# Patient Record
Sex: Male | Born: 1950
Health system: Southern US, Community
[De-identification: ages and names within clinical notes are randomized; demographics above are authoritative.]

## PROBLEM LIST (undated history)

## (undated) DIAGNOSIS — K3184 Gastroparesis: Secondary | ICD-10-CM

## (undated) DIAGNOSIS — K2270 Barrett's esophagus without dysplasia: Secondary | ICD-10-CM

## (undated) DIAGNOSIS — G919 Hydrocephalus, unspecified: Secondary | ICD-10-CM

## (undated) DIAGNOSIS — N183 Chronic kidney disease, stage 3 unspecified: Secondary | ICD-10-CM

## (undated) DIAGNOSIS — G20 Parkinson's disease: Secondary | ICD-10-CM

## (undated) DIAGNOSIS — Z8614 Personal history of Methicillin resistant Staphylococcus aureus infection: Secondary | ICD-10-CM

## (undated) DIAGNOSIS — C159 Malignant neoplasm of esophagus, unspecified: Secondary | ICD-10-CM

## (undated) DIAGNOSIS — J189 Pneumonia, unspecified organism: Secondary | ICD-10-CM

## (undated) DIAGNOSIS — E119 Type 2 diabetes mellitus without complications: Secondary | ICD-10-CM

## (undated) DIAGNOSIS — F32A Depression, unspecified: Secondary | ICD-10-CM

## (undated) DIAGNOSIS — K219 Gastro-esophageal reflux disease without esophagitis: Secondary | ICD-10-CM

## (undated) DIAGNOSIS — D649 Anemia, unspecified: Secondary | ICD-10-CM

## (undated) DIAGNOSIS — F0390 Unspecified dementia without behavioral disturbance: Secondary | ICD-10-CM

## (undated) DIAGNOSIS — G4730 Sleep apnea, unspecified: Secondary | ICD-10-CM

## (undated) DIAGNOSIS — G20A1 Parkinson's disease without dyskinesia, without mention of fluctuations: Secondary | ICD-10-CM

## (undated) DIAGNOSIS — I10 Essential (primary) hypertension: Secondary | ICD-10-CM

## (undated) HISTORY — DX: Chronic kidney disease, stage 3 (moderate): N18.3

## (undated) HISTORY — DX: Essential (primary) hypertension: I10

## (undated) HISTORY — DX: Type 2 diabetes mellitus without complications: E11.9

## (undated) HISTORY — DX: Chronic kidney disease, stage 3 unspecified: N18.30

## (undated) HISTORY — DX: Personal history of Methicillin resistant Staphylococcus aureus infection: Z86.14

## (undated) HISTORY — DX: Barrett's esophagus without dysplasia: K22.70

## (undated) HISTORY — DX: Gastroparesis: K31.84

## (undated) HISTORY — DX: Malignant neoplasm of esophagus, unspecified: C15.9

## (undated) HISTORY — DX: Gastro-esophageal reflux disease without esophagitis: K21.9

## (undated) HISTORY — PX: OTHER SURGICAL HISTORY: SHX169

---

## 1998-03-15 ENCOUNTER — Encounter: Admission: RE | Admit: 1998-03-15 | Discharge: 1998-06-09 | Payer: Self-pay | Admitting: Internal Medicine

## 1998-03-16 ENCOUNTER — Encounter: Payer: Self-pay | Admitting: Endocrinology

## 1998-03-16 ENCOUNTER — Ambulatory Visit (HOSPITAL_COMMUNITY): Admission: RE | Admit: 1998-03-16 | Discharge: 1998-03-16 | Payer: Self-pay | Admitting: Endocrinology

## 1998-03-17 ENCOUNTER — Ambulatory Visit (HOSPITAL_COMMUNITY): Admission: RE | Admit: 1998-03-17 | Discharge: 1998-03-17 | Payer: Self-pay | Admitting: Endocrinology

## 1998-03-17 ENCOUNTER — Encounter: Payer: Self-pay | Admitting: Endocrinology

## 1998-09-06 ENCOUNTER — Encounter: Admission: RE | Admit: 1998-09-06 | Discharge: 1998-12-05 | Payer: Self-pay | Admitting: Internal Medicine

## 1998-09-20 ENCOUNTER — Ambulatory Visit (HOSPITAL_COMMUNITY): Admission: RE | Admit: 1998-09-20 | Discharge: 1998-09-20 | Payer: Self-pay | Admitting: *Deleted

## 1999-01-19 ENCOUNTER — Encounter: Admission: RE | Admit: 1999-01-19 | Discharge: 1999-04-19 | Payer: Self-pay | Admitting: Internal Medicine

## 1999-04-24 ENCOUNTER — Encounter: Admission: RE | Admit: 1999-04-24 | Discharge: 1999-07-23 | Payer: Self-pay | Admitting: Internal Medicine

## 1999-05-10 ENCOUNTER — Ambulatory Visit (HOSPITAL_COMMUNITY): Admission: RE | Admit: 1999-05-10 | Discharge: 1999-05-10 | Payer: Self-pay | Admitting: Cardiology

## 1999-06-05 ENCOUNTER — Encounter: Admission: RE | Admit: 1999-06-05 | Discharge: 1999-09-03 | Payer: Self-pay | Admitting: Orthopedic Surgery

## 1999-06-18 ENCOUNTER — Encounter: Admission: RE | Admit: 1999-06-18 | Discharge: 1999-09-16 | Payer: Self-pay | Admitting: Endocrinology

## 1999-08-27 ENCOUNTER — Encounter: Admission: RE | Admit: 1999-08-27 | Discharge: 1999-11-25 | Payer: Self-pay | Admitting: Orthopedic Surgery

## 1999-12-25 ENCOUNTER — Encounter: Admission: RE | Admit: 1999-12-25 | Discharge: 2000-03-24 | Payer: Self-pay | Admitting: Orthopedic Surgery

## 2000-04-01 ENCOUNTER — Encounter: Admission: RE | Admit: 2000-04-01 | Discharge: 2000-06-06 | Payer: Self-pay | Admitting: Orthopedic Surgery

## 2000-06-04 ENCOUNTER — Encounter: Admission: RE | Admit: 2000-06-04 | Discharge: 2000-08-25 | Payer: Self-pay | Admitting: Orthopedic Surgery

## 2000-09-10 ENCOUNTER — Encounter: Admission: RE | Admit: 2000-09-10 | Discharge: 2000-12-09 | Payer: Self-pay | Admitting: Internal Medicine

## 2000-09-10 ENCOUNTER — Encounter (HOSPITAL_BASED_OUTPATIENT_CLINIC_OR_DEPARTMENT_OTHER): Payer: Self-pay | Admitting: Internal Medicine

## 2001-02-18 ENCOUNTER — Encounter: Admission: RE | Admit: 2001-02-18 | Discharge: 2001-05-04 | Payer: Self-pay | Admitting: Internal Medicine

## 2001-05-21 ENCOUNTER — Encounter: Admission: RE | Admit: 2001-05-21 | Discharge: 2001-08-10 | Payer: Self-pay | Admitting: Orthopedic Surgery

## 2001-07-02 ENCOUNTER — Encounter: Payer: Self-pay | Admitting: Orthopedic Surgery

## 2001-07-30 ENCOUNTER — Encounter (HOSPITAL_BASED_OUTPATIENT_CLINIC_OR_DEPARTMENT_OTHER): Payer: Self-pay | Admitting: Internal Medicine

## 2001-08-25 ENCOUNTER — Encounter (HOSPITAL_BASED_OUTPATIENT_CLINIC_OR_DEPARTMENT_OTHER): Admission: RE | Admit: 2001-08-25 | Discharge: 2001-11-23 | Payer: Self-pay | Admitting: Orthopedic Surgery

## 2002-01-01 ENCOUNTER — Encounter (HOSPITAL_BASED_OUTPATIENT_CLINIC_OR_DEPARTMENT_OTHER): Admission: RE | Admit: 2002-01-01 | Discharge: 2002-04-01 | Payer: Self-pay | Admitting: Internal Medicine

## 2002-04-05 ENCOUNTER — Encounter (HOSPITAL_BASED_OUTPATIENT_CLINIC_OR_DEPARTMENT_OTHER): Admission: RE | Admit: 2002-04-05 | Discharge: 2002-07-04 | Payer: Self-pay | Admitting: Internal Medicine

## 2002-06-03 ENCOUNTER — Ambulatory Visit (HOSPITAL_BASED_OUTPATIENT_CLINIC_OR_DEPARTMENT_OTHER): Admission: RE | Admit: 2002-06-03 | Discharge: 2002-06-03 | Payer: Self-pay | Admitting: Orthopedic Surgery

## 2002-08-02 ENCOUNTER — Encounter (HOSPITAL_BASED_OUTPATIENT_CLINIC_OR_DEPARTMENT_OTHER): Admission: RE | Admit: 2002-08-02 | Discharge: 2002-10-31 | Payer: Self-pay | Admitting: Internal Medicine

## 2002-11-03 ENCOUNTER — Encounter (HOSPITAL_BASED_OUTPATIENT_CLINIC_OR_DEPARTMENT_OTHER): Admission: RE | Admit: 2002-11-03 | Discharge: 2003-02-01 | Payer: Self-pay | Admitting: Internal Medicine

## 2003-02-04 ENCOUNTER — Encounter (HOSPITAL_BASED_OUTPATIENT_CLINIC_OR_DEPARTMENT_OTHER): Admission: RE | Admit: 2003-02-04 | Discharge: 2003-02-15 | Payer: Self-pay | Admitting: Internal Medicine

## 2003-05-05 ENCOUNTER — Encounter (HOSPITAL_BASED_OUTPATIENT_CLINIC_OR_DEPARTMENT_OTHER): Admission: RE | Admit: 2003-05-05 | Discharge: 2003-05-20 | Payer: Self-pay | Admitting: Internal Medicine

## 2003-08-04 ENCOUNTER — Encounter (HOSPITAL_BASED_OUTPATIENT_CLINIC_OR_DEPARTMENT_OTHER): Admission: RE | Admit: 2003-08-04 | Discharge: 2003-08-11 | Payer: Self-pay | Admitting: Internal Medicine

## 2003-11-01 ENCOUNTER — Encounter (HOSPITAL_BASED_OUTPATIENT_CLINIC_OR_DEPARTMENT_OTHER): Admission: RE | Admit: 2003-11-01 | Discharge: 2003-11-10 | Payer: Self-pay | Admitting: Internal Medicine

## 2003-12-07 ENCOUNTER — Encounter (HOSPITAL_BASED_OUTPATIENT_CLINIC_OR_DEPARTMENT_OTHER): Admission: RE | Admit: 2003-12-07 | Discharge: 2004-03-06 | Payer: Self-pay | Admitting: Internal Medicine

## 2004-02-07 ENCOUNTER — Ambulatory Visit (HOSPITAL_COMMUNITY): Admission: RE | Admit: 2004-02-07 | Discharge: 2004-02-07 | Payer: Self-pay

## 2004-02-23 ENCOUNTER — Encounter: Admission: RE | Admit: 2004-02-23 | Discharge: 2004-02-23 | Payer: Self-pay | Admitting: *Deleted

## 2004-03-27 ENCOUNTER — Encounter (INDEPENDENT_AMBULATORY_CARE_PROVIDER_SITE_OTHER): Payer: Self-pay | Admitting: *Deleted

## 2004-03-27 ENCOUNTER — Ambulatory Visit (HOSPITAL_COMMUNITY): Admission: RE | Admit: 2004-03-27 | Discharge: 2004-03-27 | Payer: Self-pay | Admitting: *Deleted

## 2004-04-17 ENCOUNTER — Encounter (HOSPITAL_BASED_OUTPATIENT_CLINIC_OR_DEPARTMENT_OTHER): Admission: RE | Admit: 2004-04-17 | Discharge: 2004-06-04 | Payer: Self-pay | Admitting: Internal Medicine

## 2004-04-18 ENCOUNTER — Ambulatory Visit (HOSPITAL_COMMUNITY): Admission: RE | Admit: 2004-04-18 | Discharge: 2004-04-18 | Payer: Self-pay | Admitting: Thoracic Surgery

## 2004-04-24 ENCOUNTER — Ambulatory Visit (HOSPITAL_COMMUNITY): Admission: RE | Admit: 2004-04-24 | Discharge: 2004-04-24 | Payer: Self-pay | Admitting: Thoracic Surgery

## 2004-04-29 ENCOUNTER — Ambulatory Visit: Payer: Self-pay | Admitting: Pulmonary Disease

## 2004-04-29 ENCOUNTER — Inpatient Hospital Stay (HOSPITAL_COMMUNITY): Admission: RE | Admit: 2004-04-29 | Discharge: 2004-05-14 | Payer: Self-pay | Admitting: Thoracic Surgery

## 2004-04-29 ENCOUNTER — Ambulatory Visit: Payer: Self-pay | Admitting: Internal Medicine

## 2004-04-30 ENCOUNTER — Encounter (INDEPENDENT_AMBULATORY_CARE_PROVIDER_SITE_OTHER): Payer: Self-pay | Admitting: *Deleted

## 2004-05-07 ENCOUNTER — Ambulatory Visit: Payer: Self-pay | Admitting: Internal Medicine

## 2004-05-21 ENCOUNTER — Emergency Department (HOSPITAL_COMMUNITY): Admission: EM | Admit: 2004-05-21 | Discharge: 2004-05-21 | Payer: Self-pay | Admitting: Emergency Medicine

## 2004-05-23 ENCOUNTER — Encounter: Admission: RE | Admit: 2004-05-23 | Discharge: 2004-05-23 | Payer: Self-pay | Admitting: Thoracic Surgery

## 2004-05-28 ENCOUNTER — Emergency Department (HOSPITAL_COMMUNITY): Admission: EM | Admit: 2004-05-28 | Discharge: 2004-05-28 | Payer: Self-pay | Admitting: Emergency Medicine

## 2004-06-27 ENCOUNTER — Encounter: Admission: RE | Admit: 2004-06-27 | Discharge: 2004-06-27 | Payer: Self-pay | Admitting: Thoracic Surgery

## 2004-08-24 ENCOUNTER — Encounter (HOSPITAL_BASED_OUTPATIENT_CLINIC_OR_DEPARTMENT_OTHER): Admission: RE | Admit: 2004-08-24 | Discharge: 2004-11-22 | Payer: Self-pay | Admitting: Surgery

## 2004-08-28 ENCOUNTER — Encounter
Admission: RE | Admit: 2004-08-28 | Discharge: 2004-08-28 | Payer: Self-pay | Admitting: Thoracic Surgery (Cardiothoracic Vascular Surgery)

## 2004-09-25 ENCOUNTER — Encounter (INDEPENDENT_AMBULATORY_CARE_PROVIDER_SITE_OTHER): Payer: Self-pay | Admitting: Specialist

## 2004-09-25 ENCOUNTER — Ambulatory Visit (HOSPITAL_COMMUNITY): Admission: RE | Admit: 2004-09-25 | Discharge: 2004-09-25 | Payer: Self-pay | Admitting: *Deleted

## 2004-09-28 ENCOUNTER — Ambulatory Visit: Payer: Self-pay | Admitting: Internal Medicine

## 2004-10-03 ENCOUNTER — Ambulatory Visit (HOSPITAL_COMMUNITY): Admission: RE | Admit: 2004-10-03 | Discharge: 2004-10-03 | Payer: Self-pay | Admitting: Internal Medicine

## 2004-11-30 ENCOUNTER — Encounter (HOSPITAL_BASED_OUTPATIENT_CLINIC_OR_DEPARTMENT_OTHER): Admission: RE | Admit: 2004-11-30 | Discharge: 2005-02-28 | Payer: Self-pay | Admitting: Surgery

## 2004-12-07 ENCOUNTER — Ambulatory Visit: Payer: Self-pay | Admitting: Internal Medicine

## 2004-12-10 ENCOUNTER — Ambulatory Visit (HOSPITAL_COMMUNITY): Admission: RE | Admit: 2004-12-10 | Discharge: 2004-12-10 | Payer: Self-pay | Admitting: Internal Medicine

## 2004-12-25 ENCOUNTER — Encounter: Admission: RE | Admit: 2004-12-25 | Discharge: 2004-12-25 | Payer: Self-pay | Admitting: Thoracic Surgery

## 2005-05-07 ENCOUNTER — Ambulatory Visit (HOSPITAL_COMMUNITY): Admission: RE | Admit: 2005-05-07 | Discharge: 2005-05-07 | Payer: Self-pay | Admitting: *Deleted

## 2005-05-07 ENCOUNTER — Encounter (INDEPENDENT_AMBULATORY_CARE_PROVIDER_SITE_OTHER): Payer: Self-pay | Admitting: *Deleted

## 2005-06-07 ENCOUNTER — Ambulatory Visit: Payer: Self-pay

## 2005-06-10 ENCOUNTER — Ambulatory Visit (HOSPITAL_COMMUNITY): Admission: RE | Admit: 2005-06-10 | Discharge: 2005-06-10 | Payer: Self-pay | Admitting: Internal Medicine

## 2005-06-10 ENCOUNTER — Ambulatory Visit: Payer: Self-pay | Admitting: Internal Medicine

## 2005-07-01 IMAGING — CR DG CHEST 1V PORT
1 series · 1 of 1 positions shown · non-contrast
Comparison: 2 view chest x-ray 05/23/2004.

CLINICAL DATA: History of esophageal cancer. The fever, chest pain.

PORTABLE CHEST - 1 VIEW  [DATE]/0442 [id number] hours:

[view not recorded]
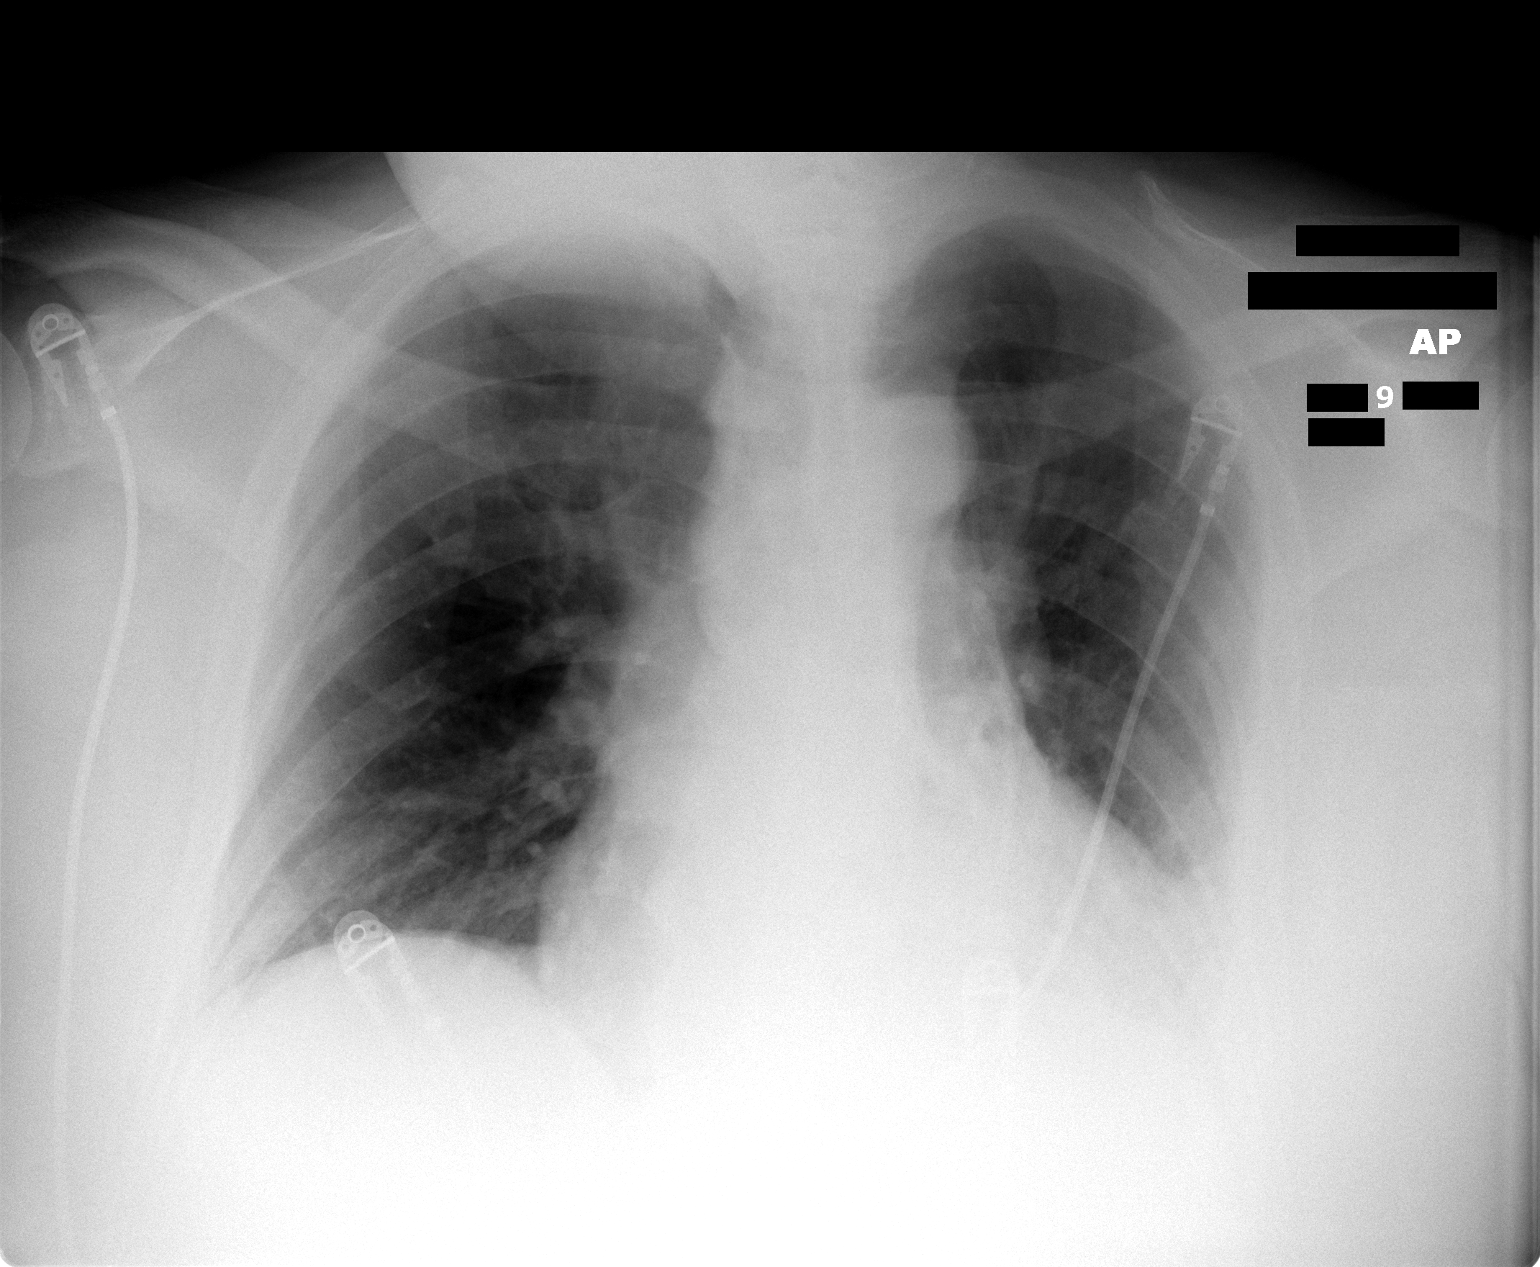

[1 of 1 positions shown; findings below may reference images not displayed]

FINDINGS: Again demonstrated and unchanged are the pleural and parenchymal
scarring in the left base. The lungs remain clear otherwise. The heart size is
upper normal and is accentuated by technique.
IMPRESSION: No evidence of acute disease.

## 2005-07-03 ENCOUNTER — Encounter: Admission: RE | Admit: 2005-07-03 | Discharge: 2005-07-03 | Payer: Self-pay | Admitting: Thoracic Surgery

## 2005-12-02 ENCOUNTER — Ambulatory Visit: Payer: Self-pay | Admitting: Internal Medicine

## 2005-12-02 LAB — CBC WITH DIFFERENTIAL/PLATELET
Basophils Absolute: 0 10*3/uL (ref 0.0–0.1)
Eosinophils Absolute: 0.3 10*3/uL (ref 0.0–0.5)
HCT: 37.7 % — ABNORMAL LOW (ref 38.7–49.9)
LYMPH%: 23.6 % (ref 14.0–48.0)
MCV: 86.9 fL (ref 81.6–98.0)
MONO#: 0.8 10*3/uL (ref 0.1–0.9)
MONO%: 9.4 % (ref 0.0–13.0)
NEUT#: 5.6 10*3/uL (ref 1.5–6.5)
NEUT%: 63.7 % (ref 40.0–75.0)
Platelets: 241 10*3/uL (ref 145–400)
RBC: 4.34 10*6/uL (ref 4.20–5.71)
WBC: 8.9 10*3/uL (ref 4.0–10.0)

## 2005-12-02 LAB — COMPREHENSIVE METABOLIC PANEL
CO2: 30 mEq/L (ref 19–32)
Glucose, Bld: 149 mg/dL — ABNORMAL HIGH (ref 70–99)
Sodium: 140 mEq/L (ref 135–145)
Total Bilirubin: 0.5 mg/dL (ref 0.3–1.2)
Total Protein: 7.2 g/dL (ref 6.0–8.3)

## 2005-12-02 LAB — LACTATE DEHYDROGENASE: LDH: 153 U/L (ref 94–250)

## 2005-12-04 ENCOUNTER — Ambulatory Visit: Admission: RE | Admit: 2005-12-04 | Discharge: 2005-12-04 | Payer: Self-pay | Admitting: Internal Medicine

## 2006-01-15 ENCOUNTER — Encounter: Admission: RE | Admit: 2006-01-15 | Discharge: 2006-01-15 | Payer: Self-pay | Admitting: Thoracic Surgery

## 2006-05-29 ENCOUNTER — Ambulatory Visit (HOSPITAL_COMMUNITY): Admission: RE | Admit: 2006-05-29 | Discharge: 2006-05-29 | Payer: Self-pay | Admitting: *Deleted

## 2006-05-29 ENCOUNTER — Encounter (INDEPENDENT_AMBULATORY_CARE_PROVIDER_SITE_OTHER): Payer: Self-pay | Admitting: *Deleted

## 2006-07-15 ENCOUNTER — Encounter: Admission: RE | Admit: 2006-07-15 | Discharge: 2006-07-15 | Payer: Self-pay | Admitting: Thoracic Surgery

## 2006-07-15 ENCOUNTER — Ambulatory Visit: Payer: Self-pay | Admitting: Thoracic Surgery

## 2006-07-26 ENCOUNTER — Emergency Department (HOSPITAL_COMMUNITY): Admission: EM | Admit: 2006-07-26 | Discharge: 2006-07-26 | Payer: Self-pay | Admitting: Emergency Medicine

## 2006-11-25 ENCOUNTER — Ambulatory Visit: Payer: Self-pay | Admitting: Internal Medicine

## 2006-11-27 LAB — COMPREHENSIVE METABOLIC PANEL
ALT: 12 U/L (ref 0–53)
Albumin: 4.2 g/dL (ref 3.5–5.2)
CO2: 29 mEq/L (ref 19–32)
Calcium: 9 mg/dL (ref 8.4–10.5)
Chloride: 102 mEq/L (ref 96–112)
Potassium: 4.5 mEq/L (ref 3.5–5.3)
Sodium: 139 mEq/L (ref 135–145)
Total Protein: 7.1 g/dL (ref 6.0–8.3)

## 2006-11-27 LAB — CBC WITH DIFFERENTIAL/PLATELET
BASO%: 0.3 % (ref 0.0–2.0)
HCT: 34.9 % — ABNORMAL LOW (ref 38.7–49.9)
MCHC: 34.4 g/dL (ref 32.0–35.9)
MONO#: 0.8 10*3/uL (ref 0.1–0.9)
NEUT%: 69.4 % (ref 40.0–75.0)
RBC: 4.03 10*6/uL — ABNORMAL LOW (ref 4.20–5.71)
WBC: 8.8 10*3/uL (ref 4.0–10.0)
lymph#: 1.7 10*3/uL (ref 0.9–3.3)

## 2006-11-27 LAB — CEA: CEA: 1.8 ng/mL (ref 0.0–5.0)

## 2006-12-01 ENCOUNTER — Ambulatory Visit (HOSPITAL_COMMUNITY): Admission: RE | Admit: 2006-12-01 | Discharge: 2006-12-01 | Payer: Self-pay | Admitting: Internal Medicine

## 2006-12-03 ENCOUNTER — Ambulatory Visit: Payer: Self-pay | Admitting: Thoracic Surgery

## 2007-02-18 ENCOUNTER — Ambulatory Visit (HOSPITAL_COMMUNITY): Admission: RE | Admit: 2007-02-18 | Discharge: 2007-02-18 | Payer: Self-pay | Admitting: *Deleted

## 2007-03-21 ENCOUNTER — Emergency Department (HOSPITAL_COMMUNITY): Admission: EM | Admit: 2007-03-21 | Discharge: 2007-03-21 | Payer: Self-pay | Admitting: Emergency Medicine

## 2007-03-24 ENCOUNTER — Emergency Department (HOSPITAL_COMMUNITY): Admission: EM | Admit: 2007-03-24 | Discharge: 2007-03-24 | Payer: Self-pay | Admitting: Emergency Medicine

## 2007-05-26 ENCOUNTER — Ambulatory Visit: Payer: Self-pay | Admitting: Internal Medicine

## 2007-05-28 LAB — CBC WITH DIFFERENTIAL/PLATELET
BASO%: 0.3 % (ref 0.0–2.0)
EOS%: 4.2 % (ref 0.0–7.0)
MCH: 29.3 pg (ref 28.0–33.4)
MCHC: 34 g/dL (ref 32.0–35.9)
MONO%: 9.5 % (ref 0.0–13.0)
NEUT%: 65.4 % (ref 40.0–75.0)
RDW: 14.8 % — ABNORMAL HIGH (ref 11.2–14.6)
lymph#: 1.6 10*3/uL (ref 0.9–3.3)

## 2007-05-28 LAB — COMPREHENSIVE METABOLIC PANEL
Albumin: 4.3 g/dL (ref 3.5–5.2)
CO2: 28 mEq/L (ref 19–32)
Calcium: 9.6 mg/dL (ref 8.4–10.5)
Glucose, Bld: 143 mg/dL — ABNORMAL HIGH (ref 70–99)
Potassium: 4.5 mEq/L (ref 3.5–5.3)
Sodium: 139 mEq/L (ref 135–145)
Total Protein: 7.4 g/dL (ref 6.0–8.3)

## 2007-05-28 LAB — CEA: CEA: 1.1 ng/mL (ref 0.0–5.0)

## 2007-05-29 ENCOUNTER — Ambulatory Visit (HOSPITAL_COMMUNITY): Admission: RE | Admit: 2007-05-29 | Discharge: 2007-05-29 | Payer: Self-pay | Admitting: Internal Medicine

## 2007-06-03 ENCOUNTER — Ambulatory Visit: Payer: Self-pay | Admitting: Thoracic Surgery

## 2007-06-03 ENCOUNTER — Encounter: Admission: RE | Admit: 2007-06-03 | Discharge: 2007-06-03 | Payer: Self-pay | Admitting: Thoracic Surgery

## 2007-07-10 ENCOUNTER — Encounter (INDEPENDENT_AMBULATORY_CARE_PROVIDER_SITE_OTHER): Payer: Self-pay | Admitting: *Deleted

## 2007-07-10 ENCOUNTER — Ambulatory Visit (HOSPITAL_COMMUNITY): Admission: RE | Admit: 2007-07-10 | Discharge: 2007-07-10 | Payer: Self-pay | Admitting: *Deleted

## 2007-07-14 ENCOUNTER — Encounter: Admission: RE | Admit: 2007-07-14 | Discharge: 2007-07-14 | Payer: Self-pay | Admitting: Endocrinology

## 2007-08-28 ENCOUNTER — Inpatient Hospital Stay (HOSPITAL_COMMUNITY): Admission: EM | Admit: 2007-08-28 | Discharge: 2007-09-01 | Payer: Self-pay | Admitting: Emergency Medicine

## 2007-09-03 ENCOUNTER — Ambulatory Visit: Payer: Self-pay | Admitting: Internal Medicine

## 2007-10-19 ENCOUNTER — Encounter: Admission: RE | Admit: 2007-10-19 | Discharge: 2007-10-19 | Payer: Self-pay | Admitting: Endocrinology

## 2008-05-27 ENCOUNTER — Ambulatory Visit: Payer: Self-pay | Admitting: Internal Medicine

## 2008-05-31 ENCOUNTER — Ambulatory Visit (HOSPITAL_COMMUNITY): Admission: RE | Admit: 2008-05-31 | Discharge: 2008-05-31 | Payer: Self-pay | Admitting: Internal Medicine

## 2008-05-31 LAB — COMPREHENSIVE METABOLIC PANEL
AST: 21 U/L (ref 0–37)
Albumin: 4.2 g/dL (ref 3.5–5.2)
Alkaline Phosphatase: 142 U/L — ABNORMAL HIGH (ref 39–117)
BUN: 23 mg/dL (ref 6–23)
Glucose, Bld: 115 mg/dL — ABNORMAL HIGH (ref 70–99)
Potassium: 4 mEq/L (ref 3.5–5.3)
Sodium: 136 mEq/L (ref 135–145)
Total Bilirubin: 0.9 mg/dL (ref 0.3–1.2)

## 2008-05-31 LAB — CBC WITH DIFFERENTIAL/PLATELET
Basophils Absolute: 0 10*3/uL (ref 0.0–0.1)
EOS%: 4.2 % (ref 0.0–7.0)
LYMPH%: 18.2 % (ref 14.0–48.0)
MCH: 29.6 pg (ref 28.0–33.4)
MCV: 88.3 fL (ref 81.6–98.0)
MONO%: 8.8 % (ref 0.0–13.0)
Platelets: 203 10*3/uL (ref 145–400)
RBC: 3.85 10*6/uL — ABNORMAL LOW (ref 4.20–5.71)
RDW: 14.6 % (ref 11.2–14.6)

## 2008-07-12 ENCOUNTER — Encounter (INDEPENDENT_AMBULATORY_CARE_PROVIDER_SITE_OTHER): Payer: Self-pay | Admitting: *Deleted

## 2008-07-12 ENCOUNTER — Ambulatory Visit (HOSPITAL_COMMUNITY): Admission: RE | Admit: 2008-07-12 | Discharge: 2008-07-12 | Payer: Self-pay | Admitting: *Deleted

## 2009-06-06 ENCOUNTER — Ambulatory Visit (HOSPITAL_COMMUNITY): Admission: RE | Admit: 2009-06-06 | Discharge: 2009-06-06 | Payer: Self-pay | Admitting: Internal Medicine

## 2009-06-06 ENCOUNTER — Ambulatory Visit: Payer: Self-pay | Admitting: Internal Medicine

## 2009-06-06 LAB — COMPREHENSIVE METABOLIC PANEL
ALT: 16 U/L (ref 0–53)
Alkaline Phosphatase: 124 U/L — ABNORMAL HIGH (ref 39–117)
BUN: 22 mg/dL (ref 6–23)
Chloride: 104 mEq/L (ref 96–112)
Creatinine, Ser: 1.35 mg/dL (ref 0.40–1.50)
Glucose, Bld: 132 mg/dL — ABNORMAL HIGH (ref 70–99)
Potassium: 4.1 mEq/L (ref 3.5–5.3)
Total Bilirubin: 1.2 mg/dL (ref 0.3–1.2)

## 2009-06-06 LAB — CBC WITH DIFFERENTIAL/PLATELET
BASO%: 0.4 % (ref 0.0–2.0)
EOS%: 4.7 % (ref 0.0–7.0)
HCT: 37.1 % — ABNORMAL LOW (ref 38.4–49.9)
HGB: 12.4 g/dL — ABNORMAL LOW (ref 13.0–17.1)
MCHC: 33.3 g/dL (ref 32.0–36.0)
MCV: 90 fL (ref 79.3–98.0)
NEUT#: 4.8 10*3/uL (ref 1.5–6.5)
Platelets: 193 10*3/uL (ref 140–400)
RBC: 4.12 10*6/uL — ABNORMAL LOW (ref 4.20–5.82)
lymph#: 1.5 10*3/uL (ref 0.9–3.3)

## 2009-06-06 LAB — CEA: CEA: 2.5 ng/mL (ref 0.0–5.0)

## 2010-05-24 ENCOUNTER — Encounter
Admission: RE | Admit: 2010-05-24 | Discharge: 2010-05-24 | Payer: Self-pay | Source: Home / Self Care | Attending: Gastroenterology | Admitting: Gastroenterology

## 2010-09-04 LAB — GLUCOSE, CAPILLARY: Glucose-Capillary: 149 mg/dL — ABNORMAL HIGH (ref 70–99)

## 2010-10-02 NOTE — Op Note (Signed)
NAME:  [name], [name] NO.:  [id number]   MEDICAL RECORD NO.:  [id number]          PATIENT TYPE:  INP   LOCATION:  [id number]                         FACILITY:  [facility]   PHYSICIAN:  Waverly Ferrari, M.D.    DATE OF BIRTH:  April 28, 1951   DATE OF PROCEDURE:  08/31/2007  DATE OF DISCHARGE:                               OPERATIVE REPORT   PROCEDURE:  Colonoscopy.   INDICATIONS:  Weight loss.   ANESTHESIA:  Demerol 70 mg, Versed 10 mg.   PROCEDURE:  With the patient mildly sedated in the left lateral  decubitus position, a rectal exam was performed.  Prostate was normal.  Subsequently, the Pentax videoscopic colonoscope was inserted in the  rectum and passed under direct vision to the cecum, identified by  ileocecal valve and appendiceal orifice both of which were photographed  from this point.  Colonoscope was slowly withdrawn taking  circumferential views of colonic mucosa stopping only in the rectum,  which appeared normal on direct and showed hemorrhoids on retroflexed  view.  The endoscope was straightened and withdrawn.  The patient's  vital signs, pulse oximeter remained stable.  The patient tolerated the  procedure well without any apparent complications.   FINDINGS:  Internal hemorrhoids.  Otherwise, an unremarkable  examination.  Etiology of weight loss not apparent from this exam.   PLAN:  Have patient follow up with me on an as needed basis as an  outpatient.           ______________________________  Waverly Ferrari, M.D.     GMO/MEDQ  D:  08/31/2007  T:  09/01/2007  Job:  [id number]

## 2010-10-02 NOTE — Consult Note (Signed)
NAME:  [name], [name] NO.:  [id number]   MEDICAL RECORD NO.:  [id number]          PATIENT TYPE:  INP   LOCATION:  [id number]                         FACILITY:  [facility]   PHYSICIAN:  Imogene Burn. Georgette Dover, M.D. DATE OF BIRTH:  06/27/1950   DATE OF CONSULTATION:  08/28/2007  DATE OF DISCHARGE:                                 CONSULTATION   REASON FOR CONSULTATION:  Left abdominal wall abscess.   The patient is a 60 year old male with a history of esophageal cancer,  who presents with poor appetite, weight loss, constipation, and  abdominal wall tenderness for the last 3-4 days.  The patient was  evaluated by the emergency department and a CT scan showed a left  abdominal wall abscess.  Incision and incision and drainage was  performed by Dr. Mayra Neer of the emergency department.  The  patient states that the wound feels better.  We are asked to assist with  further wound management.   PAST MEDICAL HISTORY:  1. Esophageal adenocarcinoma status post esophagogastrectomy,      jejunostomy, pyloroplasty by Dr. Kathrin Penner status post recent      EGD by Dr. Lajoyce Corners.  2. Gastroesophageal reflux, Barrett's esophagus.  3. Type 2 diabetes.  4. Hypertension.  5. History of MRSA.   ALLERGIES:  FENTANYL and MORPHINE.   MEDICATIONS:  Aspirin, Januvia,  Centrum, multivitamin daily, Nexium,  Norvasc, metformin, Accuretic, glipizide, Reglan, Darvocet, Zoloft,  Actos.   SOCIAL HISTORY:  Nonsmoker, nondrinker.   FAMILY HISTORY:  Mother is deceased from metastatic breast carcinoma.  Father deceased coronary artery disease.   PHYSICAL EXAMINATION:  VITAL SIGNS:  Current temperature 98.1, pulse 78,  respirations 20, blood pressure 136/79, sats 96% on room air.  GENERAL:  A well-developed, well-nourished male in no apparent distress.  HEENT:  EOMI.  Sclerae anicteric.  NECK:  No masses, no thyromegaly.  LUNGS:  Clear to auscultation bilaterally.  No respiratory effort.  HEART:   Regular rate and rhythm.  No murmur.  ABDOMEN:  Well-healed midline incision.  The left lower anterior  abdominal wall shows an abscess which has been drained.  The opening is  only about 7 mm long.  There is some purulent fluid coming out.  The  patient states that the tenderness is much improved.  He has good bowel  sounds.  EXTREMITIES:  No edema.  SKIN:  Warm, dry with no sign of jaundice.   LABORATORY DATA:  White count 10.3.  Electrolytes within normal limits.   IMPRESSION:  Left lower abdominal wall abscess drained by emergency  department.  Cultures pending.  The wound opening is fairly small, so  there is a risk that it may some close prematurely and the patient may  have recurrence of his symptoms.  At this point, we will observe this  and do daily packing changes.  Hopefully, this will allow the wound to  granulate and heal from inside out.  Continue antibiotics.  Other  medical issue has been worked up by his primary team.      Imogene Burn. Tsuei, M.D.  Electronically Signed  MKT/MEDQ  D:  08/28/2007  T:  08/29/2007  Job:  [id number]

## 2010-10-02 NOTE — Discharge Summary (Signed)
NAME:  [name], [name] NO.:  [id number]   MEDICAL RECORD NO.:  [id number]          PATIENT TYPE:  INP   LOCATION:  5507                         FACILITY:  [facility]   PHYSICIAN:  [name], D.O.    DATE OF BIRTH:  July 04, 1950   DATE OF ADMISSION:  08/27/2007  DATE OF DISCHARGE:  09/01/2007                               DISCHARGE SUMMARY   PRIMARY CARE PHYSICIAN:  W.D. Wilson Singer, MD.   PRIMARY ONCOLOGIST:  Ronald Bien. Julien Mccarthy, [name] SURGEON:  Ronald Alcon, MD.   GASTROENTEROLOGIST:  Ronald Ferrari, MD.   FINAL DIAGNOSES:  1. Abdominal wall abscess, status post incision and drainage, and      followup care by general surgery with recommendations to follow up      with [name] in 2 weeks and followup with [name] within      1-2 weeks as well and to undergo daily dressing changes as      instruction while he was in the hospital.  2. Weight loss, status post colonoscopy by Dr. Lajoyce Mccarthy.  Findings      revealing internal hemorrhoids, otherwise unremarkable bowel      examination.  No explanation for weight loss from current exam.  3. Diabetes is uncontrolled.  His hemoglobin A1c is 6.6.  He can      undergo further optimization of his medical management through his      primary [name]  Dr. Wilson Mccarthy.  We will continue his current regimen.  4. Hypertension.  5. Status post esophagectomy secondary to cancer.   DISCHARGE MEDICATIONS:  He will continue his home medications as per  list.  He provided on his own account:  1. Accuretic 20/12.5 daily.  2. Aspirin 81 mg daily.  3. Darvocet-N 100 q.6 p.r.n.  4. Glipizide 10 mg daily.  5. Metformin 1200 mg twice daily.  6. Metoclopramide 10 mg 1 tablet t.i.d.  7. Multivitamin daily.  8. Nexium 40 mg twice a day.  9. Norvasc 0.5 mg daily.  10.Phenergan 12.5 mg p.r.n. q.4 h. nausea.  11.Zoloft 100 mg q.a.m.  12.Actos 30 mg daily.  13.Doxycycline 100 mg twice daily, dispensed #28.   DISPOSITION:  Mr.  Mccarthy is in medically stable and an improved  condition after followup with [name] of general surgery in 1-2 weeks  for wound care followup and [name] in 1-2 weeks as well.   CONSULTATIONS ON THIS ADMISSION:  Ronald Desanctis, MD, of Gastroenterology.   PROCEDURE PERFORMED:  Colonoscopy; for full details, please refer to the  report.   IMAGING STUDIES:  CT of his chest revealed:  1. No evidence of recurrence.  2. Stable appearance of gastric pull-through.  No mediastinal or hilar      adenopathy.  3. Stable areas of scarring, particularly in the anterior left lobe.   Abdomen and pelvis revealed soft tissue abscess localized in the left  anterior abdominal wall subcutaneous tissue, no involvement of the  underlying musculature, no intra-abdominal fluid.   LABORATORY DATA:  Laboratory data revealed a vancomycin level of 24.2.  His  vancomycin is being discontinued.  He can undergo followup through  his primary care physician __________ basic metabolic panel.  It is  anticipated that without further treatment with the vancomycin, this  level should normalize.   MOST RECENT LAB DATA:  Sodium 135, potassium 3.7, BUN 8, and creatinine  0.98.   CULTURE RESULTS ON THE ABSCESS:  Coag-negative staph was revealed.   HISTORY OF PRESENT ILLNESS:  For full details, please refer to the H&P  as dictated by Dr. Sherryl Mccarthy,  however, briefly, [name] is  a pleasant 60 year old male with a prior history of esophageal  adenocarcinoma, T1 N0 diagnosed in December 2005, status post  esophagogastrectomy who was seen by his primary care physician with  abdominal wall pain.  He went to the emergency department, was  discovered to have a left anterior abdominal wall abscess.  This was  incised and drained in the emergency department where he was admitted  for further management.   HOSPITAL COURSE:  He underwent workup and underwent imaging studies as  described above with the  findings as described above.  Also,  [name] Surgery consulted for wound care followup of his abscess and  daily packing and dressing changes were continued.  It was recommended  that he undergo 2 weeks of outpatient antibiotics.  He was also asked to  follow up with Wound Care check by [name] in the outpatient setting.  He was instructed to call or request his PCP to coordinate these  followups.      [name], D.O.  Electronically Signed     ESS/MEDQ  D:  09/01/2007  T:  09/02/2007  Job:  [id number]

## 2010-10-02 NOTE — Op Note (Signed)
NAME:  [name], MILUM NO.:  [id number]   MEDICAL RECORD NO.:  [id number]          PATIENT TYPE:  AMB   LOCATION:  ENDO                         FACILITY:  [facility]   PHYSICIAN:  Waverly Ferrari, M.D.    DATE OF BIRTH:  Jul 02, 1950   DATE OF PROCEDURE:  07/10/2007  DATE OF DISCHARGE:                               OPERATIVE REPORT   PROCEDURE:  Upper endoscopy.   INDICATIONS:  Gastroesophageal reflux disease with Barrett's esophagus  and previous adenocarcinoma of the esophagus.   ANESTHESIA:  Demerol 70 mg and Versed 7.5 mg.   DESCRIPTION OF PROCEDURE:  With the patient mildly sedated in the left  lateral decubitus position, the Pentax videoscopic endoscope was  inserted in the mouth and passed under direct vision through the  esophagus which appeared normal.  There was no clear cut evidence of  Barrett's esophagus but there was a rim of red tissue that I elected to  biopsy at the squamocolumnar junction.  This was photographed and  biopsied.  We entered into the stomach.  The fundus, body, antrum,  duodenal bulb, and second portion of the duodenum were visualized. From  this point, the endoscope was slowly withdrawn taking circumferential  views of the duodenal mucosa until the endoscope had been pulled back  into the stomach and placed in retroflexion to view the stomach from  below. The endoscope was then straightened and withdrawn taking  circumferential views of the remaining gastric and esophageal mucosa,  stopping in the antrum to biopsy some erythematous patches seen.  The  endoscope was withdrawn.  The patient's vital signs and pulse oximeter  remained stable.  The patient tolerated the procedure well without  apparent complications.   FINDINGS:  Mild erythema of the gastric antrum, biopsied, and biopsies  taken of the squamocolumnar junction.   PLAN:  Await biopsy reports.  The patient will call me for results and  follow up with me as an  outpatient.           ______________________________  Waverly Ferrari, M.D.     GMO/MEDQ  D:  07/10/2007  T:  07/10/2007  Job:  [id number]

## 2010-10-02 NOTE — Letter (Signed)
December 05, 2006   Eilleen Kempf, [address], [place] [address]   Re:  [name], [name]             DOB:  Apr 10, 1951   [name],   I saw the patient in the office today and he is doing well.  He has had  a recent CT that showed no evidence of recurrence.  He is now over 2-1/2  since his esophagogastrectomy.  His weight is stable and he is back to  full time employment.  We will see him again in 6 months with another CT  scan.   Sincerely,   Nicanor Alcon, M.D.  Electronically Signed   DPB/MEDQ  D:  12/05/2006  T:  12/06/2006  Job:  [id number]

## 2010-10-02 NOTE — Letter (Signed)
June 03, 2007   Eilleen Kempf, [address], [place] [address]   Re:  [name], [name]             DOB:  Apr 19, 1951   Dear Julien Nordmann:   I saw the patient in the office today.  He is now 3 years since his  surgery.  His recent CT scan is stable with no evidence of recurrence.  Chest x-ray today was also clear.  His blood pressure is 138/58, pulse  82, respirations 18, sats were 98%, weight was 285.  Overall, he is  doing well.  Since he is stable, I will just let you continue to follow  him, and I will be happy to see him again if there is any evidence of  recurrence.  I appreciate the opportunity of seeing the patient.   Nicanor Alcon, M.D.  Electronically Signed   DPB/MEDQ  D:  06/03/2007  T:  06/03/2007  Job:  [id number]   cc:   Waverly Ferrari, M.D.

## 2010-10-02 NOTE — Op Note (Signed)
NAME:  MUAD, KIRCH NO.:  [id number]   MEDICAL RECORD NO.:  [id number]          PATIENT TYPE:  AMB   LOCATION:  ENDO                         FACILITY:  [facility]   PHYSICIAN:  Waverly Ferrari, M.D.    DATE OF BIRTH:  [birth year]   DATE OF PROCEDURE:  DATE OF DISCHARGE:                               OPERATIVE REPORT   PROCEDURE:  Upper endoscopy.   INDICATIONS:  GERD with known Barrett's esophagus and previous  adenocarcinoma of the esophagus status post resection.   ANESTHESIA:  Demerol 70 mg, Versed 7.5 mg.   PROCEDURE:  With the patient mildly sedated in the left lateral  decubitus position the Pentax videoscopic endoscope was inserted in the  mouth and passed under direct vision through the esophagus which  appeared normal without any evidence of Barrett's noted.  We entered  into the stomach fundus, body, antrum, duodenal bulb, second portion  duodenum were visualized.  From this point the endoscope was slowly  withdrawn taking circumferential views of the duodenal mucosa until the  endoscope had been pulled back into stomach and placed in retroflexion  to view the stomach from below.  The endoscope was then straightened and  withdrawn taking circumferential views of the remaining gastric and  esophageal mucosa stopping to biopsy the distal esophagus at the  squamocolumnar junction.  The patient's vital signs and pulse oximeter  remained stable.  The patient tolerated the procedure well without  apparent complications.   FINDINGS:  Probably normal examination status post resection.   PLAN:  Await biopsy report.  The patient will call me for results and  follow-up with me as an outpatient.           ______________________________  Waverly Ferrari, M.D.     GMO/MEDQ  D:  07/12/2008  T:  07/12/2008  Job:  [id number]

## 2010-10-02 NOTE — H&P (Signed)
NAME:  [name], [name] NO.:  [id number]   MEDICAL RECORD NO.:  [id number]          PATIENT TYPE:  INP   LOCATION:  5507                         FACILITY:  [facility]   PHYSICIAN:  Sherryl Manges, M.D.  DATE OF BIRTH:  [birth year]   DATE OF ADMISSION:  08/27/2007  DATE OF DISCHARGE:                              HISTORY & PHYSICAL   PRIMARY PHYSICIAN:  Primary M.D: Dr. Wilson Singer.  Primary Oncologist: Dr. Eilleen Kempf.  Primary Cardiothoracic Surgeon: Dr. Marlyn Corporal.  Primary Gastroenterologist: Dr. Jim Desanctis.   CHIEF COMPLAINT:  Weight loss of approximately 60 pounds in the past 3  months, poor appetite and altered bowel habit, in the form of  constipation with occasional diarrhea. Also, anterior abdominal wall  pain for the past 3 to 4 days.  No pyrexia.   HISTORY OF PRESENT ILLNESS:  As above.  The patient saw his primary  M.D.,  Dr. Wilson Singer today, per scheduled appointment.  Complained of  abdominal wall pain, was evaluated and sent to the Emergency Department.  In addition, he informs me that Dr. Lajoyce Corners was planning to schedule a  colonoscopy soon to evaluate his weight loss.  On arrival in the  Emergency Department, I&D of left anterior abdominal wall abscess was  done by ED MD, then the patient was therefore referred to the medical  service for admission, treatment and further evaluation.   PAST MEDICAL HISTORY:  1. Esophageal adenocarcinoma (T1 N0 Mx) April 19, 2004, status post      esophagogastrectomy with jejunostomy and pyloroplasty.  Follows up      with Dr. Marlyn Corporal and Dr. Lajoyce Corners, now status post multiple upper      GI endoscopies, and subsequently declared disease-free after 3      years of follow-up.  2. GERD/Barrett's esophagus.  3. Type 2 diabetes mellitus.  4. Hypertension.  5. History of MRSA.   MEDICATION HISTORY.:  1. Aspirin 81 mg p.o. daily.  2. Januvia 100 mg p.o. daily.  3. Centrum multivitamin one p.o. daily.  4. Nexium 40 mg  p.o. b.i.d.  5. Norvasc 5 mg p.o. daily.  6. Metformin 500 mg p.o. b.i.d.  7. Accuretic (20/12.5) one p.o. daily.  8. Glipizide 10 mg p.o. daily.  9. Metoclopramide 10 mg p.o. t.i.d.  10.Darvocet N 100 one p.o. p.r.n. daily.  11.Zoloft 100 mg p.o. daily.  12.Actos 30 mg p.o. daily.   ALLERGIES:  FENTANYL, MORPHINE.   REVIEW OF SYSTEMS:  As per HPI and chief complaint, otherwise negative.   SOCIAL HISTORY:  The patient is married.  He works as a Company secretary.  Has  one son who is now deceased, after sustaining a gunshot wound at age 19  years.  Nonsmoker, nondrinker.  Has no history of drug abuse.   FAMILY HISTORY:  The patient's mother died of carcinoma of the breast  with metastases. Also his father is deceased.  He had coronary artery  disease and CHF at age 46 years.  Family history is otherwise  noncontributory.   PHYSICAL EXAMINATION:  VITALS:  Temperature maximum 101.1, pulse 75 per  minute regular,  respiratory rate 18, BP 112/78 mmHg, pulse oximeter 100%  on room air.  GENERAL:  The patient did not appear to be in obvious acute distress at  time of this evaluation, alert, communicative, not short of breath at  rest.  HEENT:  No clinical pallor, no jaundice.  No conjunctival injection.  Throat is clear.  NECK: Supple.  JVP not seen a palpable lymphadenopathy.  No palpable  goiter.  CHEST:  Clinically clear to auscultation.  No wheezes, no crackles.  CARDIOVASCULAR:  Heart sounds 1 and 2 are heard. Normal, regular, no  murmurs.  ABDOMEN: Midline laparotomy scar is noted.  The patient has dressing  with some sanguineous drainage on it over the left anterior abdominal  wall, i.e. site of I&D done in the emergency department.  There is  however, some perifocal redness and tenderness.  Abdomen otherwise,  appears soft.  Bowel sounds are heard.  No palpable organomegaly.  No  palpable masses.  EXTREMITIES:  Lower extremity examination, no pitting edema.  Palpable  peripheral  pulses.  MUSCULOSKELETAL  SYSTEM:  Unremarkable.  CENTRAL NERVOUS SYSTEM:  No focal neurologic deficit on gross  examination.   INVESTIGATIONS:  WBC 11.8, hemoglobin 12.8, hematocrit 38.4, platelets  229.  Electrolytes sodium 120, potassium 4.8, chloride 95.  CO2 21, BUN  21, creatinine 1.17, glucose 59, AST 25, ALT 21, alkaline phosphatase  93.  Abdominal CT scan dated August 27, 2007  shows soft tissue abscess  4.8 cm in diameter, left anterior abdominal wall subcutaneous tissues.  No involvement of underlying musculature no intra-abdominal fluid.   ASSESSMENT AND PLAN:  1. Left anterior abdominal wall abscess with cellulitis.  Now status      post I&D done by ED MD.  We shall admit the patient, administer      broad-spectrum antibiotic coverage with a combination of Vancomycin      and Zosyn, especially in view of known history of MRSA. We shall      await wound cultures.  Meanwhile do blood cultures for      completeness.   1. Type 2 diabetes mellitus.  This appears controlled.  However,      random blood glucose somewhat low at 59, and we shall therefore,      hold oral hypoglycemics, place the patient on a carbohydrate      modified diet and manage with sliding scale insulin coverage for      now.   1. History of weight loss.  The patient has lost approximately 60      pounds in weight over the past 3 months.  He has had multiple      unrevealing upper GI endoscopies done by Dr. Jim Desanctis and we      understand that a colonoscopy is planned, particularly against a      background of altered bowel habits.  We shall consult Dr. Lajoyce Corners to do      colonoscopy during this hospitalization. Abdominal CT scan however,      is unrevealing.   1. Hypertension.  This appears controlled.   1. Barrett's esophagus.  We shall continue twice-daily proton pump      inhibitor.   1. Hyponatremia.  This is likely secondary to the Hydrochlorothiazide      moiety of Accuretic. We shall discontinue  this and commence      intravenous infusion of normal saline, for now.   Further management will depend on clinical course.      Harrell Gave  Blenda Nicely, M.D.  Electronically Signed     CO/MEDQ  D:  08/28/2007  T:  08/28/2007  Job:  [id number]   cc:   Waverly Ferrari, M.D.  Nicanor Alcon, M.D.  Eilleen Kempf, MD

## 2010-10-05 NOTE — Op Note (Signed)
NAME:  [name], [name]                       ACCOUNT NO.:  [id number]   MEDICAL RECORD NO.:  [id number]                   PATIENT TYPE:  AMB   LOCATION:  [place]                                  FACILITY:  [facility]   PHYSICIAN:  Newt Minion, M.D.                DATE OF BIRTH:  01-04-1951   DATE OF PROCEDURE:  06/03/2002  DATE OF DISCHARGE:                                 OPERATIVE REPORT   PREOPERATIVE DIAGNOSIS:  Chronic ulcer, left second toe with hyperkeratotic  callous under the second metatarsal head with chronic clawing of the  proximal interphalangeal joint and prominent first metatarsal head.   PROCEDURES:  1. Proximal interphalangeal joint resection.  2. Flexor-to-extensor tendon transfer.  3. Weil osteotomy at the second metatarsal neck with internal fixation.   SURGEON:  Newt Minion, M.D.   ANESTHESIA:  General.   ESTIMATED BLOOD LOSS:  Minimal.   ANTIBIOTICS:  1 gram of Kefzol.   TOURNIQUET TIME:  Esmarch at the ankle for approximately 35 minutes.   DISPOSITION:  To PACU in stable condition.   INDICATIONS FOR PROCEDURE:  The patient is a 60 year old gentleman with type  2 diabetes who has had the chronic above-mentioned ulcerative problem with  the clawing of the toe.  The patient has failed conservative care including  pressure unloading shoe wear modification, antibiotics, and wound care  without relief and presents at this time for the above-mentioned procedures.  The risks and benefits were discussed including infection, neurovascular  injury, recurrence of the deformity, maldeformity of the second toe, need  for additional surgery.  The patient states he understands and wishes to  proceed at this time.   DESCRIPTION OF PROCEDURE:  The patient was brought to the outpatient OR and  underwent a general anesthetic.  After adequate level of anesthesia  obtained, the patient's left lower extremity was prepped using Duraprep and  draped in a sterile field.  A  dorsal incision was made over the PIP and MTP  joint of the left second toe.  Attention was first focused on the PIP joint.  Subperiosteal dissection was carried around the PIP joint and the PIP joint  was resected.  The flexor tendons were then tagged with 3-0 Ethibond and  then were freed to allow these tends to loop dorsally over the proximal  phalanx.  Attention was then focused at the MTP joint.  The MTP joint was  dissected subperiosteally.  The metatarsal head and neck that underwent a  Weil osteotomy cut with a very thin sliver of bone resected with a proximal  and dorsal displacement of the metatarsal head.  This was then held  stabilized with the medial right medical screw with a snap off head which  was 12 mm in length.  The wound was irrigated with normal saline.  The toe  was held extended and the long flexors were tied over the dorsum of  the  proximal phalanx.  The PIP joint was held stable with a 6-2 K wire.  The  wound was irrigated with normal saline.  The incision was closed using 2-0  nylon with a vertical mattress stitch.  The tourniquet was deflated after 35  minutes.  The wound was covered with Adaptic orthopedic sponges, sterile  Webril, and a loosely wrapped Coban dressing.  The patient  was extubated and taken to PACU in stable condition.  Plan for discharge to home, Darco postoperative shoe, crutches,  nonweightbearing, elevation, follow up in the foot clinic in two weeks to  remove the K wire.                                               Newt Minion, M.D.    MVD/MEDQ  D:  06/03/2002  T:  06/03/2002  Job:  [id number]

## 2010-10-05 NOTE — Op Note (Signed)
NAME:  [name], [name] NO.:  [id number]   MEDICAL RECORD NO.:  [id number]          PATIENT TYPE:  AMB   LOCATION:  ENDO                         FACILITY:  [facility]   PHYSICIAN:  Waverly Ferrari, M.D.    DATE OF BIRTH:  08-Sep-1950   DATE OF PROCEDURE:  05/07/2005  DATE OF DISCHARGE:                                 OPERATIVE REPORT   Please note copy.           ______________________________  Waverly Ferrari, M.D.     GMO/MEDQ  D:  05/07/2005  T:  05/08/2005  Job:  [id number]   cc:   Eilleen Kempf, MD  Fax: [phone]

## 2010-10-05 NOTE — Consult Note (Signed)
NAME:  [name], [name] NO.:  [id number]   MEDICAL RECORD NO.:  [id number]          PATIENT TYPE:  INP   LOCATION:  [id number]                         FACILITY:  [facility]   PHYSICIAN:  Eilleen Kempf, MD  DATE OF BIRTH:  August 07, 1950   DATE OF CONSULTATION:  05/04/2004  DATE OF DISCHARGE:                                   CONSULTATION   REFERRING PHYSICIAN:  Nicanor Alcon, M.D.   REASON FOR CONSULTATION:  A 60 year old African-American male diagnosed with  esophageal adenocarcinoma.   HISTORY:  [name] is a 60 year old African-American male with past  medical history significant for diabetes mellitus, morbid obesity,  hypertension, and GERD symptoms, who presented in September 2005 with  worsening GERD symptoms.  This led to upper endoscopy with a biopsy on  February 07, 2004, under the care of Dr. Lajoyce Corners, and it revealed high-grade  dysplasia.  Barium esophagogram on February 22, 2004, revealed a small,  irregular filling defect within the distal esophagus near the GE junction.  CT scan of the chest, abdomen, and pelvis performed on the same day showed  mild thickening of the distal esophagus with one small lymph node noted in  the gastrohepatic ligament area but no other areas suggestive of metastasis  were identified.  Repeat upper endoscopy with biopsy on March 27, 2004,  showed invasive adenocarcinoma.  The patient was referred to Dr. Arlyce Dice, who  ordered a PET scan and it was performed on April 01, 2004, and showed  minimal increased uptake and FDG activity in the region of the distal  esophagus with no other areas of abnormality elsewhere.  On April 30, 2004, the patient underwent left thoracoabdominal esophagogastrectomy with  jejunostomy and pyloroplasty under the care of Dr. Arlyce Dice, and the pathology  revealed 1.8 cm invasive well-differentiated adenocarcinoma.  The tumor  extended into the submucosa with 10 negative lymph nodes on dissection,  and  the pathology staging was (PT1, PN0, PMX).  On evaluation today, he  continued to complain of fatigue with baseline shortness of breath but no  other complaints.   REVIEW OF SYSTEMS:  No fever, chills, headache, blurring of vision, double  vision.  No chest pain.  Continued to have baseline shortness of breath.  No  cough, syncope, or palpitations.  No nausea or vomiting, abdominal pain,  diarrhea or constipation, melena or hematochezia.  No dysuria, hematuria,  urgency, or increased frequency.   PAST MEDICAL HISTORY:  1.  Diabetes.  2.  Hypertension.  3.  Obesity.  4.  GERD.   FAMILY HISTORY:  Significant for diabetes mellitus.   SOCIAL HISTORY:  He is married.  Works as a Company secretary.  Denied having any  current history of smoking or alcohol abuse.   ALLERGIES:  No known drug allergies.   HOME MEDICATIONS:  Glucophage, Glucotrol, Nexium, Norvasc, Avandia, insulin,  and aspirin.   PHYSICAL EXAMINATION:  VITAL SIGNS:  Blood pressure was 165/70, pulse 105,  respiratory rate 16, temperature 98.3, oxygen saturation 97% on 2 L.  GENERAL:  A morbidly obese African-American male, awake, alert, in no acute  distress.  HEENT:  Normocephalic, atraumatic.  Clear oropharynx.  NECK:  Supple, no JVD, no thyromegaly or lymphadenopathy.  CHEST:  Few bilateral crackles.  CARDIOVASCULAR:  Normal S1, S2, regular rhythm and rate.  No murmur, gallop,  or rub.  ABDOMEN:  Soft, nontender, nondistended, no masses.  EXTREMITIES:  No edema.   LABORATORY DATA:  White blood count 15.6, platelets 293, hemoglobin 8.7,  hematocrit 25.5.  Sodium 139, potassium 3.3, BUN 14, creatinine 1.1, glucose  227, calcium 8.0.   ASSESSMENT AND PLAN:  This is a 60 year old African-American male recently  diagnosed with a stage I (T1, N0, M0) distal esophageal adenocarcinoma.  At  this point there is no benefit for adjuvant chemotherapy or radiotherapy for  this early stage of the disease, and the patient will  continue on close  observation.  I will repeat CT scan of the chest and abdomen in three  months, also consider repeating EGD in three months.  For his anemia, the  patient will need transfusion of packed red blood cells if his hemoglobin  drops below 8.0.  I will also check iron studies for further evaluation.  I  will arrange a follow-up appointment for the patient with me at the regional  cancer center in two months for further evaluation and close monitoring.   Thank you so much for allowing me to participate in the care of [name].      Moha   MKM/MEDQ  D:  05/05/2004  T:  05/07/2004  Job:  [id number]   cc:   Nicanor Alcon, M.D.  [address]  [place]  [place] [address]

## 2010-10-05 NOTE — H&P (Signed)
NAME:  [name], [name] NO.:  [id number]   MEDICAL RECORD NO.:  [id number]          PATIENT TYPE:  OUT   LOCATION:  P[facility]                         FACILITY:  [facility]   PHYSICIAN:  Nicanor Alcon, M.D. DATE OF BIRTH:  02-Nov-1950   DATE OF ADMISSION:  04/24/2004  DATE OF DISCHARGE:                                HISTORY & PHYSICAL   CHIEF COMPLAINT:  Esophageal cancer.   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  Because of this and  the increasing burning, he underwent an upper GI endoscopy which revealed  high grade dysplasia.  A CT scan was done that showed an irregularity of the  distal esophagus.  A swallow also a question of a small lesion just above  the gastroesophageal junction.  He had a repeat endoscopy on the 8th which  showed adenocarcinoma and a background of high grade dysplasia.  He was  referred for surgical treatment of his esophageal cancer.  A PET scan was  done on April 18, 2004.  It showed a slight increase in activity of the  distal esophagus but no other abnormalities.   PAST MEDICAL HISTORY:  1.  Positive for diabetes at age 60.  2.  He has morbid obesity.  3.  Hypertension.   ALLERGIES:  None.   MEDICATIONS:  1.  Glucophage 1000 mg b.i.d.  2.  Glucotrol 10 mg b.i.d.  3.  Avandia 4 mg b.i.d.  4.  Nexium 40 mg q.d.  5.  Norvasc 10 mg q.d.  6.  He takes Antivert occasionally.  7.  He is on insulin 70/30 with 20 units b.i.d.  8.  Baby aspirin.   FAMILY HISTORY:  Positive for diabetes.  Negative for vascular disease and  cancer.   SOCIAL HISTORY:  He works as a Company secretary.  He is married.  Does not smoke.  Does not drink alcohol.   REVIEW OF SYMPTOMS:  Weight is 330 pounds and he is 6 foot 5.  He has had no  change in his weight recently.  CARDIAC:  He has had a recent Cardiolite  which was negative and he had a catheterization two years ago  which was also  negative.  No history of atrial fibrillation. PULMONARY:  He has no history  of bronchitis, hemoptysis, wheezing.  GASTROINTESTINAL:  As above.  He has  been treated for chronic reflux.  No history of abdominal pain, diarrhea, or  constipation.  No history of melena.  URINARY:  He had no history of kidney  disease, burning on urination, or frequency.  VASCULAR:  He has no history  of claudication.  No history of DVT, strokes.  No history of TIAs.  NEUROLOGICAL:  There is no history of headache, seizures, blackouts.  ORTHOPEDICS:  No history of joint pain, muscular pain, or rash.  PSYCHIATRIC:  He has no history of depression or nervousness.  HEENT:  There  is no history of change in his eye sight.  No change in his hearing.  He has  no  problems as far bleeding.  No clotting problems or anemia.  SKIN:  No  skin lesion.   PHYSICAL EXAMINATION:  VITAL SIGNS:  Blood pressure is 152/70, pulse 88,  respirations 18, temperature 98.6.  Saturation 98.6.  HEENT:  Head:  Atraumatic.  Eyes:  Pupils are equal, round, and reactive to  light and accommodation.  Extraocular movements are normal.  Ears:  Tympanic  membranes are intact.  Nares:  The septum is in the midline.  Mouth:  Without lesions.  NECK:  No JVD.  No thyromegaly.  No supraclavicular or axillary adenopathy.  CHEST:  Clear to auscultation and percussion.  HEART:  Regular sinus rhythm.  No murmurs.  No increased PMI.  ABDOMEN:  Obese.  There is no hepatosplenomegaly.  Bowel sounds are normal.  EXTREMITIES:  There are 2+ pulses.  There is no clubbing or edema.  NEUROLOGICAL:  Oriented x 3.  Sensory and motor are intact.  Cranial nerves  II through XII intact.   IMPRESSION:  1.  Esophageal cancer.  2.  Hypertension.  3.  Morbid obesity.  4.  Diabetes mellitus.   PLAN:  Transhiatal esophagectomy.   LABORATORY DATA:  His pulmonary function test showed an FVC of 3.90 are 69%  of predicted, and FEV1 of 2.85 or 74%  predicted.  His diffuse opacity is 66%  of predicted.  His lung volumes are 70% of predicted indicative of moderate  restrictive total lung capacity and mild obstructive airway disease.       DPB/MEDQ  D:  04/25/2004  T:  04/25/2004  Job:  [id number]

## 2010-10-05 NOTE — Op Note (Signed)
NAME:  [name], [name] NO.:  [id number]   MEDICAL RECORD NO.:  [id number]          PATIENT TYPE:  AMB   LOCATION:  ENDO                         FACILITY:  [facility]   PHYSICIAN:  Waverly Ferrari, M.D.    DATE OF BIRTH:  1950/11/19   DATE OF PROCEDURE:  05/07/2005  DATE OF DISCHARGE:                                 OPERATIVE REPORT   PROCEDURES:  Upper endoscopy.   INDICATIONS:  Status post esophagectomy for adenocarcinoma.  A follow-up  endoscopy to evaluate for Barrett's esophagus.   ANESTHESIA:  Demerol 50 mg , Versed 5 mg.   PROCEDURE:  With the patient mildly sedated in the left lateral decubitus  position, the Olympus videoscopic endoscope was inserted in the mount and  passed under direct vision through the esophagus -- which appeared normal  until we reached the distal esophagus.  The squamocolumnar junction was  somewhat irregular, but did not know whether this was just surgical change  or whether this was recurrent Barrett's; but, it was photographed and  multiple biopsies were taken.  I entered into the stomach; fundus, body,  antrum, duodenal bulb, second portion of duodenum were visualized.  From  this point the endoscope was slowly withdrawn, taking circumferential views  of duodenal mucosa, until the endoscope had been pulled back into the  stomach and placed in retroflexion to view the stomach from below.  Normal  __________  patent.  The gastroesophageal junction was seen and  photographed. The endoscope was then straightened and withdrawn. The  patient's vital signs, pulse oximetry remained stable. The patient tolerated  the procedure well without complication.   FINDINGS:  Changes of the distal esophagus at the neosquamocolumnar  junction.   PLAN:  Await biopsy report. The patient will call me for follow-up with me  as an outpatient.           ______________________________  Waverly Ferrari, M.D.     GMO/MEDQ  D:  05/07/2005  T:  05/08/2005   Job:  [id number]   cc:   Nicanor Alcon, M.D.  [address]  [place]  [place] [address]

## 2010-10-05 NOTE — Op Note (Signed)
NAME:  [name], [name] NO.:  [id number]   MEDICAL RECORD NO.:  [id number]          PATIENT TYPE:  AMB   LOCATION:  ENDO                         FACILITY:  [facility]   PHYSICIAN:  Waverly Ferrari, M.D.    DATE OF BIRTH:  Nov 23, 1950   DATE OF PROCEDURE:  05/29/2006  DATE OF DISCHARGE:                               OPERATIVE REPORT   PROCEDURE:  Upper endoscopy.   INDICATIONS:  Gastroesophageal reflux disease with known Barrett's  esophagus and esophageal cancer in the past.   ANESTHESIA:  Demerol 50 mg, Versed 5 mg.   PROCEDURE:  With the patient mildly sedated in the left lateral  decubitus position, the Pentax videoscopic endoscope was inserted in the  mouth, passed under direct vision through the esophagus which appeared  normal until we reached distal esophagus and there were changes of  Barrett's photographed and subsequently biopsied.  We entered into the  stomach.  Fundus, body, antrum, duodenal bulb, second portion duodenum  appeared normal.  From this point the endoscope was slowly withdrawn  taking circumferential views of duodenal mucosa until the endoscope had  been pulled back in the stomach placed in retroflexion to view the  stomach from below.  The endoscope was then straightened and withdrawn  taking circumferential views remaining gastric and esophageal mucosa.  The patient's vital signs, pulse oximeter remained stable.  The patient  tolerated procedure well without apparent complications.   FINDINGS:  Barrett's esophagus.  Await biopsy report.  The patient will  call me for results and follow-up with me as an outpatient.           ______________________________  Waverly Ferrari, M.D.     GMO/MEDQ  D:  05/29/2006  T:  05/29/2006  Job:  [id number]

## 2010-10-05 NOTE — Op Note (Signed)
NAME:  IBRAHAM, NEDD NO.:  [id number]   MEDICAL RECORD NO.:  [id number]          PATIENT TYPE:  AMB   LOCATION:  ENDO                         FACILITY:  [facility]   PHYSICIAN:  Waverly Ferrari, M.D.    DATE OF BIRTH:  05/30/1950   DATE OF PROCEDURE:  02/07/2004  DATE OF DISCHARGE:                                 OPERATIVE REPORT   PROCEDURE PERFORMED:  Colonoscopy.   ENDOSCOPIST:  Waverly Ferrari, M.D.   INDICATIONS FOR PROCEDURE:  __________.   ANESTHESIA:  Demerol __________.   DESCRIPTION OF PROCEDURE:  With the patient mildly sedated in the left  lateral decubitus position, the Olympus video colonoscope was inserted  __________to the cecum, identified by the crow's foot of the cecum, advanced  to the __________ all of which were photographed.  From this point the  colonoscope was slowly withdrawn taking circumferential views of the colonic  mucosa stopping only in the rectum which appeared normal on direct  __________.  The endoscope was straightened and withdrawn.  The patient's  vital signs and pulse oximeter remained stable.  The patient tolerated the  procedure well without apparent complications.   FINDINGS:  Unremarkable examination.   PLAN:  See endoscopy note for further details and follow-up.       GMO/MEDQ  D:  02/07/2004  T:  02/08/2004  Job:  [id number]

## 2010-10-05 NOTE — Op Note (Signed)
NAME:  [name], VOET NO.:  [id number]   MEDICAL RECORD NO.:  [id number]          PATIENT TYPE:  AMB   LOCATION:  ENDO                         FACILITY:  [facility]   PHYSICIAN:  Waverly Ferrari, M.D.    DATE OF BIRTH:  [birth year]   DATE OF PROCEDURE:  03/27/2004  DATE OF DISCHARGE:                                 OPERATIVE REPORT   PROCEDURE PERFORMED:  Upper endoscopy with biopsy.   ENDOSCOPIST:  Waverly Ferrari, M.D.   INDICATIONS FOR PROCEDURE:  Previously noted high grade dysplasia.   ANESTHESIA:  Demerol 70 mg, Versed 7 mg.   DESCRIPTION OF PROCEDURE:  With the patient mildly sedated in the left  lateral decubitus position, the Olympus videoscopic endoscope was inserted  in the mouth and passed under direct vision through the esophagus which  appeared normal until we reached the distal esophagus and there was an at  least 2 cm mass involving the distal esophagus with 1 to 2 areas that were  raised adjacent to it that were suspicious for disease as well.  We took  photographs and biopsies of this area.  We entered into the stomach.  The  fundus, body, antrum, duodenal bulb and second portion of the duodenum all  appeared normal.  From this point, the endoscope was slowly withdrawn taking  circumferential views of the entire duodenal mucosa until the endoscope was  pulled back into the stomach and placed on retroflexion to view the stomach  from below.  The endoscope was then straightened and withdrawn taking  circumferential views of the remaining gastric and esophageal mucosa.  The  patient's vital signs and pulse oximeter remained stable.  The patient  tolerated the procedure well without apparent complications.   FINDINGS:  Mass-like lesion of distal esophagus, previously noted to have  high grade dysplasia.   PLAN:  Await biopsy report, but without question, needs to proceed with  definitive therapy.       GMO/MEDQ  D:  03/27/2004  T:  03/27/2004  Job:   [id number]   cc:   Nicanor Alcon, M.D.  [address]  [place]  [place] [address]

## 2010-10-05 NOTE — Discharge Summary (Signed)
NAME:  PELE, STUMPO NO.:  [id number]   MEDICAL RECORD NO.:  [id number]          PATIENT TYPE:  INP   LOCATION:  [id number]                         FACILITY:  [facility]   PHYSICIAN:  Nicanor Alcon, M.D. DATE OF BIRTH:  [birth year]   DATE OF ADMISSION:  04/29/2004  DATE OF DISCHARGE:  05/14/2004                                 DISCHARGE SUMMARY   ADMISSION DIAGNOSIS:  Esophageal cancer.   DISCHARGE/SECONDARY DIAGNOSES:  1.  Esophageal cancer, status post esophagectomy.      1.  Pathology positive for adenocarcinoma well-differentiated T1, N0,          MX.  2.  Hypertension.  3.  Morbid obesity.  4.  Diabetes mellitus type 2.  5.  Gastroesophageal reflux disease.  6.  Short-term postoperative ventilatory dependent respiratory failure      secondary to esophagectomy.  7.  Postoperative mild renal insufficiency with creatinine on discharge of      1.4.  8.  Postoperative leukocytosis resolving at discharge.  Cultures negative.      Treated with empiric antibiotics.  9.  No known drug allergies.   PROCEDURE:  April 30, 2004.  Left video-assisted thorascopic surgery,  left thoracoabdominal esophagogastrectomy with jejunostomy and pyloroplasty  for adenocarcinoma of the distal esophagus.  Surgeon:  Dr. Pierre Bali.   CONSULTATIONS:  1.  Hematology oncologist, Dr. Eilleen Kempf.  2.  Critical care/pulmonologist, Dr. Vita Barley.  3.  Occupational therapy.  4.  Physical therapy.  5.  Dietician.  6.  Diabetes treatment team.  7.  Care management.   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  Because of his increasing heartburn, he underwent an upper GI endoscopy  which revealed high grade dysplasia.  A CT scan was done which showed an  irregularity of the distal esophagus.  A swallow study also showed a  questionable small lesion  just above the  gastroesophageal junction.  A  repeat endoscopy on November 8 which showed adenocarcinoma and a background  of high grade dysplasia.  He was referred to Dr. Keturah Barre. Marlyn Corporal of [facility]  for surgical treatment of his esophageal cancer.  A PET scan was done  April 18, 2004 which showed a slight increase in activity of the distal  esophagus, but no other abnormalities.  Preoperative pulmonary function test  showed an FVC of 3.90 or 69% of predicted and an FEV1 of 2.85 or 74%  predicted.  His diffusing capacity was 66% of predicted, and his lung  volumes were 70% of predicted indicative of moderate restrictive total lung  capacity and mild obstructive air space disease.  Due to the finding of  adenocarcinoma of the esophagus, Dr. Arlyce Dice recommended that he undergo a  transhiatal esophagectomy.  After discussing risks, benefits, and  alternatives with Mr. Missel he agreed to proceed in this.   HOSPITAL COURSE:  On April 29, 2004, Mr. Tureaud was elective admitted  to [facility].  On December 12, he underwent an esophagectomy  as  discussed above.  Initially, transhiatal esophagectomy was attempted, but  ultimately Dr. Arlyce Dice had to perform surgery via a left thoracoabdominal  approach.  Placement of a feeding jejunostomy tube was placed  intraoperatively.  Postoperatively, he was transferred to the surgical  intensive care unit.  He remained intubated and critical care/pulmonology  was consulted for vent management.  His diabetes was initially managed on  the Glucomander protocol.  He was started on IV Cefazoxime.  He was started  on albuterol nebulizers and IV Protonix.  He remained intubated until  May 01, 2004.  In the meantime he remained hemodynamically stable  although spiking fevers over 101.  His white count was also slightly  elevated at 13,000.  These were felt most likely inflammatory responses.  Cultures were done which were essentially negative.  By December  14, a tube  feed was with Peptimin.  He had been weaned from [place] and diabetes was  now being treated with  Lantus insulin.  His chest tube remained with  minimal output and no air leak.  His post extubation chest x-rays did show  mild edema secondary to third spacing and gentle diuresis was initiated.  He  did continue to have an increase in his white count and was continued on IV  antibiotics.  Aggressive pulmonary toilet was encouraged.  Overall, however,  he was thought to be making good progress and was felt ready for transfer  out of the unit onto the step down unit 3300 by May 04, 2004.  Once on  unit 3300, Mr. Boatner was felt to make but steady progress.  He was  evaluated by Dr. Julien Nordmann from medical oncology who felt that based on his  stage 1 distal esophageal adenocarcinoma that he would not benefit from  adjuvant chemotherapy or radiotherapy for early disease.  However, he did  recommend a repeat CT scan of the chest and abdomen in three months with  also consideration repeating his EGD in three months.  Dr. Julien Nordmann did  report that he would schedule Mr. Tukes to be seen at his office in two  months after discharge.  While on 3300, Mr. Pinsker was continued on IV  antibiotics.  He was also continued on diuretic therapy for volume overload.  Labs also showed mild renal insufficiency that remained stable throughout  his hospitalization.  Gastrografin swallow study was performed on December  17 which showed no evidence of extravasation or obstruction.  His chest  tubes remained for several days after to further evaluate for possible leak.  A swallow study was repeated on December 20 and again showed no evidence of  leak.  By this time, his chest tubes have been placed on water and it was  felt appropriate to discontinue __________tube.  Following removal of his  chest tube, a small left apical pneumothorax was noted which remained stable over the next several  days.  His remaining chest tube was ultimately  discontinued on December 24.  Post removal no pneumothorax was identified.  Once his swallow study on December 20 showed no leak, Dr. Arlyce Dice felt it was  appropriate to initiate p.m. meals.  His tube feeds were decreased as his  oral intake increased.  His Foley catheter was also removed and he was able  to void without difficulty.  Of note, during his first few days in the step  down unit, he was noted to exhibit mild confusion.  At that time, he was  being treated with Duragesic  patch.  With adjustments in his medication, his  confusion did subside.  While on 3300, he was also evaluated by occupational  and speech therapies.  Based on his progress prior to discharge, it was felt  that he would not require outpatient therapies.  However, an elevated toilet  seat and tub seat with back were recommended and the patient reported that  he would purchase these out-of-pocket.   By postoperative day #11, May 11, 2004, Mr. Asberry was tolerated a  soft diet.  His tube feeds were then discontinued.  He was increasing  mobilization.  His chest x-rays remained stable.  His white count, however,  remained elevated now peaking at 18,000.  At this point, he did have a  central line remaining.  This was discontinued and after completion of his  IV antibiotics was started on short course of oral Avelox.  Over the next  few days up to his discharge, his white count showed good downward trend  with only mild elevation of 13,000 at discharge.  During the same time  frame, his bowels were functioning appropriately.  His pain was controlled  on oral medication and his mentation had improved with adjustments of his  medication.  His incision remained stable with only a small area of the mid  point with some evidence of skin breakdown.  There was no erythema, however,  and serosanguineous drainage subsided after only a few days.  His blood  sugars were  improved on his home regimen of 70/30 insulin twice daily as  well as Glucotrol 10 mg twice daily.  It was felt that his Avandia could be  restarted as outpatient and Glucophage held at least temporarily until his  creatinine decreased.   On May 14, 2004, Mr. McDaniel's finally felt appropriate for discharge  home.  He was felt stable for discharge home.  He had his remaining chest  tube removed just two days before and showed no pneumothorax.  He did,  however, have persistent atelectasis and scarring in both lungs.  His white  count was trending down now to 13,000.  He did not continue to have  intermittent low grade temperatures between 99 and 100.  This was felt most  likely secondary to atelectasis and aggressive pulmonary toilet was  encouraged.  His vitals remained stable with blood pressure 145/65 and heart  rate in the 90s and in sinus rhythm.  He was saturating 93% on room air.  He was diuresing well and no longer showed signs of pulmonary edema.  His blood  sugars remained stable on the previously mentioned regimen.  His renal  function and blood counts also remained stable.  His J tube site remained  without signs of infection.  He and his wife had been successfully taught to  inject water flushes twice daily.  On exam his heart had a regular rate and  rhythm, his lungs were clear other than diminished bases.  His abdomen  remained obese, but benign.  His extremities showed no edema.  His incision  remained stable without erythema or drainage.  He was also able to ambulate  in the hallways without assistive device.  Although Mr. Yeck had had  intermittent days with feelings of discouragement, he was now in good  spirits and expressed desire to be discharged home.  Since his white count  had been trending down, and he was not spiking high fevers and showed no  evidence of obvious infection, it was felt appropriate that he  should be  discharged home on May 14, 2004.  His wife would be available to assist  him at discharge.   LABORATORY DATA:  On December 26 his white blood count was 13.2, hemoglobin  8.9, hematocrit 26.5, platelets 524.  Other most recent labs show a sodium  of 135, potassium 3.9, blood glucose 107, BUN 24, creatinine 1.4.  Urine  culture on December 21 was negative.  Prealbumin on December 19 was 12.3 and  amylase was normal at 78.  His total bilirubin was 0.6, alkaline phosphatase  107, SGOT 25, SGPT 55, total protein 5.4, albumin 2.2, TSH was 0.494 and  respiratory culture on December 13 showed no pathogenic or pharyngeal type  flora isolated.   DISCHARGE MEDICATIONS:  1.  Aspirin 81 mg p.o. daily.  2.  Norvasc 10 mg p.o. daily.  3.  Nexium 40 mg p.o. daily.  4.  Glucotrol 10 mg p.o. b.i.d.  5.  70/30 insulin 20 units subcutaneously b.i.d.  6.  Flush J tube with 30 mL of water twice daily.  7.  Avandia 4 mg p.o. b.i.d.  8.  Accuretic 12./20 mg one p.o. daily.  9.  Chlorazepate 3.75 mg p.o. b.i.d.  10. Tylox 1-2 tablets p.o. q.4h p.r.n. pain.  11. Instructed to hold Metformin until further instructed by Dr. Wilson Singer.   ACTIVITY:  He was to instructed to avoid driving or heavy lifting.  He was  encouraged to continue daily breathing and walking exercises.   DIET:  He is to follow diabetic appropriate diet with frequent small meals.   WOUND CARE:  He may shower daily.  Clean his incision gently with mild soap  and water.  He should also clean around his J tube site daily with soap and  water.   SPECIAL INSTRUCTIONS:  1.  He should continue to monitor his blood sugars twice daily and record.      His blood sugar record should be taken to his next followup appointment      with Dr. Wilson Singer.  2.  Should notify the [facility] office if he develops fever greater than 101 or      redness or drainage from his incision sites.   FOLLOW UP:  1.  He is to have a nurse visit at the [facility] office on May 21, 2004 for     suture removal  and wound check.  2.  He is to follow with Dr. Arlyce Dice at the [place] office on  January 10 at 4      p.m.  His staples will be removed at this appointment.  He is to have a      chest x-ray at the [facility] one hour before this      appointment.  3.  He is to follow up with Dr. Curt Bears on June 20, 2004 at 10:30      a.m.  4.  He is to call and schedule 1-2 week followup with Dr. Wilson Singer.       AWZ/MEDQ  D:  05/14/2004  T:  05/14/2004  Job:  [id number]   cc:   Eilleen Kempf, MD  Fax: [phone]   Ronaldo Miyamoto, M.D.  [address] [place]  [place] [address]  Fax: [phone]

## 2010-10-05 NOTE — Op Note (Signed)
NAME:  [name], [name] NO.:  [id number]   MEDICAL RECORD NO.:  [id number]          PATIENT TYPE:  AMB   LOCATION:  ENDO                         FACILITY:  [facility]   PHYSICIAN:  Waverly Ferrari, M.D.    DATE OF BIRTH:  December 20, 1950   DATE OF PROCEDURE:  09/25/2004  DATE OF DISCHARGE:                                 OPERATIVE REPORT   PROCEDURE:  Upper endoscopy   INDICATIONS:  History of esophageal cancer resected, with previous Barrett's  esophagus.   ANESTHESIA:  Demerol 60, Versed 6 mg.   PROCEDURE:  With the patient mildly sedated in the left lateral decubitus  position, the Olympus videoscopic endoscope was inserted and passed under  direct vision through the esophagus; which appeared normal until we reached  the distal esophagus, and there was just a small rim of red tissue  surrounding a suture that was seen and photographed. We then took multiple  biopsies in this area, which generally appeared fairly normal. We entered  into the stomach fundus, body, antrum, duodenal bulb, second portion of  duodenum were visualized.  From this point the endoscope was slowly  withdrawn, taking circumferential views of duodenal mucosa until the  endoscope had been pulled back into the stomach; placed in retroflexion to  view the stomach from below. We viewed the gastroesophageal anastomosis from  below in its entirety as well.  Endoscope was then straightened and  withdrawn, taking circumferential views of remaining gastric and esophageal  mucosa. The patient's vital signs, pulse oximetry remained stable. The  patient tolerated procedure well without apparent complications.   FINDINGS:  Mild erythema at the site of the suture.  It was seen; otherwise  an unremarkable examination.   PLAN:  Although the patient does have a  widely patent GE junction  presently, plan will be to await the biopsy report. The patient will call me  for results and follow-up with me as an  outpatient.      GMO/MEDQ  D:  09/25/2004  T:  09/25/2004  Job:  [id number]   cc:   Nicanor Alcon, M.D.  [address]  [place]  [place] [address]   Eilleen Kempf, MD  Fax: [phone]

## 2010-10-05 NOTE — Op Note (Signed)
NAME:  [name] NO.:  [id number]   MEDICAL RECORD NO.:  [id number]          PATIENT TYPE:  OUT   LOCATION:  PULM                         FACILITY:  [facility]   PHYSICIAN:  Nicanor Alcon, M.D. DATE OF BIRTH:  Oct 18, 1950   DATE OF PROCEDURE:  DATE OF DISCHARGE:  04/24/2004                                 OPERATIVE REPORT   PREOPERATIVE DIAGNOSES:  1.  Diabetes mellitus.  2.  Adenocarcinoma of the distal esophagus.   POSTOPERATIVE DIAGNOSES:  1.  Diabetes mellitus.  2.  Adenocarcinoma of the distal esophagus.   OPERATION PERFORMED:  Left video-assisted thorascopic surgery, left  thoracoabdominal esophagogastrectomy with jejunostomy and pyloroplasty.   SURGEON:  Nicanor Alcon, M.D.   FIRST ASSISTANTS:  1.  Suzzanne Cloud, P.A.-C.  [place], P.A.-C.   ANESTHESIA:  General.   After general anesthesia, the patient was prepped and draped in the usual  sterile manner.  This 60 year old diabetic was found to have dysphagia and  on endoscopy had a distal esophageal cancer.  It was hoped to be able to do  a __________ esophagectomy.  He was brought to the operating room.  An  incision was made from xiphoid toward the umbilicus.  Dissection was carried  down to the subcutaneous tissue.  The patient was 340 pounds and 6 foot 4,  and there was at least 4-6 cm of adipose tissue before we got into the  abdomen.  When I entered the abdomen, exploration was carried out, and a  small lesion could be felt in the distal esophagus, but his habitus was so  big, and there was so deep and so much adipose tissue, it was difficult to  do any type of dissection.  So, it was decided to convert him to a  thoracoabdominal incision in order to get adequate exposure.  The fascia was  closed with running #1 PDS and the skin with Ethicon skin clips, and then  the patient had a dual-lumen tube inserted.  He had already had a CVP and an  arterial line inserted.  He was  turned to 45 degrees up in a  thoracoabdominal incision.  An incision was made across the sixth  intercostal space down to across from the costal margin and connecting with  the abdominal incision.  The previous sutures were removed, and both  incisions were opened.  The latissimus was divided with electrocautery.  The  sixth intercostal space was entered, and then the costal margin was divided  with Bethune rib shears.  After this had been done, the diaphragm was  partially taken down off of the costal margin, and then that allowed [place] to  start our dissection.  A Kocher maneuver was performed, and the duodenum was  dissected out.  The dissection was started.  The lesser sac, also the left  lobe and the triangular shaped ligament of the liver was divided with  electrocautery, freeing up the left lobe which was overlying the hiatus.  The lesser sac was entered, and the omentum was divided between clamps with  2-0 silk ties, and this  was dissected up along the greater curvature up  toward the spleen.  When we got up toward the spleen, we started using the  Harmonic scalpel and divided the omentum with Harmonic scalpel.  There were  several areas that bled, and these were clipped with Ligaclips or oversewed  with 3-0 silk.  Then, the dissection was carried along the lesser curvature,  and dissection was carried up over to the hiatus.  The hiatus was freed up  with Harmonic scalpel.  The coronary vein was identified and divided with an  Autosuture 30 white reticular stapler.  Then, dissection was carried  posteriorly, taking the stomach off the pancreas and identifying the right  gastric, and this was stapled and divided with Autosuture stapler.  The  gastroepiploic was preserved.  Then, using the Harmonic scalpel, the short  gastrics were taken down from the spleen, and the spleen was protected.  The  left and right crux of the diaphragm were dissected up, and the right crux  was partially  divided to open up the hiatus.  The esophagus was then  dissected up approximately 5-6 cm, and you could palpate the cancer  approximately 2-3 cm above the gastroesophageal junction.  Then, a  fenestrated retractor was placed in the left chest, and the patient was  rotated more in the thoracotomy position.  The lung was deflated.  The  inferior pulmonary ligament was taken down with electrocautery, and then the  fascia was dissected up over the esophagus.  The esophagus was dissected  free and looped with a Penrose drain.  It was dissected up to the inferior  pulmonary vein.  It was then divided approximately 3-4 cm above the hiatus,  and attention was then turned to the abdomen, where the stomach was freed  up, and the stomach was divided from the greater curvature of the cardia  toward the lesser curvature with two applications of the TLC 75 stapler.  It  was sent for proximal and distal margins.  There was about a 2-3 cm  esophageal cancer, and the margins were adequate proximally and distally.  After this had been done, the staple line was imbricated with interrupted 3-  0 silk sutures.  Then, attention was turned to the pylorus.  Two sutures  were placed in each side of the pylorus.  The pylorus was opened with  electrocautery from the stomach down to the duodenum, and then it was closed  with a Heineke-Mikulicz pyloroplasty, closing it transversely with  interrupted 3-0 silk in a two-layer closure and then placing an omental  patch over the pyloroplasty.  A jejunostomy was then done, freeing the  jejunum up about 30 cm from the ligament of Treitz, taking a horizontal  mattress suture, opening the jejunum on the antimesenteric border, and  inserting a #14 red rubber Robinson catheter approximately 15 cm.  It was  then wetzeled.  Jejunostomy was performed, imbricating the red rubber  Robinson catheter for approximately 3 cm into the antimesenteric border of the jejunum.  It was then  brought out through the chest wall and sutured in  place with 0 silk.  The jejunum was then tacked to the abdominal wall with 3-  0 silk.  Stomach was then brought up into the left chest, and the end of the  esophagus staple line was cut off, and on the anterior portion of the  stomach a transverse opening was made, and then an ATB 35 stapler was placed  with one jaw  in the stomach and one jaw in the esophagus and fired, creating  the posterior anastomosis.  This anastomosis was reinforced with 3-0 silk  sutures.  Then, the anterior anastomosis was created with interrupted 3-0  Vicryl, closing it anteriorly and then reinforcing the second layer with 3-0  silk Lembert sutures.  It was tested for any air leak.  An NG tube had been  placed down into the stomach.  Then, Tisseel was placed across the  anastomosis to further reinforce it.  Two chest tubes were brought into the  left chest, a straight one posteriorly and a right angle right down to the  anastomosis.  They were brought out through separate stab wounds and sutured  in place with 0 silk.  The chest was then closed with #1 Vicryl pericostals.  #1 PDS was used to close the costal margin.  The fascia was run with #1 PDS  and interrupted #1 Vicryl.  The diaphragm was reapproximated to the costal  margin with #1 PDS.  Subcutaneous tissue was closed with 2-0 Vicryl, and the  skin was closed with 0 Prolene and Ethicon skin clips.  The patient was  returned to the intensive care unit in serious condition.       DPB/MEDQ  D:  04/30/2004  T:  04/30/2004  Job:  [id number]   cc:   Nicanor Alcon, M.D.  [address]  [place]  [place] [address]

## 2010-10-05 NOTE — Op Note (Signed)
NAME:  [name], [name] NO.:  [id number]   MEDICAL RECORD NO.:  [id number]          PATIENT TYPE:  AMB   LOCATION:  ENDO                         FACILITY:  [facility]   PHYSICIAN:  Waverly Ferrari, M.D.    DATE OF BIRTH:  03-22-1951   DATE OF PROCEDURE:  DATE OF DISCHARGE:                                 OPERATIVE REPORT   PROCEDURE:  Upper endoscopy with biopsy.   SURGEON:   INDICATIONS FOR PROCEDURE:  GERD.   ANESTHESIA:  Demerol 60 and Versed 6 mg.   DESCRIPTION OF PROCEDURE:  With the patient mildly sedated in the left  lateral decubitus position, the Olympus videoscopic panendoscope was  inserted in the mouth and passed under direct vision through the esophagus  which appeared normal ___________ distal esophagus.  There appeared to be a  mass in the distal esophagus that was photographed and biopsied.  We entered  the stomach.  Fundus, body, antrum, duodenal bulb, second portion of the  duodenum were visualized.  From this point, the endoscope was slowly  withdrawn taking circumferential views of the duodenal mucosa stopping in  the bulb which appeared somewhat inflamed.  This was photographed and  biopsied.  The endoscope was pulled back into the stomach, placed in  retroflexion to view the stomach from below.  The endoscope was straightened  and withdrawn taking circumferential views of the remaining gastric and  esophageal mucosa, stopping in the body of the stomach which also appeared  mildly inflamed ___________ .  The patient's vital signs and pulse oximetry  remained stable, however, he developed ___________ squamocolumnar junction,  biopsy a bit worrisome for ___________ cecum.  Inflammatory changes of  duodenal bulb, await biopsy report _____________.       Belva Crome  D:  02/07/2004  T:  02/08/2004  Job:  [id number]

## 2011-02-12 LAB — CULTURE, BLOOD (ROUTINE X 2): Culture: NO GROWTH

## 2011-02-12 LAB — DIFFERENTIAL
Basophils Absolute: 0
Eosinophils Relative: 0
Lymphocytes Relative: 9 — ABNORMAL LOW
Monocytes Absolute: 1

## 2011-02-12 LAB — URINALYSIS, ROUTINE W REFLEX MICROSCOPIC
Glucose, UA: NEGATIVE
Hgb urine dipstick: NEGATIVE
Ketones, ur: 15 — AB
Nitrite: NEGATIVE
Protein, ur: 100 — AB
Specific Gravity, Urine: 1.029
Urobilinogen, UA: 1
pH: 6

## 2011-02-12 LAB — CBC
HCT: 31.2 — ABNORMAL LOW
HCT: 32.3 — ABNORMAL LOW
Hemoglobin: 10.6 — ABNORMAL LOW
MCHC: 34
MCV: 85.9
Platelets: 215
Platelets: 229
RBC: 3.77 — ABNORMAL LOW
WBC: 10.3
WBC: 11.8 — ABNORMAL HIGH

## 2011-02-12 LAB — COMPREHENSIVE METABOLIC PANEL
AST: 25
AST: 29
Albumin: 3.7
Alkaline Phosphatase: 93
BUN: 7
CO2: 26
Calcium: 8.8
Chloride: 103
Chloride: 95 — ABNORMAL LOW
Creatinine, Ser: 0.97
Creatinine, Ser: 1.17
GFR calc Af Amer: >60
GFR calc Af Amer: >60
GFR calc non Af Amer: >60
Glucose, Bld: 247 — ABNORMAL HIGH
Potassium: 4.8
Total Bilirubin: 0.5
Total Bilirubin: 1.4 — ABNORMAL HIGH

## 2011-02-12 LAB — URINE MICROSCOPIC-ADD ON

## 2011-02-12 LAB — BASIC METABOLIC PANEL
BUN: 12
Calcium: 8.6
Calcium: 8.8
GFR calc Af Amer: >60
GFR calc non Af Amer: >60
GFR calc non Af Amer: >60
Potassium: 3.7
Potassium: 3.8
Sodium: 132 — ABNORMAL LOW
Sodium: 135

## 2011-02-12 LAB — SEDIMENTATION RATE
Sed Rate: 59 — ABNORMAL HIGH
Sed Rate: 60 — ABNORMAL HIGH

## 2011-02-12 LAB — VANCOMYCIN, TROUGH: Vancomycin Tr: 24.2 — ABNORMAL HIGH

## 2011-02-12 LAB — LIPID PANEL
Cholesterol: 83
HDL: 21 — ABNORMAL LOW

## 2011-02-12 LAB — CULTURE, ROUTINE-ABSCESS

## 2011-02-12 LAB — TSH: TSH: 1.101

## 2011-02-12 LAB — HEMOGLOBIN A1C: Mean Plasma Glucose: 158

## 2011-02-12 LAB — B-NATRIURETIC PEPTIDE (CONVERTED LAB): Pro B Natriuretic peptide (BNP): 42

## 2011-02-26 LAB — CULTURE, ROUTINE-ABSCESS

## 2011-07-05 ENCOUNTER — Other Ambulatory Visit: Payer: Self-pay | Admitting: Family Medicine

## 2011-07-05 DIAGNOSIS — K6289 Other specified diseases of anus and rectum: Secondary | ICD-10-CM

## 2011-07-05 NOTE — Progress Notes (Signed)
Orders put in while pt still inpt. Oncology aware of pt and will be calling pt to set up appt. He apparently has been seen in the past by the Onc at [facility] for esophogeal malignancy.   PET scan scheduled for 07/16/11 at 9:30am. Pt needs to be NPO after midnight and arrive by 9:15. Marland Kitchen

## 2011-07-10 ENCOUNTER — Telehealth: Payer: Self-pay | Admitting: Family Medicine

## 2011-07-10 ENCOUNTER — Other Ambulatory Visit: Payer: Self-pay | Admitting: *Deleted

## 2011-07-10 NOTE — Telephone Encounter (Signed)
The [facility] is calling because the patient is clueless as to why Dr. Marily Memos is referring him to the [facility].

## 2011-07-11 NOTE — Telephone Encounter (Signed)
Spoke to Pt by phone. Wrong Pt. Apparently the cancer center is calling this gentleman who has the same name and birthdate as [name] who was DC from our service last week. Reviewed chart and will call cancer center to inform of error.

## 2011-07-11 NOTE — Progress Notes (Signed)
Erroneous encounter

## 2011-07-12 ENCOUNTER — Encounter: Payer: Self-pay | Admitting: *Deleted

## 2011-07-12 ENCOUNTER — Telehealth: Payer: Self-pay | Admitting: Internal Medicine

## 2011-07-12 NOTE — Telephone Encounter (Signed)
Chart opened in error

## 2011-07-16 ENCOUNTER — Inpatient Hospital Stay (HOSPITAL_COMMUNITY): Admission: RE | Admit: 2011-07-16 | Payer: Self-pay | Source: Ambulatory Visit

## 2011-11-07 ENCOUNTER — Other Ambulatory Visit: Payer: Self-pay | Admitting: Gastroenterology

## 2012-09-08 ENCOUNTER — Encounter: Payer: Self-pay | Admitting: Cardiothoracic Surgery

## 2012-09-08 ENCOUNTER — Institutional Professional Consult (permissible substitution) (INDEPENDENT_AMBULATORY_CARE_PROVIDER_SITE_OTHER): Payer: BC Managed Care – PPO | Admitting: Cardiothoracic Surgery

## 2012-09-08 VITALS — BP 148/85 | HR 85 | Resp 16 | Ht 76.0 in | Wt 203.0 lb

## 2012-09-08 DIAGNOSIS — I10 Essential (primary) hypertension: Secondary | ICD-10-CM | POA: Insufficient documentation

## 2012-09-08 DIAGNOSIS — N183 Chronic kidney disease, stage 3 unspecified: Secondary | ICD-10-CM | POA: Insufficient documentation

## 2012-09-08 DIAGNOSIS — E113553 Type 2 diabetes mellitus with stable proliferative diabetic retinopathy, bilateral: Secondary | ICD-10-CM | POA: Insufficient documentation

## 2012-09-08 DIAGNOSIS — E119 Type 2 diabetes mellitus without complications: Secondary | ICD-10-CM | POA: Insufficient documentation

## 2012-09-08 DIAGNOSIS — Z8501 Personal history of malignant neoplasm of esophagus: Secondary | ICD-10-CM

## 2012-09-08 NOTE — Progress Notes (Signed)
[address]            [place] [address]          [id number]      Purnell A Cherry [facility] Medical Record [id number] Date of Birth: [birth year]  Referring: Anda Kraft, MD Primary Care: No primary provider on file.  Chief Complaint:    Chief Complaint  Patient presents with  . Esophageal Cancer    follow up to review situation...saw Dr. Julien Nordmann 5 years ago and cancer free.    History of Present Illness:    62 yo male who dx with T1 adenocarcinoma of the distal esophagus in 04/2004  distal esophageal reaction vis left thoracoabdominal incision. He has had increased wt loss over the past year being worked up by Dr Wilson Singer. He has had yearly endoscopy with out bx evidence of dysplasia or cancer,  Most recently 10/2011. Patient has been concerned about left costal margin on the left.  He denies any difficulty swallowing.    Current Activity/ Functional Status:  Patient is independent with mobility/ambulation, transfers, ADL's, IADL's.  Zubrod Score: At the time of surgery this patient's most appropriate activity status/level should be described as: [x]  Normal activity, no symptoms []  Symptoms, fully ambulatory []  Symptoms, in bed less than or equal to 50% of the time []  Symptoms, in bed greater than 50% of the time but less than 100% []  Bedridden []  Moribund   Past Medical History  Diagnosis Date  . Esophageal cancer   . GERD (gastroesophageal reflux disease)   . Barrett esophagus   . Gastroparesis   . Diabetes mellitus   . Hypertension   . Chronic kidney disease (CKD), stage III (moderate)   . History of MRSA infection     Past Surgical History  Procedure Laterality Date  . Left vats, left thoracoabdominal esophagogastrectomy with jejunostomy and pyloroplasty      04/24/2004 Dr Arlyce Dice    Family History  Problem Relation Age of Onset  . Cancer Mother     colon, breast  . Heart disease Father   . Diabetes      FX HX      History   Social History  . Marital Status: Married    Spouse Name: N/A    Number of Children: N/A  . Years of Education: N/A   Occupational History  . minister    Social History Main Topics  . Smoking status: Never Smoker   . Smokeless tobacco: Never Used  . Alcohol Use: No  . Drug Use: No  . Sexually Active: Not on file   Other Topics Concern  . Not on file   Social History Narrative  . No narrative on file    History  Smoking status  . Never Smoker   Smokeless tobacco  . Never Used    History  Alcohol Use No     Allergies  Allergen Reactions  . Duragen (Estradiol Valerate)   . Fentanyl Other (See Comments)    hallucinations  . Morphine And Related     No current outpatient prescriptions on file.   No current facility-administered medications for this visit.       Review of Systems:     Cardiac Review of Systems: Y or N  Chest Pain [  n  ]  Resting SOB [  n ] Exertional SOB  [ n ]  Orthopnea [ n ]   Pedal Edema [ n  ]    Palpitations [n  ] Syncope  [n  ]   Presyncope [  n ]  General Review of Systems: [Y] = yes [  ]=no Constitional: recent weight change Blue.Reese  ]; anorexia [  ]; fatigue [  ]; nausea [  ]; night sweats [  ]; fever [  ]; or chills [  ];                                                                                                                                          Dental: poor dentition[ n ]; Last Dentist visit:   Eye : blurred vision [  ]; diplopia [   ]; vision changes [  ];  Amaurosis fugax[  ]; Resp: cough [  ];  wheezing[  ];  hemoptysis[  ]; shortness of breath[  ]; paroxysmal nocturnal dyspnea[  ]; dyspnea on exertion[  ]; or orthopnea[  ];  GI:  gallstones[  ], vomiting[  ];  dysphagia[  ]; melena[ n ];  hematochezia [ n ]; heartburn[  ];   Hx of  Colonoscopy[ y ]; GU: kidney stones [  ]; hematuria[  ];   dysuria [  ];  nocturia[  ];  history of     obstruction [  ]; urinary frequency [  ]             Skin: rash,  swelling[  ];, hair loss[  ];  peripheral edema[  ];  or itching[  ]; Musculosketetal: myalgias[  ];  joint swelling[  ];  joint erythema[  ];  joint pain[  ];  back pain[  ];  Heme/Lymph: bruising[  ];  bleeding[  ];  anemia[  ];  Neuro: TIA[  ];  headaches[  ];  stroke[  ];  vertigo[  ];  seizures[  ];   paresthesias[  ];  difficulty walking[  ];  Psych:depression[  ]; anxiety[  ];  Endocrine: diabetes[  ];  thyroid dysfunction[  ];  Immunizations: Flu [  ]; Pneumococcal[  ];  Other:  Physical Exam: BP 148/85  Pulse 85  Resp 16  Ht 6\' 4"  (1.93 m)  Wt 203 lb (92.08 kg)  BMI 24.72 kg/m2  SpO2 98%  General appearance: alert, cooperative, appears stated age and no distress Neurologic: intact Heart: regular rate and rhythm, S1, S2 normal, no murmur, click, rub or gallop and normal apical impulse Lungs: clear to auscultation bilaterally and normal percussion bilaterally Abdomen: soft, non-tender; bowel sounds normal; no masses,  no organomegaly Extremities: extremities normal, atraumatic, no cyanosis or edema and Homans sign is negative, no sign of DVT Wound: some inceased thickness at incision were it crosses left costal margin, abdominal incision   Diagnostic Studies & Laboratory data:     Recent Radiology Findings:   No  results found.    Recent Lab Findings: Lab Results  Component Value Date   WBC 7.3 06/06/2009   HGB 12.4* 06/06/2009   HCT 37.1* 06/06/2009   PLT 193 06/06/2009   GLUCOSE 132* 06/06/2009   CHOL  Value: 83        ATP III CLASSIFICATION:  <200     mg/dL   Desirable  200-239  mg/dL   Borderline High  >=240    mg/dL   High 08/28/2007   TRIG 69 08/28/2007   HDL 21* 08/28/2007   LDLCALC  Value: 48        Total Cholesterol/HDL:CHD Risk Coronary Heart Disease Risk Table                     Men   Women  1/2 Average Risk   3.4   3.3 08/28/2007   ALT 16 06/06/2009   AST 15 06/06/2009   NA 139 06/06/2009   K 4.1 06/06/2009   CL 104 06/06/2009   CREATININE 1.35 06/06/2009   BUN  22 06/06/2009   CO2 30 06/06/2009   TSH 1.101 Test methodology is 3rd generation TSH 08/28/2007   HGBA1C  Value: 6.6 (NOTE)   The ADA recommends the following therapeutic goals for glycemic   control related to Hgb A1C measurement:   Goal of Therapy:   < 7.0% Hgb A1C   Action Suggested:  > 8.0% Hgb A1C   Ref:  Diabetes Care, 22, Suppl. 1, 1999* 08/28/2007      Assessment / Plan:      Patient now 9 years s/p resection of early stage esophageal cancer with out evidence of recurrence He was resurrected about the incision was not problem. Agree with follow up endoscopy Continue work up of weight loss per primary care    Grace Isaac MD  [facility] [phone] 09/08/2012 9:49 PM

## 2012-12-04 ENCOUNTER — Emergency Department (INDEPENDENT_AMBULATORY_CARE_PROVIDER_SITE_OTHER)
Admission: EM | Admit: 2012-12-04 | Discharge: 2012-12-04 | Disposition: A | Discharge status: Home / Self Care | Payer: Self-pay | Source: Home / Self Care | Attending: Emergency Medicine | Admitting: Emergency Medicine

## 2012-12-04 ENCOUNTER — Encounter (HOSPITAL_COMMUNITY): Payer: Self-pay | Admitting: Emergency Medicine

## 2012-12-04 DIAGNOSIS — S139XXA Sprain of joints and ligaments of unspecified parts of neck, initial encounter: Secondary | ICD-10-CM

## 2012-12-04 DIAGNOSIS — S161XXA Strain of muscle, fascia and tendon at neck level, initial encounter: Secondary | ICD-10-CM

## 2012-12-04 DIAGNOSIS — S233XXA Sprain of ligaments of thoracic spine, initial encounter: Secondary | ICD-10-CM

## 2012-12-04 DIAGNOSIS — S29012A Strain of muscle and tendon of back wall of thorax, initial encounter: Secondary | ICD-10-CM

## 2012-12-04 MED ORDER — METHOCARBAMOL 500 MG PO TABS: 500.0000 mg | ORAL_TABLET | Freq: Three times a day (TID) | ORAL | Status: DC

## 2012-12-04 MED ORDER — MELOXICAM 15 MG PO TABS: 15.0000 mg | ORAL_TABLET | Freq: Every day | ORAL | Status: DC

## 2012-12-04 MED ORDER — OXYCODONE-ACETAMINOPHEN 5-325 MG PO TABS: ORAL_TABLET | ORAL | Status: DC
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
## 2012-12-04 NOTE — ED Notes (Signed)
Waiting discharge papers 

## 2013-02-03 ENCOUNTER — Other Ambulatory Visit (HOSPITAL_COMMUNITY): Payer: Self-pay | Admitting: Podiatry

## 2013-02-03 DIAGNOSIS — M7989 Other specified soft tissue disorders: Secondary | ICD-10-CM

## 2013-02-04 ENCOUNTER — Ambulatory Visit (HOSPITAL_COMMUNITY)
Admission: RE | Admit: 2013-02-04 | Discharge: 2013-02-04 | Disposition: A | Discharge status: Home / Self Care | Payer: BC Managed Care – PPO | Source: Ambulatory Visit | Attending: Internal Medicine | Admitting: Internal Medicine

## 2013-02-04 DIAGNOSIS — M7989 Other specified soft tissue disorders: Secondary | ICD-10-CM | POA: Insufficient documentation

## 2013-02-04 NOTE — Progress Notes (Signed)
Right Lower Extremity Venous Duplex. Negative. [place]

## 2013-03-03 ENCOUNTER — Encounter: Payer: Self-pay | Admitting: Podiatry

## 2013-03-03 ENCOUNTER — Ambulatory Visit (INDEPENDENT_AMBULATORY_CARE_PROVIDER_SITE_OTHER): Payer: BC Managed Care – PPO | Admitting: Podiatry

## 2013-03-03 VITALS — BP 129/76 | HR 77 | Resp 12 | Ht 76.0 in | Wt 210.0 lb

## 2013-03-03 DIAGNOSIS — L97509 Non-pressure chronic ulcer of other part of unspecified foot with unspecified severity: Secondary | ICD-10-CM

## 2013-03-03 DIAGNOSIS — Q828 Other specified congenital malformations of skin: Secondary | ICD-10-CM

## 2013-03-03 DIAGNOSIS — M86179 Other acute osteomyelitis, unspecified ankle and foot: Secondary | ICD-10-CM

## 2013-03-03 NOTE — Progress Notes (Signed)
Subjective:     Patient ID: [name], male   DOB: [birth year], 62 y.o.   MRN: [id number]  Toe Pain    patient states the toe is doing much better and is no longer swelling or draining   Review of Systems  All other systems reviewed and are negative.       Objective:   Physical Exam  Nursing note and vitals reviewed. Cardiovascular: Intact distal pulses.   Musculoskeletal: Normal range of motion.  Neurological: He is alert.  Skin: Skin is warm.   patient's right second toe has distal keratotic lesion formation no indications of active drainage. No redness in the foot is noted mild edema of the second toe still noted     Assessment:     Improving second toe right with distal ulceration improving with indications still that there may be osteomyelitis occur    Plan:     Educating him on the bone loss noted on x-ray and that hopefully clinically this will do well. Debrided the lesion and advised if any drainage to occur to let me know immediately

## 2013-03-22 ENCOUNTER — Encounter: Payer: Self-pay | Admitting: Podiatry

## 2013-03-22 ENCOUNTER — Ambulatory Visit (INDEPENDENT_AMBULATORY_CARE_PROVIDER_SITE_OTHER): Payer: BC Managed Care – PPO | Admitting: Podiatry

## 2013-03-22 VITALS — BP 156/79 | HR 76 | Resp 12

## 2013-03-22 DIAGNOSIS — L84 Corns and callosities: Secondary | ICD-10-CM

## 2013-03-22 DIAGNOSIS — M2040 Other hammer toe(s) (acquired), unspecified foot: Secondary | ICD-10-CM

## 2013-03-23 NOTE — Progress Notes (Signed)
Subjective:     Patient ID: [name], male   DOB: 05-07-1951, 62 y.o.   MRN: [id number]  Toe Pain    patient states him having trouble with several toe is but the second right has been doing well. I have developed performed on the fifth left and the big toes of both feet   Review of Systems     Objective:   Physical Exam  Nursing note and vitals reviewed. Constitutional: He is oriented to person, place, and time.  Cardiovascular: Intact distal pulses.   Musculoskeletal: Normal range of motion.  Neurological: He is oriented to person, place, and time.   patient is found to have keratotic lesion x3 bilateral with at risk neuropathy    Assessment:     At risk condition with lesion formation which is pre-ulcerative in its nature    Plan:     Debridement of lesions both feet with no drainage noted

## 2013-04-30 DIAGNOSIS — M2040 Other hammer toe(s) (acquired), unspecified foot: Secondary | ICD-10-CM

## 2013-05-03 ENCOUNTER — Ambulatory Visit (INDEPENDENT_AMBULATORY_CARE_PROVIDER_SITE_OTHER): Payer: BC Managed Care – PPO | Admitting: Podiatry

## 2013-05-03 ENCOUNTER — Encounter: Payer: Self-pay | Admitting: Podiatry

## 2013-05-03 VITALS — BP 139/80 | HR 79 | Resp 16

## 2013-05-03 DIAGNOSIS — M79609 Pain in unspecified limb: Secondary | ICD-10-CM

## 2013-05-03 DIAGNOSIS — L84 Corns and callosities: Secondary | ICD-10-CM

## 2013-05-03 DIAGNOSIS — B351 Tinea unguium: Secondary | ICD-10-CM

## 2013-05-03 DIAGNOSIS — M779 Enthesopathy, unspecified: Secondary | ICD-10-CM

## 2013-05-03 MED ORDER — TRIAMCINOLONE ACETONIDE 10 MG/ML IJ SUSP
10.0000 mg | Freq: Once | INTRAMUSCULAR | Status: AC
  Administered 2013-05-03: 10 mg

## 2013-05-03 NOTE — Patient Instructions (Signed)
Diabetes and Foot Care Diabetes may cause you to have problems because of poor blood supply (circulation) to your feet and legs. This may cause the skin on your feet to become thinner, break easier, and heal more slowly. Your skin may become dry, and the skin may peel and crack. You may also have nerve damage in your legs and feet causing decreased feeling in them. You may not notice minor injuries to your feet that could lead to infections or more serious problems. Taking care of your feet is one of the most important things you can do for yourself.  HOME CARE INSTRUCTIONS  Wear shoes at all times, even in the house. Do not go barefoot. Bare feet are easily injured.  Check your feet daily for blisters, cuts, and redness. If you cannot see the bottom of your feet, use a mirror or ask someone for help.  Wash your feet with warm water (do not use hot water) and mild soap. Then pat your feet and the areas between your toes until they are completely dry. Do not soak your feet as this can dry your skin.  Apply a moisturizing lotion or petroleum jelly (that does not contain alcohol and is unscented) to the skin on your feet and to dry, brittle toenails. Do not apply lotion between your toes.  Trim your toenails straight across. Do not dig under them or around the cuticle. File the edges of your nails with an emery board or nail file.  Do not cut corns or calluses or try to remove them with medicine.  Wear clean socks or stockings every day. Make sure they are not too tight. Do not wear knee-high stockings since they may decrease blood flow to your legs.  Wear shoes that fit properly and have enough cushioning. To break in new shoes, wear them for just a few hours a day. This prevents you from injuring your feet. Always look in your shoes before you put them on to be sure there are no objects inside.  Do not cross your legs. This may decrease the blood flow to your feet.  If you find a minor scrape,  cut, or break in the skin on your feet, keep it and the skin around it clean and dry. These areas may be cleansed with mild soap and water. Do not cleanse the area with peroxide, alcohol, or iodine.  When you remove an adhesive bandage, be sure not to damage the skin around it.  If you have a wound, look at it several times a day to make sure it is healing.  Do not use heating pads or hot water bottles. They may burn your skin. If you have lost feeling in your feet or legs, you may not know it is happening until it is too late.  Make sure your health care provider performs a complete foot exam at least annually or more often if you have foot problems. Report any cuts, sores, or bruises to your health care provider immediately. SEEK MEDICAL CARE IF:   You have an injury that is not healing.  You have cuts or breaks in the skin.  You have an ingrown nail.  You notice redness on your legs or feet.  You feel burning or tingling in your legs or feet.  You have pain or cramps in your legs and feet.  Your legs or feet are numb.  Your feet always feel cold. SEEK IMMEDIATE MEDICAL CARE IF:   There is increasing redness,   swelling, or pain in or around a wound.  There is a red line that goes up your leg.  Pus is coming from a wound.  You develop a fever or as directed by your health care provider.  You notice a bad smell coming from an ulcer or wound. Document Released: 05/03/2000 Document Revised: 01/06/2013 Document Reviewed: 10/13/2012 Exit[employer] Patient Information 2014 [employer].  

## 2013-05-03 NOTE — Progress Notes (Signed)
Subjective:     Patient ID: [name], male   DOB: [birth year], 62 y.o.   MRN: [id number]  HPI patient is found to have a painful corn fifth toe left with fluid buildup lesions on both feet and significant thickness nailbeds 1-5 both feet with patient has long-term diabetes and does have neuropathic disease   Review of Systems     Objective:   Physical Exam Patient is found to have thick mycotic nail infection to 1-5 both feet fluid fifth digit left with pain and keratotic lesions fifth digit fourth digit and second digit right    Assessment:     Chronic capsulitis with fluid buildup pain and inability to wear shoe gear comfortably along with painful lesions in nail disease    Plan:     Reviewed all conditions and today injected the left interphalangeal joint of the fifth toe with 5 milligrams dexamethasone 3 mg Xylocaine debrided lesions on both feet and debridement nailbeds 1-5 both feet with no pain

## 2013-07-08 ENCOUNTER — Encounter: Payer: Self-pay | Admitting: Podiatry

## 2013-07-08 ENCOUNTER — Ambulatory Visit (INDEPENDENT_AMBULATORY_CARE_PROVIDER_SITE_OTHER): Payer: BC Managed Care – PPO | Admitting: Podiatry

## 2013-07-08 DIAGNOSIS — Q828 Other specified congenital malformations of skin: Secondary | ICD-10-CM

## 2013-07-08 DIAGNOSIS — E1149 Type 2 diabetes mellitus with other diabetic neurological complication: Secondary | ICD-10-CM

## 2013-07-08 DIAGNOSIS — M79609 Pain in unspecified limb: Secondary | ICD-10-CM

## 2013-07-08 NOTE — Progress Notes (Signed)
Subjective:     Patient ID: [name], male   DOB: Jun 08, 1950, 63 y.o.   MRN: [id number]  HPI patient is a long-term diabetic with keratotic lesion severe nature fourth digit left hallux left and fifth digit both feet that he cannot take care of himself   Review of Systems     Objective:   Physical Exam Neurovascular status unchanged with severe keratotic lesion both feet that he cannot handle himself due to long-term diabetes    Assessment:     Long-term diabetes with lesions which have become ulcerated in the past    Plan:     Debridement of lesions no iatrogenic bleeding noted reappoint as needed

## 2013-08-02 ENCOUNTER — Ambulatory Visit (INDEPENDENT_AMBULATORY_CARE_PROVIDER_SITE_OTHER): Payer: BC Managed Care – PPO | Admitting: Podiatry

## 2013-08-02 ENCOUNTER — Encounter: Payer: Self-pay | Admitting: Podiatry

## 2013-08-02 VITALS — BP 133/80 | HR 72 | Resp 12

## 2013-08-02 DIAGNOSIS — Q828 Other specified congenital malformations of skin: Secondary | ICD-10-CM

## 2013-08-02 DIAGNOSIS — E1159 Type 2 diabetes mellitus with other circulatory complications: Secondary | ICD-10-CM

## 2013-08-04 NOTE — Progress Notes (Signed)
Subjective:     Patient ID: BRODRICK FIELDHOUSE, male   DOB: [birth year], 63 y.o.   MRN: [id number]  HPI patient has severe digital structural issues with distal keratotic lesions and history of diabetes and neuropathy   Review of Systems     Objective:   Physical Exam Neurovascular status unchanged with distal keratotic lesions digits to both feet and hallux of both feet that are not draining at the current time    Assessment:     Chronic lesion formation secondary to foot structure and long-term neuropathy diabetes    Plan:     Debridement of lesions on both feet with no iatrogenic bleeding noted and reappoint in 5 weeks earlier if issues should occur

## 2013-09-20 ENCOUNTER — Encounter: Payer: Self-pay | Admitting: Podiatry

## 2013-09-20 ENCOUNTER — Ambulatory Visit (INDEPENDENT_AMBULATORY_CARE_PROVIDER_SITE_OTHER): Payer: BC Managed Care – PPO | Admitting: Podiatry

## 2013-09-20 VITALS — BP 133/80 | HR 72 | Resp 16

## 2013-09-20 DIAGNOSIS — B351 Tinea unguium: Secondary | ICD-10-CM

## 2013-09-20 DIAGNOSIS — Q828 Other specified congenital malformations of skin: Secondary | ICD-10-CM

## 2013-09-20 DIAGNOSIS — E1149 Type 2 diabetes mellitus with other diabetic neurological complication: Secondary | ICD-10-CM

## 2013-09-20 DIAGNOSIS — M79609 Pain in unspecified limb: Secondary | ICD-10-CM

## 2013-09-20 NOTE — Patient Instructions (Signed)
Diabetes and Foot Care Diabetes may cause you to have problems because of poor blood supply (circulation) to your feet and legs. This may cause the skin on your feet to become thinner, break easier, and heal more slowly. Your skin may become dry, and the skin may peel and crack. You may also have nerve damage in your legs and feet causing decreased feeling in them. You may not notice minor injuries to your feet that could lead to infections or more serious problems. Taking care of your feet is one of the most important things you can do for yourself.  HOME CARE INSTRUCTIONS  Wear shoes at all times, even in the house. Do not go barefoot. Bare feet are easily injured.  Check your feet daily for blisters, cuts, and redness. If you cannot see the bottom of your feet, use a mirror or ask someone for help.  Wash your feet with warm water (do not use hot water) and mild soap. Then pat your feet and the areas between your toes until they are completely dry. Do not soak your feet as this can dry your skin.  Apply a moisturizing lotion or petroleum jelly (that does not contain alcohol and is unscented) to the skin on your feet and to dry, brittle toenails. Do not apply lotion between your toes.  Trim your toenails straight across. Do not dig under them or around the cuticle. File the edges of your nails with an emery board or nail file.  Do not cut corns or calluses or try to remove them with medicine.  Wear clean socks or stockings every day. Make sure they are not too tight. Do not wear knee-high stockings since they may decrease blood flow to your legs.  Wear shoes that fit properly and have enough cushioning. To break in new shoes, wear them for just a few hours a day. This prevents you from injuring your feet. Always look in your shoes before you put them on to be sure there are no objects inside.  Do not cross your legs. This may decrease the blood flow to your feet.  If you find a minor scrape,  cut, or break in the skin on your feet, keep it and the skin around it clean and dry. These areas may be cleansed with mild soap and water. Do not cleanse the area with peroxide, alcohol, or iodine.  When you remove an adhesive bandage, be sure not to damage the skin around it.  If you have a wound, look at it several times a day to make sure it is healing.  Do not use heating pads or hot water bottles. They may burn your skin. If you have lost feeling in your feet or legs, you may not know it is happening until it is too late.  Make sure your health care provider performs a complete foot exam at least annually or more often if you have foot problems. Report any cuts, sores, or bruises to your health care provider immediately. SEEK MEDICAL CARE IF:   You have an injury that is not healing.  You have cuts or breaks in the skin.  You have an ingrown nail.  You notice redness on your legs or feet.  You feel burning or tingling in your legs or feet.  You have pain or cramps in your legs and feet.  Your legs or feet are numb.  Your feet always feel cold. SEEK IMMEDIATE MEDICAL CARE IF:   There is increasing redness,   swelling, or pain in or around a wound.  There is a red line that goes up your leg.  Pus is coming from a wound.  You develop a fever or as directed by your health care provider.  You notice a bad smell coming from an ulcer or wound. Document Released: 05/03/2000 Document Revised: 01/06/2013 Document Reviewed: 10/13/2012 Exit[employer] Patient Information 2014 [employer].  

## 2013-09-21 NOTE — Progress Notes (Signed)
Subjective:     Patient ID: [name], male   DOB: [birth year], 63 y.o.   MRN: [id number]  HPI patient is a long-term diabetic with profound neuropathy and chronic digital deformities and also thick toenail deformity of both feet that he cannot take care of himself   Review of Systems     Objective:   Physical Exam Neurovascular status significantly diminished with nail disease in thickness 1 through 5 both feet and multiple keratotic lesions distal ends of toes 2 through 4 of both feet    Assessment:      at risk diabetic with mycotic nail infection's and lesions of both feet secondary to profound neuropathy and hammertoe deformity    Plan:     H&P performed and debridement of nails and lesions accomplished today. Reappoint 6 weeks earlier if any issues should occur

## 2013-11-01 ENCOUNTER — Ambulatory Visit (INDEPENDENT_AMBULATORY_CARE_PROVIDER_SITE_OTHER): Payer: BC Managed Care – PPO | Admitting: Podiatry

## 2013-11-01 ENCOUNTER — Encounter: Payer: Self-pay | Admitting: Podiatry

## 2013-11-01 VITALS — BP 134/78 | HR 73 | Resp 18

## 2013-11-01 DIAGNOSIS — L84 Corns and callosities: Secondary | ICD-10-CM

## 2013-11-01 DIAGNOSIS — E1149 Type 2 diabetes mellitus with other diabetic neurological complication: Secondary | ICD-10-CM

## 2013-11-01 DIAGNOSIS — M2040 Other hammer toe(s) (acquired), unspecified foot: Secondary | ICD-10-CM

## 2013-11-01 NOTE — Progress Notes (Signed)
° °  Subjective:    Patient ID: OTTAVIO BITZ, male    DOB: 02/28/1951, 63 y.o.   MRN: [id number]  HPI I NEED MY TOENAILS TRIMMED UP AND I HAVE SOME CALLUSES THAT NEED TO BE TAKEN CARE OF ON MY 2ND TOE AND 4TH TOE ON LEFT AND 4TH AND 5TH ON RIGHT    Review of Systems     Objective:   Physical Exam        Assessment & Plan:

## 2013-11-01 NOTE — Progress Notes (Signed)
Subjective:     Patient ID: [name], male   DOB: Nov 27, 1950, 63 y.o.   MRN: [id number]  HPI patient states he is very thick calluses on both feet which has become symptomatic over the last week   Review of Systems     Objective:   Physical Exam Neurovascular status unchanged with significant neuropathy noted and severe keratotic lesion third digit left on the right and left hallux     Assessment:     Significant callus formation with potential for ulceration as has occurred in the past    Plan:     Debridement of lesions with no iatrogenic bleeding noted and reappoint

## 2013-12-13 ENCOUNTER — Ambulatory Visit (INDEPENDENT_AMBULATORY_CARE_PROVIDER_SITE_OTHER): Payer: BC Managed Care – PPO | Admitting: Podiatry

## 2013-12-13 ENCOUNTER — Encounter: Payer: Self-pay | Admitting: Podiatry

## 2013-12-13 VITALS — BP 129/76 | HR 67 | Resp 18

## 2013-12-13 DIAGNOSIS — Q828 Other specified congenital malformations of skin: Secondary | ICD-10-CM

## 2013-12-13 DIAGNOSIS — L84 Corns and callosities: Secondary | ICD-10-CM

## 2013-12-13 DIAGNOSIS — E1149 Type 2 diabetes mellitus with other diabetic neurological complication: Secondary | ICD-10-CM

## 2013-12-13 NOTE — Progress Notes (Signed)
Subjective:     Patient ID: [name], male   DOB: 10-17-1950, 63 y.o.   MRN: [id number]  HPI patient presents with severely painful lesion distal fourth left that is not broken down but it makes hard for him to walk   Review of Systems     Objective:   Physical Exam Neurovascular status intact with distal keratotic lesion fourth left it's painful when pressed upon ambulation and moderate on the fifth toe left    Assessment:     Chronic lesion formation secondary to foot structure    Plan:     Debridement painful lesion fourth and fifth toes left with no iatrogenic bleeding noted

## 2014-01-31 ENCOUNTER — Ambulatory Visit: Payer: BC Managed Care – PPO | Admitting: Podiatry

## 2014-02-03 ENCOUNTER — Ambulatory Visit (INDEPENDENT_AMBULATORY_CARE_PROVIDER_SITE_OTHER): Payer: BC Managed Care – PPO | Admitting: Podiatry

## 2014-02-03 DIAGNOSIS — E1149 Type 2 diabetes mellitus with other diabetic neurological complication: Secondary | ICD-10-CM

## 2014-02-03 DIAGNOSIS — M79609 Pain in unspecified limb: Secondary | ICD-10-CM

## 2014-02-03 DIAGNOSIS — M79673 Pain in unspecified foot: Secondary | ICD-10-CM

## 2014-02-03 DIAGNOSIS — Q828 Other specified congenital malformations of skin: Secondary | ICD-10-CM

## 2014-02-03 DIAGNOSIS — B351 Tinea unguium: Secondary | ICD-10-CM

## 2014-02-03 NOTE — Progress Notes (Signed)
Subjective:     Patient ID: [name], male   DOB: 1950-07-14, 63 y.o.   MRN: [id number]  HPI long-term diabetic with the long gaited thick nailbeds 1-5 both feet and keratotic lesion on the left fourth digit distal and the right hallux   Review of Systems     Objective:   Physical Exam Neurovascular status found to be intact muscle strength was adequate and I noted that there is severe keratotic lesion fourth left first right and severe the long gaited nailbeds 1-5 both feet that are painful    Assessment:     At risk diabetic with mycotic nail infection and chronic lesions with history of ulceration    Plan:     Debridement nailbeds 1-5 on both feet and lesions on both the left and right foot with no iatrogenic bleeding noted. Reappoint to recheck 6 weeks earlier if any issues should occur

## 2014-03-31 ENCOUNTER — Ambulatory Visit: Payer: BC Managed Care – PPO | Admitting: Podiatry

## 2014-04-04 ENCOUNTER — Encounter: Payer: Self-pay | Admitting: Podiatry

## 2014-04-04 ENCOUNTER — Ambulatory Visit (INDEPENDENT_AMBULATORY_CARE_PROVIDER_SITE_OTHER): Payer: BC Managed Care – PPO | Admitting: Podiatry

## 2014-04-04 DIAGNOSIS — E1151 Type 2 diabetes mellitus with diabetic peripheral angiopathy without gangrene: Secondary | ICD-10-CM

## 2014-04-04 DIAGNOSIS — Q828 Other specified congenital malformations of skin: Secondary | ICD-10-CM

## 2014-04-04 DIAGNOSIS — M79673 Pain in unspecified foot: Secondary | ICD-10-CM

## 2014-04-04 DIAGNOSIS — B351 Tinea unguium: Secondary | ICD-10-CM

## 2014-04-04 NOTE — Patient Instructions (Signed)
Diabetes and Foot Care Diabetes may cause you to have problems because of poor blood supply (circulation) to your feet and legs. This may cause the skin on your feet to become thinner, break easier, and heal more slowly. Your skin may become dry, and the skin may peel and crack. You may also have nerve damage in your legs and feet causing decreased feeling in them. You may not notice minor injuries to your feet that could lead to infections or more serious problems. Taking care of your feet is one of the most important things you can do for yourself.  HOME CARE INSTRUCTIONS  Wear shoes at all times, even in the house. Do not go barefoot. Bare feet are easily injured.  Check your feet daily for blisters, cuts, and redness. If you cannot see the bottom of your feet, use a mirror or ask someone for help.  Wash your feet with warm water (do not use hot water) and mild soap. Then pat your feet and the areas between your toes until they are completely dry. Do not soak your feet as this can dry your skin.  Apply a moisturizing lotion or petroleum jelly (that does not contain alcohol and is unscented) to the skin on your feet and to dry, brittle toenails. Do not apply lotion between your toes.  Trim your toenails straight across. Do not dig under them or around the cuticle. File the edges of your nails with an emery board or nail file.  Do not cut corns or calluses or try to remove them with medicine.  Wear clean socks or stockings every day. Make sure they are not too tight. Do not wear knee-high stockings since they may decrease blood flow to your legs.  Wear shoes that fit properly and have enough cushioning. To break in new shoes, wear them for just a few hours a day. This prevents you from injuring your feet. Always look in your shoes before you put them on to be sure there are no objects inside.  Do not cross your legs. This may decrease the blood flow to your feet.  If you find a minor scrape,  cut, or break in the skin on your feet, keep it and the skin around it clean and dry. These areas may be cleansed with mild soap and water. Do not cleanse the area with peroxide, alcohol, or iodine.  When you remove an adhesive bandage, be sure not to damage the skin around it.  If you have a wound, look at it several times a day to make sure it is healing.  Do not use heating pads or hot water bottles. They may burn your skin. If you have lost feeling in your feet or legs, you may not know it is happening until it is too late.  Make sure your health care provider performs a complete foot exam at least annually or more often if you have foot problems. Report any cuts, sores, or bruises to your health care provider immediately. SEEK MEDICAL CARE IF:   You have an injury that is not healing.  You have cuts or breaks in the skin.  You have an ingrown nail.  You notice redness on your legs or feet.  You feel burning or tingling in your legs or feet.  You have pain or cramps in your legs and feet.  Your legs or feet are numb.  Your feet always feel cold. SEEK IMMEDIATE MEDICAL CARE IF:   There is increasing redness,   swelling, or pain in or around a wound.  There is a red line that goes up your leg.  Pus is coming from a wound.  You develop a fever or as directed by your health care provider.  You notice a bad smell coming from an ulcer or wound. Document Released: 05/03/2000 Document Revised: 01/06/2013 Document Reviewed: 10/13/2012 Exit[employer] Patient Information 2015 [employer]. This information is not intended to replace advice given to you by your health care provider. Make sure you discuss any questions you have with your health care provider.  

## 2014-04-05 NOTE — Progress Notes (Signed)
Subjective:     Patient ID: [name], male   DOB: 1950/09/20, 63 y.o.   MRN: [id number]  HPIpatient presents stating he's going to [place] for 3 weeks and he need to get his feet taken care of prior to going do to long-term diabetes and history of ulcerations   Review of Systems     Objective:   Physical Exam Neurovascular status unchanged with thick yellow brittle nailbeds 1-5 both feet that are painful and lesions on the right hallux third toe second toe with keratotic tissue formation and history of possible osteomyelitis    Assessment:     At risk diabetic with nail disease and lesions which have been present for a long time    Plan:     H&P performed and debridement of lesions and nailbeds accomplished with no iatrogenic bleeding noted. Reappoint to recheck in the next 6-8 weeks

## 2014-05-04 ENCOUNTER — Other Ambulatory Visit: Payer: Self-pay | Admitting: Gastroenterology

## 2014-05-16 ENCOUNTER — Ambulatory Visit (INDEPENDENT_AMBULATORY_CARE_PROVIDER_SITE_OTHER): Payer: BC Managed Care – PPO | Admitting: Podiatry

## 2014-05-16 ENCOUNTER — Encounter: Payer: Self-pay | Admitting: Podiatry

## 2014-05-16 DIAGNOSIS — E1151 Type 2 diabetes mellitus with diabetic peripheral angiopathy without gangrene: Secondary | ICD-10-CM

## 2014-05-16 DIAGNOSIS — Q828 Other specified congenital malformations of skin: Secondary | ICD-10-CM

## 2014-05-16 DIAGNOSIS — B351 Tinea unguium: Secondary | ICD-10-CM

## 2014-05-16 DIAGNOSIS — M79673 Pain in unspecified foot: Secondary | ICD-10-CM

## 2014-05-16 NOTE — Progress Notes (Signed)
Subjective:     Patient ID: [name], male   DOB: 1951-01-29, 63 y.o.   MRN: [id number]  HPI patient presents with a long time diabetes with lesions on the edges of the second and third toes of both feet and nail disease with thickness and pain 1-5 both feet that he cannot take care of with circulatory and neurological disease secondary to his diabetic history   Review of Systems     Objective:   Physical Exam Neurovascular status unchanged from previous visit with distal painful keratotic lesion second and third toes of both feet and nail disease with thickness 1-5 both feet that are painful when pressed    Assessment:     At risk diabetic with lesion formation and nail disease    Plan:     Debride painful lesions and painful nailbeds 1-5 both feet with no iatrogenic bleeding noted

## 2014-05-16 NOTE — Patient Instructions (Signed)
Diabetes and Foot Care Diabetes may cause you to have problems because of poor blood supply (circulation) to your feet and legs. This may cause the skin on your feet to become thinner, break easier, and heal more slowly. Your skin may become dry, and the skin may peel and crack. You may also have nerve damage in your legs and feet causing decreased feeling in them. You may not notice minor injuries to your feet that could lead to infections or more serious problems. Taking care of your feet is one of the most important things you can do for yourself.  HOME CARE INSTRUCTIONS  Wear shoes at all times, even in the house. Do not go barefoot. Bare feet are easily injured.  Check your feet daily for blisters, cuts, and redness. If you cannot see the bottom of your feet, use a mirror or ask someone for help.  Wash your feet with warm water (do not use hot water) and mild soap. Then pat your feet and the areas between your toes until they are completely dry. Do not soak your feet as this can dry your skin.  Apply a moisturizing lotion or petroleum jelly (that does not contain alcohol and is unscented) to the skin on your feet and to dry, brittle toenails. Do not apply lotion between your toes.  Trim your toenails straight across. Do not dig under them or around the cuticle. File the edges of your nails with an emery board or nail file.  Do not cut corns or calluses or try to remove them with medicine.  Wear clean socks or stockings every day. Make sure they are not too tight. Do not wear knee-high stockings since they may decrease blood flow to your legs.  Wear shoes that fit properly and have enough cushioning. To break in new shoes, wear them for just a few hours a day. This prevents you from injuring your feet. Always look in your shoes before you put them on to be sure there are no objects inside.  Do not cross your legs. This may decrease the blood flow to your feet.  If you find a minor scrape,  cut, or break in the skin on your feet, keep it and the skin around it clean and dry. These areas may be cleansed with mild soap and water. Do not cleanse the area with peroxide, alcohol, or iodine.  When you remove an adhesive bandage, be sure not to damage the skin around it.  If you have a wound, look at it several times a day to make sure it is healing.  Do not use heating pads or hot water bottles. They may burn your skin. If you have lost feeling in your feet or legs, you may not know it is happening until it is too late.  Make sure your health care provider performs a complete foot exam at least annually or more often if you have foot problems. Report any cuts, sores, or bruises to your health care provider immediately. SEEK MEDICAL CARE IF:   You have an injury that is not healing.  You have cuts or breaks in the skin.  You have an ingrown nail.  You notice redness on your legs or feet.  You feel burning or tingling in your legs or feet.  You have pain or cramps in your legs and feet.  Your legs or feet are numb.  Your feet always feel cold. SEEK IMMEDIATE MEDICAL CARE IF:   There is increasing redness,   swelling, or pain in or around a wound.  There is a red line that goes up your leg.  Pus is coming from a wound.  You develop a fever or as directed by your health care provider.  You notice a bad smell coming from an ulcer or wound. Document Released: 05/03/2000 Document Revised: 01/06/2013 Document Reviewed: 10/13/2012 Exit[employer] Patient Information 2015 [employer]. This information is not intended to replace advice given to you by your health care provider. Make sure you discuss any questions you have with your health care provider.  

## 2014-06-28 ENCOUNTER — Ambulatory Visit: Payer: BC Managed Care – PPO | Admitting: Podiatry

## 2014-06-30 ENCOUNTER — Ambulatory Visit (INDEPENDENT_AMBULATORY_CARE_PROVIDER_SITE_OTHER): Payer: Self-pay | Admitting: Podiatrist

## 2014-06-30 ENCOUNTER — Encounter: Payer: Self-pay | Admitting: Podiatrist

## 2014-06-30 DIAGNOSIS — Q667 Congenital pes cavus: Secondary | ICD-10-CM

## 2014-06-30 DIAGNOSIS — M79676 Pain in unspecified toe(s): Secondary | ICD-10-CM

## 2014-06-30 DIAGNOSIS — E1140 Type 2 diabetes mellitus with diabetic neuropathy, unspecified: Secondary | ICD-10-CM

## 2014-06-30 DIAGNOSIS — Q828 Other specified congenital malformations of skin: Secondary | ICD-10-CM

## 2014-06-30 DIAGNOSIS — B351 Tinea unguium: Secondary | ICD-10-CM

## 2014-06-30 DIAGNOSIS — M216X9 Other acquired deformities of unspecified foot: Secondary | ICD-10-CM

## 2014-06-30 DIAGNOSIS — L84 Corns and callosities: Secondary | ICD-10-CM

## 2014-06-30 NOTE — Progress Notes (Signed)
   HPI: Patient presents today for follow up of diabetic foot and nail care. Past medical history, meds, and allergies reviewed. Patient states blood sugar is under good  control.   Objective:   Objective:  Patients chart is reviewed.  Vascular status reveals pedal pulses noted at  1/4 dp and pt bilateral .  Neurological sensation is Decreased to Semmes Weinstein monofilament bilateral at 3/5 sites bilateral.  Dermatological exam reveals  presence of pre ulcerative/ hyperkeratotic lesions at the tips of bilateral halluces and the lateral sides of the fifth toes bilateral.  Contracture of all digits noted.   Toenails are elongated, incurvated, discolored, dystrophic with ingrown deformity present x 10.    Assessment: Diabetes with Angiopathy ,symptomatic and mycotic toenails, hyperkeratotic lesion x 4  Plan: Discussed treatment options and alternatives. Debrided nails without complication. Debrided hyperkeratotic lesions without complication.  Return appointment recommended at routine intervals of 3 months.

## 2014-06-30 NOTE — Patient Instructions (Signed)
Diabetes and Foot Care Diabetes may cause you to have problems because of poor blood supply (circulation) to your feet and legs. This may cause the skin on your feet to become thinner, break easier, and heal more slowly. Your skin may become dry, and the skin may peel and crack. You may also have nerve damage in your legs and feet causing decreased feeling in them. You may not notice minor injuries to your feet that could lead to infections or more serious problems. Taking care of your feet is one of the most important things you can do for yourself.  HOME CARE INSTRUCTIONS  Wear shoes at all times, even in the house. Do not go barefoot. Bare feet are easily injured.  Check your feet daily for blisters, cuts, and redness. If you cannot see the bottom of your feet, use a mirror or ask someone for help.  Wash your feet with warm water (do not use hot water) and mild soap. Then pat your feet and the areas between your toes until they are completely dry. Do not soak your feet as this can dry your skin.  Apply a moisturizing lotion or petroleum jelly (that does not contain alcohol and is unscented) to the skin on your feet and to dry, brittle toenails. Do not apply lotion between your toes.  Trim your toenails straight across. Do not dig under them or around the cuticle. File the edges of your nails with an emery board or nail file.  Do not cut corns or calluses or try to remove them with medicine.  Wear clean socks or stockings every day. Make sure they are not too tight. Do not wear knee-high stockings since they may decrease blood flow to your legs.  Wear shoes that fit properly and have enough cushioning. To break in new shoes, wear them for just a few hours a day. This prevents you from injuring your feet. Always look in your shoes before you put them on to be sure there are no objects inside.  Do not cross your legs. This may decrease the blood flow to your feet.  If you find a minor scrape,  cut, or break in the skin on your feet, keep it and the skin around it clean and dry. These areas may be cleansed with mild soap and water. Do not cleanse the area with peroxide, alcohol, or iodine.  When you remove an adhesive bandage, be sure not to damage the skin around it.  If you have a wound, look at it several times a day to make sure it is healing.  Do not use heating pads or hot water bottles. They may burn your skin. If you have lost feeling in your feet or legs, you may not know it is happening until it is too late.  Make sure your health care provider performs a complete foot exam at least annually or more often if you have foot problems. Report any cuts, sores, or bruises to your health care provider immediately. SEEK MEDICAL CARE IF:   You have an injury that is not healing.  You have cuts or breaks in the skin.  You have an ingrown nail.  You notice redness on your legs or feet.  You feel burning or tingling in your legs or feet.  You have pain or cramps in your legs and feet.  Your legs or feet are numb.  Your feet always feel cold. SEEK IMMEDIATE MEDICAL CARE IF:   There is increasing redness,   swelling, or pain in or around a wound.  There is a red line that goes up your leg.  Pus is coming from a wound.  You develop a fever or as directed by your health care provider.  You notice a bad smell coming from an ulcer or wound. Document Released: 05/03/2000 Document Revised: 01/06/2013 Document Reviewed: 10/13/2012 Exit[employer] Patient Information 2015 [employer]. This information is not intended to replace advice given to you by your health care provider. Make sure you discuss any questions you have with your health care provider.  

## 2014-07-08 ENCOUNTER — Other Ambulatory Visit: Payer: Self-pay | Admitting: Gastroenterology

## 2014-09-20 ENCOUNTER — Encounter (HOSPITAL_COMMUNITY): Payer: Self-pay

## 2014-09-20 ENCOUNTER — Ambulatory Visit (HOSPITAL_COMMUNITY): Admit: 2014-09-20 | Payer: BC Managed Care – PPO | Admitting: Gastroenterology

## 2014-09-20 SURGERY — ESOPHAGOGASTRODUODENOSCOPY (EGD) WITH PROPOFOL
Anesthesia: Monitor Anesthesia Care

## 2014-09-26 ENCOUNTER — Ambulatory Visit: Payer: Self-pay

## 2014-12-16 ENCOUNTER — Ambulatory Visit: Payer: Self-pay | Admitting: Podiatry

## 2014-12-23 ENCOUNTER — Ambulatory Visit: Payer: Self-pay | Admitting: Podiatry

## 2014-12-29 ENCOUNTER — Ambulatory Visit (INDEPENDENT_AMBULATORY_CARE_PROVIDER_SITE_OTHER): Payer: Self-pay | Admitting: Podiatry

## 2014-12-29 DIAGNOSIS — B351 Tinea unguium: Secondary | ICD-10-CM

## 2014-12-29 DIAGNOSIS — L84 Corns and callosities: Secondary | ICD-10-CM

## 2014-12-29 DIAGNOSIS — M79676 Pain in unspecified toe(s): Secondary | ICD-10-CM

## 2014-12-30 NOTE — Progress Notes (Signed)
Subjective:     Patient ID: [name], male   DOB: 1950/08/22, 64 y.o.   MRN: [id number]  HPIThis patient presents to the office for preventive foot care services.  He says due to personal circumstances he has been unable to keep his appointments.  He presents to the office for treatment of his long painful nails.  He also  has severe callus on the tip of third toe right and tip of fourth toe left foot.   Review of Systems     Objective:   Physical Exam GENERAL APPEARANCE: Alert, conversant. Appropriately groomed. No acute distress.  VASCULAR: Pedal pulses palpable at  [facility] and PT bilateral.  Capillary refill time is immediate to all digits,  Normal temperature gradient.  Digital hair growth is present bilateral  NEUROLOGIC: sensation is diminished  to 5.07 monofilament at 5/5 sites bilateral.  Light touch is intact bilateral, Muscle strength normal.  MUSCULOSKELETAL: acceptable muscle strength, tone and stability bilateral.  Intrinsic muscluature intact bilateral.  Rectus appearance of foot and digits noted bilateral. Combination of hammer toes and mallet toes both feet.  DERMATOLOGIC: skin color, texture, and turgor are within normal limits.  No preulcerative lesions or ulcers  are seen, no interdigital maceration noted.  No open lesions present.  Digital nails are asymptomatic. No drainage noted. Severe distal clavi fourth toe left and distal clavi third toe right foot.      Assessment:     Onychomycosis  Distal Clavi B/l     Plan:     Debridement of nails/Clavi

## 2015-03-08 ENCOUNTER — Encounter: Payer: Self-pay | Admitting: Podiatry

## 2015-03-08 ENCOUNTER — Ambulatory Visit (INDEPENDENT_AMBULATORY_CARE_PROVIDER_SITE_OTHER): Payer: Self-pay | Admitting: Podiatry

## 2015-03-08 DIAGNOSIS — L84 Corns and callosities: Secondary | ICD-10-CM

## 2015-03-08 DIAGNOSIS — M79676 Pain in unspecified toe(s): Secondary | ICD-10-CM

## 2015-03-08 DIAGNOSIS — B351 Tinea unguium: Secondary | ICD-10-CM

## 2015-03-08 NOTE — Progress Notes (Signed)
Subjective:     Patient ID: [name], male   DOB: 14-May-1951, 64 y.o.   MRN: [id number]  HPIThis patient presents to the office for preventive foot care services.  He says due to personal circumstances he has been unable to keep his appointments.  He presents to the office for treatment of his long painful nails.  He also  has severe callus on the tip of third toe right and tip of fourth toe left foot.   Review of Systems     Objective:   Physical Exam GENERAL APPEARANCE: Alert, conversant. Appropriately groomed. No acute distress.  VASCULAR: Pedal pulses palpable at  [facility] and PT bilateral.  Capillary refill time is immediate to all digits,  Normal temperature gradient.  Digital hair growth is present bilateral  NEUROLOGIC: sensation is diminished  to 5.07 monofilament at 5/5 sites bilateral.  Light touch is intact bilateral, Muscle strength normal.  MUSCULOSKELETAL: acceptable muscle strength, tone and stability bilateral.  Intrinsic muscluature intact bilateral.  Rectus appearance of foot and digits noted bilateral. Combination of hammer toes and mallet toes both feet.  DERMATOLOGIC: skin color, texture, and turgor are within normal limits.  No preulcerative lesions or ulcers  are seen, no interdigital maceration noted.  No open lesions present.  Digital nails are asymptomatic. No drainage noted. Severe distal clavi fourth toe left and distal clavi third toe right foot.      Assessment:     Onychomycosis  Distal Clavi B/l     Plan:     Debridement of nails/Clavi

## 2015-03-23 ENCOUNTER — Ambulatory Visit: Payer: Self-pay | Admitting: Podiatry

## 2015-05-17 ENCOUNTER — Ambulatory Visit: Payer: Self-pay | Admitting: Podiatry

## 2015-05-18 ENCOUNTER — Ambulatory Visit: Payer: Self-pay | Admitting: Podiatry

## 2015-05-25 ENCOUNTER — Ambulatory Visit (INDEPENDENT_AMBULATORY_CARE_PROVIDER_SITE_OTHER): Payer: Self-pay | Admitting: Podiatry

## 2015-05-25 ENCOUNTER — Encounter: Payer: Self-pay | Admitting: Podiatry

## 2015-05-25 DIAGNOSIS — M79676 Pain in unspecified toe(s): Secondary | ICD-10-CM

## 2015-05-25 DIAGNOSIS — L97511 Non-pressure chronic ulcer of other part of right foot limited to breakdown of skin: Secondary | ICD-10-CM

## 2015-05-25 DIAGNOSIS — E11621 Type 2 diabetes mellitus with foot ulcer: Secondary | ICD-10-CM

## 2015-05-25 DIAGNOSIS — B351 Tinea unguium: Secondary | ICD-10-CM

## 2015-05-25 DIAGNOSIS — L89891 Pressure ulcer of other site, stage 1: Secondary | ICD-10-CM

## 2015-05-25 DIAGNOSIS — L97509 Non-pressure chronic ulcer of other part of unspecified foot with unspecified severity: Secondary | ICD-10-CM

## 2015-05-25 NOTE — Progress Notes (Signed)
Subjective:     Patient ID: [name], male   DOB: 05-26-1950, 65 y.o.   MRN: [id number]  HPIThis patient presents to the office for preventive foot care services.  He says due to personal circumstances he has been unable to keep his appointments.  He presents to the office for treatment of his long painful nails.  He also  has severe callus on the tip of third toe right and tip of fourth toe left foot. Patient is diabetic.   Review of Systems     Objective:   Physical Exam GENERAL APPEARANCE: Alert, conversant. Appropriately groomed. No acute distress.  VASCULAR: Pedal pulses palpable at  [facility] and PT bilateral.  Capillary refill time is immediate to all digits,  Normal temperature gradient.  Digital hair growth is present bilateral  NEUROLOGIC: sensation is diminished  to 5.07 monofilament at 5/5 sites bilateral.  Light touch is intact bilateral, Muscle strength normal.  MUSCULOSKELETAL: acceptable muscle strength, tone and stability bilateral.  Intrinsic muscluature intact bilateral.  Rectus appearance of foot and digits noted bilateral. Combination of hammer toes and mallet toes both feet.  DERMATOLOGIC: skin color, texture, and turgor are within normal limits.  No preulcerative lesions or ulcers  are seen, no interdigital maceration noted.  No open lesions present.  Digital nails are asymptomatic. No drainage noted. Severe distal clavi fourth toe left and distal clavi third toe right foot.      Assessment:     Onychomycosis  Distal Clavi B/l  Diabetic Ulcer third toe right foot.    Plan:     Debridement of nails/Clavi   Upon debridement of distal clavi third toe right foot there was hyperkeratotic roof which  removed and an ulcer distal aspect  third toe was noted.  No evidence of redness or swelling or drainage noted.Necrotic tissue noted.The clavi was significant .  I thought he should be evaluated by our surgeons for surgery to help prevent this significant clavi from forming.   His doctor before me was Dr. Paulla Dolly who already had discussed surgery with him.  Therefore his toe was bandaged with neosporin/DSD.  Distal clavi fourth toe left foot was debrided.     Gardiner Barefoot DPM

## 2015-05-29 ENCOUNTER — Ambulatory Visit: Payer: Self-pay

## 2015-05-29 ENCOUNTER — Ambulatory Visit (INDEPENDENT_AMBULATORY_CARE_PROVIDER_SITE_OTHER): Payer: Self-pay

## 2015-05-29 ENCOUNTER — Encounter: Payer: Self-pay | Admitting: Podiatry

## 2015-05-29 ENCOUNTER — Ambulatory Visit (INDEPENDENT_AMBULATORY_CARE_PROVIDER_SITE_OTHER): Payer: Self-pay | Admitting: Podiatry

## 2015-05-29 VITALS — BP 143/88 | HR 73 | Resp 16

## 2015-05-29 DIAGNOSIS — E11621 Type 2 diabetes mellitus with foot ulcer: Secondary | ICD-10-CM

## 2015-05-29 DIAGNOSIS — E1140 Type 2 diabetes mellitus with diabetic neuropathy, unspecified: Secondary | ICD-10-CM

## 2015-05-29 DIAGNOSIS — L97509 Non-pressure chronic ulcer of other part of unspecified foot with unspecified severity: Secondary | ICD-10-CM

## 2015-05-29 DIAGNOSIS — Q828 Other specified congenital malformations of skin: Secondary | ICD-10-CM

## 2015-05-29 DIAGNOSIS — M2040 Other hammer toe(s) (acquired), unspecified foot: Secondary | ICD-10-CM

## 2015-05-31 NOTE — Progress Notes (Signed)
Subjective:     Patient ID: [name], male   DOB: 05-11-1951, 65 y.o.   MRN: [id number]  HPI patient presents stating I got bad digital disease and Dr. Verlee Monte was concerned that I'm going to need surgery   Review of Systems     Objective:   Physical Exam Neurovascular status unchanged with patient having long-term diabetes which is under reasonable control but does have diminished neurological sharp dull and vibratory and does have moderate diminishment of circulatory PT and DP status. Patient's found have severe digital deformities with distal keratotic lesions that are grown quite thick on the third digit bilateral    Assessment:     At risk diabetic with significant structural malalignment of the feet with distal deformity    Plan:     H&P and condition and x-rays reviewed with patient. I do think that this is given her require ultimate surgery but right now there is no infection presents of deep debridement of lesions was accomplished tolerated well and I will reevaluate him in a decision eventually on surgery may be necessary

## 2015-08-29 ENCOUNTER — Encounter: Payer: Self-pay | Admitting: Podiatry

## 2015-08-29 ENCOUNTER — Ambulatory Visit (INDEPENDENT_AMBULATORY_CARE_PROVIDER_SITE_OTHER): Payer: Self-pay | Admitting: Podiatry

## 2015-08-29 DIAGNOSIS — E1140 Type 2 diabetes mellitus with diabetic neuropathy, unspecified: Secondary | ICD-10-CM

## 2015-08-29 DIAGNOSIS — M79676 Pain in unspecified toe(s): Secondary | ICD-10-CM

## 2015-08-29 DIAGNOSIS — L84 Corns and callosities: Secondary | ICD-10-CM

## 2015-08-29 DIAGNOSIS — Q828 Other specified congenital malformations of skin: Secondary | ICD-10-CM

## 2015-08-29 DIAGNOSIS — B351 Tinea unguium: Secondary | ICD-10-CM

## 2015-08-29 NOTE — Progress Notes (Signed)
Subjective:     Patient ID: [name], male   DOB: July 30, 1950, 65 y.o.   MRN: [id number]  HPIThis patient presents to the office for preventive foot care services.  He says due to personal circumstances he has been unable to keep his appointments.  He presents to the office for treatment of his long painful nails.  He also  has severe callus on the tip of third toe right and tip of fourth toe left foot. Patient is diabetic.   Review of Systems     Objective:   Physical Exam GENERAL APPEARANCE: Alert, conversant. Appropriately groomed. No acute distress.  VASCULAR: Pedal pulses palpable at  [facility] and PT bilateral.  Capillary refill time is immediate to all digits,  Normal temperature gradient.  Digital hair growth is present bilateral  NEUROLOGIC: sensation is diminished  to 5.07 monofilament at 5/5 sites bilateral.  Light touch is intact bilateral, Muscle strength normal.  MUSCULOSKELETAL: acceptable muscle strength, tone and stability bilateral.  Intrinsic muscluature intact bilateral.  Rectus appearance of foot and digits noted bilateral. Combination of hammer toes and mallet toes both feet.  DERMATOLOGIC: skin color, texture, and turgor are within normal limits.  No preulcerative lesions or ulcers  are seen, no interdigital maceration noted.  No open lesions present.  Digital nails are asymptomatic. No drainage noted. Severe distal clavi fourth toe left and distal clavi third toe right foot.      Assessment:     Onychomycosis  Distal Clavi B/l  Diabetic Ulcer third toe right foot.    Plan:     Debridement of nails/Clavi   Upon debridement of distal clavi third toe right foot there was hyperkeratotic roof which  removed and an ulcer distal aspect  third toe was noted.  No evidence of redness or swelling or drainage noted.Necrotic tissue noted.The clavi was significant .  I thought he should be evaluated by our surgeons for surgery to help prevent this significant clavi from forming.   His doctor before me was Dr. Paulla Dolly who already had discussed surgery with him.  Therefore his toe was bandaged with neosporin/DSD.  Distal clavi fourth toe left foot was debrided.     Gardiner Barefoot DPM

## 2015-12-05 ENCOUNTER — Ambulatory Visit (INDEPENDENT_AMBULATORY_CARE_PROVIDER_SITE_OTHER): Payer: Self-pay | Admitting: Podiatry

## 2015-12-05 ENCOUNTER — Encounter: Payer: Self-pay | Admitting: Podiatry

## 2015-12-05 VITALS — BP 121/68 | HR 77 | Resp 16

## 2015-12-05 DIAGNOSIS — M79676 Pain in unspecified toe(s): Secondary | ICD-10-CM

## 2015-12-05 DIAGNOSIS — B351 Tinea unguium: Secondary | ICD-10-CM

## 2015-12-05 DIAGNOSIS — L84 Corns and callosities: Secondary | ICD-10-CM

## 2015-12-05 DIAGNOSIS — E1140 Type 2 diabetes mellitus with diabetic neuropathy, unspecified: Secondary | ICD-10-CM

## 2015-12-05 NOTE — Progress Notes (Signed)
Subjective:     Patient ID: [name], male   DOB: 1951-01-12, 65 y.o.   MRN: [id number]  HPIThis patient presents to the office for preventive foot care services.  He says due to personal circumstances he has been unable to keep his appointments.  He presents to the office for treatment of his long painful nails.  He also  has severe callus on the tip of third toe right and tip of fourth toe left foot. Patient is diabetic.   Review of Systems     Objective:   Physical Exam GENERAL APPEARANCE: Alert, conversant. Appropriately groomed. No acute distress.  VASCULAR: Pedal pulses palpable at  [facility] and PT bilateral.  Capillary refill time is immediate to all digits,  Normal temperature gradient.  Digital hair growth is present bilateral  NEUROLOGIC: sensation is diminished  to 5.07 monofilament at 5/5 sites bilateral.  Light touch is intact bilateral, Muscle strength normal.  MUSCULOSKELETAL: acceptable muscle strength, tone and stability bilateral.  Intrinsic muscluature intact bilateral.  Rectus appearance of foot and digits noted bilateral. Combination of hammer toes and mallet toes both feet.  DERMATOLOGIC: skin color, texture, and turgor are within normal limits.  No preulcerative lesions or ulcers  are seen, no interdigital maceration noted.  No open lesions present.  Digital nails are asymptomatic. No drainage noted. Severe distal clavi fourth toe left and distal clavi third toe right foot.      Assessment:     Onychomycosis  Distal Clavi B/l     Plan:     Debridement of nails/Clavi    No evidence of redness or swelling or drainage noted.Necrotic tissue noted.The clavi was significant .  I thought he should be evaluated by our surgeons for surgery to help prevent this significant clavi from forming.  His doctor before me was Dr. Paulla Dolly who already had discussed surgery with him.   Distal clavi fourth toe left foot was debrided.     Gardiner Barefoot DPM

## 2016-02-13 ENCOUNTER — Ambulatory Visit (INDEPENDENT_AMBULATORY_CARE_PROVIDER_SITE_OTHER): Payer: Self-pay | Admitting: Podiatry

## 2016-02-13 ENCOUNTER — Encounter: Payer: Self-pay | Admitting: Podiatry

## 2016-02-13 VITALS — BP 117/69 | HR 94 | Resp 14

## 2016-02-13 DIAGNOSIS — E1140 Type 2 diabetes mellitus with diabetic neuropathy, unspecified: Secondary | ICD-10-CM

## 2016-02-13 DIAGNOSIS — M79676 Pain in unspecified toe(s): Secondary | ICD-10-CM

## 2016-02-13 DIAGNOSIS — L84 Corns and callosities: Secondary | ICD-10-CM

## 2016-02-13 DIAGNOSIS — B351 Tinea unguium: Secondary | ICD-10-CM

## 2016-02-13 NOTE — Progress Notes (Signed)
Subjective:     Patient ID: [name], male   DOB: 10-05-1950, 65 y.o.   MRN: [id number]  HPIThis patient presents to the office for preventive foot care services.Marland Kitchen  He presents to the office for treatment of his long painful nails.  He also  has severe callus on the tip of third toe right and tip of fourth toe left foot. Patient is diabetic.   Review of Systems     Objective:   Physical Exam GENERAL APPEARANCE: Alert, conversant. Appropriately groomed. No acute distress.  VASCULAR: Pedal pulses palpable at  [facility] and PT bilateral.  Capillary refill time is immediate to all digits,  Normal temperature gradient.  Digital hair growth is present bilateral  NEUROLOGIC: sensation is diminished  to 5.07 monofilament at 5/5 sites bilateral.  Light touch is intact bilateral, Muscle strength normal.  MUSCULOSKELETAL: acceptable muscle strength, tone and stability bilateral.  Intrinsic muscluature intact bilateral.  Rectus appearance of foot and digits noted bilateral. Combination of hammer toes and mallet toes both feet.  DERMATOLOGIC: skin color, texture, and turgor are within normal limits.  No preulcerative lesions or ulcers  are seen, no interdigital maceration noted.  No open lesions present.   No drainage noted. Severe distal clavi fourth toe left and distal clavi third toe right foot.  NAILS  Thick disfigured discolored nails both feet.      Assessment:     Onychomycosis  Distal Clavi B/l     Plan:     Debridement of nails/Clavi    No evidence of redness or swelling or drainage noted.  RTC 10 weeks.     Gardiner Barefoot DPM

## 2016-05-03 ENCOUNTER — Encounter: Payer: Self-pay | Admitting: Podiatry

## 2016-05-03 ENCOUNTER — Ambulatory Visit (INDEPENDENT_AMBULATORY_CARE_PROVIDER_SITE_OTHER): Payer: Self-pay | Admitting: Podiatry

## 2016-05-03 DIAGNOSIS — E1140 Type 2 diabetes mellitus with diabetic neuropathy, unspecified: Secondary | ICD-10-CM

## 2016-05-03 DIAGNOSIS — B351 Tinea unguium: Secondary | ICD-10-CM

## 2016-05-03 DIAGNOSIS — L84 Corns and callosities: Secondary | ICD-10-CM

## 2016-05-03 DIAGNOSIS — M79676 Pain in unspecified toe(s): Secondary | ICD-10-CM

## 2016-05-03 NOTE — Progress Notes (Signed)
Subjective:     Patient ID: [name], male   DOB: December 30, 1950, 65 y.o.   MRN: [id number]  HPIThis patient presents to the office for preventive foot care services.Marland Kitchen  He presents to the office for treatment of his long painful nails.  He also  has severe callus on the tip of third toe right and tip of fourth toe left foot. Patient is diabetic.   Review of Systems     Objective:   Physical Exam GENERAL APPEARANCE: Alert, conversant. Appropriately groomed. No acute distress.  VASCULAR: Pedal pulses palpable at  [facility] and PT bilateral.  Capillary refill time is immediate to all digits,  Normal temperature gradient.  Digital hair growth is present bilateral  NEUROLOGIC: sensation is diminished  to 5.07 monofilament at 5/5 sites bilateral.  Light touch is intact bilateral, Muscle strength normal.  MUSCULOSKELETAL: acceptable muscle strength, tone and stability bilateral.  Intrinsic muscluature intact bilateral.  Rectus appearance of foot and digits noted bilateral. Combination of hammer toes and mallet toes both feet.  DERMATOLOGIC: skin color, texture, and turgor are within normal limits.  No preulcerative lesions or ulcers  are seen, no interdigital maceration noted.  No open lesions present.   No drainage noted. Severe distal clavi fourth toe left and distal clavi third toe right foot.  NAILS  Thick disfigured discolored nails both feet.      Assessment:     Onychomycosis  Distal Clavi B/l     Plan:     Debridement of nails/Clavi    No evidence of redness or swelling or drainage noted.  RTC 10 weeks.     Gardiner Barefoot DPM

## 2016-06-28 ENCOUNTER — Encounter: Payer: Self-pay | Admitting: Podiatry

## 2016-06-28 ENCOUNTER — Ambulatory Visit (INDEPENDENT_AMBULATORY_CARE_PROVIDER_SITE_OTHER): Payer: Medicare Other | Admitting: Podiatry

## 2016-06-28 DIAGNOSIS — L84 Corns and callosities: Secondary | ICD-10-CM

## 2016-06-28 DIAGNOSIS — B351 Tinea unguium: Secondary | ICD-10-CM

## 2016-06-28 DIAGNOSIS — M79676 Pain in unspecified toe(s): Secondary | ICD-10-CM

## 2016-06-28 DIAGNOSIS — E1140 Type 2 diabetes mellitus with diabetic neuropathy, unspecified: Secondary | ICD-10-CM

## 2016-06-28 NOTE — Progress Notes (Signed)
Subjective:     Patient ID: [name], male   DOB: 25-Aug-1950, 66 y.o.   MRN: [id number]  HPIThis patient presents to the office for preventive foot care services.Marland Kitchen  He presents to the office for treatment of his long painful nails.  He also  has severe callus on the tip of third toe right and tip of fourth toe left foot. Patient is diabetic.   Review of Systems     Objective:   Physical Exam GENERAL APPEARANCE: Alert, conversant. Appropriately groomed. No acute distress.  VASCULAR: Pedal pulses palpable at  [facility] and PT bilateral.  Capillary refill time is immediate to all digits,  Normal temperature gradient.  Digital hair growth is present bilateral  NEUROLOGIC: sensation is diminished  to 5.07 monofilament at 5/5 sites bilateral.  Light touch is intact bilateral, Muscle strength normal.  MUSCULOSKELETAL: acceptable muscle strength, tone and stability bilateral.  Intrinsic muscluature intact bilateral.  Rectus appearance of foot and digits noted bilateral. Combination of hammer toes and mallet toes both feet.  DERMATOLOGIC: skin color, texture, and turgor are within normal limits.  No preulcerative lesions or ulcers  are seen, no interdigital maceration noted.  No open lesions present.   No drainage noted. Severe distal clavi fourth toe left and distal clavi third toe right foot.  NAILS  Thick disfigured discolored nails both feet.      Assessment:     Onychomycosis  Distal Clavi B/l     Plan:     Debridement of nails/Clavi    No evidence of redness or swelling or drainage noted.  RTC 10 weeks.     Gardiner Barefoot DPM

## 2016-08-30 ENCOUNTER — Ambulatory Visit (INDEPENDENT_AMBULATORY_CARE_PROVIDER_SITE_OTHER): Payer: Self-pay | Admitting: Podiatry

## 2016-08-30 DIAGNOSIS — E1140 Type 2 diabetes mellitus with diabetic neuropathy, unspecified: Secondary | ICD-10-CM

## 2016-08-30 DIAGNOSIS — M79676 Pain in unspecified toe(s): Secondary | ICD-10-CM

## 2016-08-30 DIAGNOSIS — L84 Corns and callosities: Secondary | ICD-10-CM

## 2016-08-30 DIAGNOSIS — B351 Tinea unguium: Secondary | ICD-10-CM

## 2016-08-30 NOTE — Progress Notes (Signed)
Subjective:     Patient ID: [name], male   DOB: 10/08/1950, 66 y.o.   MRN: [id number]  HPIThis patient presents to the office for preventive foot care services.Marland Kitchen  He presents to the office for treatment of his long painful nails.  He also  has severe callus on the tip of third toe right and tip of fourth toe left foot. Patient is diabetic.   Review of Systems     Objective:   Physical Exam GENERAL APPEARANCE: Alert, conversant. Appropriately groomed. No acute distress.  VASCULAR: Pedal pulses palpable at  [facility] and PT bilateral.  Capillary refill time is immediate to all digits,  Normal temperature gradient.  Digital hair growth is present bilateral  NEUROLOGIC: sensation is diminished  to 5.07 monofilament at 5/5 sites bilateral.  Light touch is intact bilateral, Muscle strength normal.  MUSCULOSKELETAL: acceptable muscle strength, tone and stability bilateral.  Intrinsic muscluature intact bilateral.  Rectus appearance of foot and digits noted bilateral. Combination of hammer toes and mallet toes both feet.  DERMATOLOGIC: skin color, texture, and turgor are within normal limits.  No preulcerative lesions or ulcers  are seen, no interdigital maceration noted.  No open lesions present.   No drainage noted. Severe distal clavi fourth toe left and distal clavi third toe right foot.  NAILS  Thick disfigured discolored nails both feet.      Assessment:     Onychomycosis  Distal Clavi B/l     Plan:     Debridement of nails/Clavi    No evidence of redness or swelling or drainage noted.  RTC 10 weeks.     Gardiner Barefoot DPM

## 2016-11-08 ENCOUNTER — Ambulatory Visit (INDEPENDENT_AMBULATORY_CARE_PROVIDER_SITE_OTHER): Payer: Self-pay | Admitting: Podiatry

## 2016-11-08 ENCOUNTER — Encounter: Payer: Self-pay | Admitting: Podiatry

## 2016-11-08 DIAGNOSIS — M79676 Pain in unspecified toe(s): Secondary | ICD-10-CM

## 2016-11-08 DIAGNOSIS — E1140 Type 2 diabetes mellitus with diabetic neuropathy, unspecified: Secondary | ICD-10-CM

## 2016-11-08 DIAGNOSIS — L84 Corns and callosities: Secondary | ICD-10-CM

## 2016-11-08 DIAGNOSIS — B351 Tinea unguium: Secondary | ICD-10-CM

## 2016-11-08 NOTE — Progress Notes (Signed)
Subjective:     Patient ID: [name], male   DOB: [birth year], 66 y.o.   MRN: [id number]  HPIThis patient presents to the office for preventive foot care services.Marland Kitchen  He presents to the office for treatment of his long painful nails.  He also  has severe callus on the tip of third toe right and tip of fourth toe left foot. Patient is diabetic.   Review of Systems     Objective:   Physical Exam GENERAL APPEARANCE: Alert, conversant. Appropriately groomed. No acute distress.  VASCULAR: Pedal pulses palpable at  [facility] and PT bilateral.  Capillary refill time is immediate to all digits,  Normal temperature gradient.  Digital hair growth is present bilateral  NEUROLOGIC: sensation is diminished  to 5.07 monofilament at 5/5 sites bilateral.  Light touch is intact bilateral, Muscle strength normal.  MUSCULOSKELETAL: acceptable muscle strength, tone and stability bilateral.  Intrinsic muscluature intact bilateral.  Rectus appearance of foot and digits noted bilateral. Combination of hammer toes and mallet toes both feet.  DERMATOLOGIC: skin color, texture, and turgor are within normal limits.  No preulcerative lesions or ulcers  are seen, no interdigital maceration noted.  No open lesions present.   No drainage noted. Severe distal clavi fourth toe left and distal clavi third toe right foot.  NAILS  Thick disfigured discolored nails both feet.      Assessment:     Onychomycosis  Distal Clavi B/l     Plan:     Debridement of nails/Clavi    No evidence of redness or swelling or drainage noted.  RTC 10 weeks.     Gardiner Barefoot DPM

## 2017-01-08 ENCOUNTER — Ambulatory Visit (INDEPENDENT_AMBULATORY_CARE_PROVIDER_SITE_OTHER): Payer: Self-pay | Admitting: Podiatry

## 2017-01-08 ENCOUNTER — Encounter: Payer: Self-pay | Admitting: Podiatry

## 2017-01-08 DIAGNOSIS — E1140 Type 2 diabetes mellitus with diabetic neuropathy, unspecified: Secondary | ICD-10-CM

## 2017-01-08 DIAGNOSIS — L84 Corns and callosities: Secondary | ICD-10-CM

## 2017-01-08 DIAGNOSIS — B351 Tinea unguium: Secondary | ICD-10-CM

## 2017-01-08 DIAGNOSIS — M79676 Pain in unspecified toe(s): Secondary | ICD-10-CM

## 2017-01-08 NOTE — Progress Notes (Signed)
This patient presents the office for preventative foot care services for both feet.  He says yesterday he examined his third toe on his right foot and noticed that it was bleeding.  He says that he knew he was coming to the office today and would have examined at this visit  . He has a severe distal clavicle I on the third toe of the right foot and on the fourth toe of the left foot.  Patient also has long thick painful nails on both feet.  He presents the office today for preventative foot care services and evaluation of the bleeding clavi  third toe, right foot. This patient isw diabetic.  GENERAL APPEARANCE: Alert, conversant. Appropriately groomed. No acute distress.  VASCULAR: Pedal pulses are  palpable at  [facility] and PT bilateral.  Capillary refill time is immediate to all digits,  Normal temperature gradient.  Digital hair growth is present bilateral  NEUROLOGIC: sensation is diminished  to 5.07 monofilament at 5/5 sites bilateral.  Light touch is intact bilateral, Muscle strength normal.  MUSCULOSKELETAL: acceptable muscle strength, tone and stability bilateral.  Combination of hammer toes and mallet toes B/L. Rectus appearance of foot and digits noted bilateral.   DERMATOLOGIC: skin color, texture, and turgor are within normal limits except for his clavi  third toe right and clavi fourth toe left.  Examination of the third toe, right does reveal the presence of the clavicle I which was pulled down and there is nail. A transverse laceration noted at the medial distal aspect of the third toe.  No signs of redness, swelling or infection or drainage.   Onychomycosis.  Distal clavicle I third right and fourth left.  Diabetic ulcer, distal aspect of the third toe of the right foot.   Debridement and grinding of long thick painful mycotic nails.  Debridement of the clavi bilaterally.  Neosporin dry sterile dressing was applied to the right foot.  Home instructions were dispensed.  Patient is to call the  office if any problems occur.  Otherwise, he should return to the office in 9 weeks for preventative foot care services   Gardiner Barefoot DPM

## 2017-03-05 ENCOUNTER — Ambulatory Visit: Payer: Medicare Other | Admitting: Podiatry

## 2017-07-14 ENCOUNTER — Encounter: Payer: Self-pay | Admitting: Internal Medicine

## 2017-10-07 DIAGNOSIS — E291 Testicular hypofunction: Secondary | ICD-10-CM | POA: Diagnosis not present

## 2017-10-07 DIAGNOSIS — D649 Anemia, unspecified: Secondary | ICD-10-CM | POA: Diagnosis not present

## 2017-10-14 DIAGNOSIS — D649 Anemia, unspecified: Secondary | ICD-10-CM | POA: Diagnosis not present

## 2018-01-13 DIAGNOSIS — I10 Essential (primary) hypertension: Secondary | ICD-10-CM | POA: Diagnosis not present

## 2018-01-13 DIAGNOSIS — D509 Iron deficiency anemia, unspecified: Secondary | ICD-10-CM | POA: Diagnosis not present

## 2018-01-13 DIAGNOSIS — E118 Type 2 diabetes mellitus with unspecified complications: Secondary | ICD-10-CM | POA: Diagnosis not present

## 2018-02-03 ENCOUNTER — Ambulatory Visit: Payer: Self-pay | Admitting: Sports Medicine

## 2018-02-04 ENCOUNTER — Ambulatory Visit (INDEPENDENT_AMBULATORY_CARE_PROVIDER_SITE_OTHER): Payer: Self-pay | Admitting: Podiatry

## 2018-02-04 ENCOUNTER — Encounter: Payer: Self-pay | Admitting: Podiatry

## 2018-02-04 DIAGNOSIS — B351 Tinea unguium: Secondary | ICD-10-CM

## 2018-02-04 DIAGNOSIS — M79676 Pain in unspecified toe(s): Secondary | ICD-10-CM

## 2018-02-04 DIAGNOSIS — M2042 Other hammer toe(s) (acquired), left foot: Secondary | ICD-10-CM

## 2018-02-04 DIAGNOSIS — E1140 Type 2 diabetes mellitus with diabetic neuropathy, unspecified: Secondary | ICD-10-CM

## 2018-02-04 DIAGNOSIS — L84 Corns and callosities: Secondary | ICD-10-CM

## 2018-02-04 DIAGNOSIS — M2012 Hallux valgus (acquired), left foot: Secondary | ICD-10-CM

## 2018-02-04 NOTE — Progress Notes (Signed)
This patient presents the office for preventative foot care services of both feet.   He says he has been sick and has not been in the office for over a year.   He says his feet are painful and sore and he presents the office today for an evaluation and treatment of his feet. He says that the fourth toenail on the right foot has popped off and there has been bleeding at that site for 4 days.  He also believe has developed callus on the bottom of his big toe, right foot but denies any drainage.  Marland Kitchen He also has a chronic callus noted at the distal aspect of the fourth toe left foot which is painful walking and wearing his shoes.  Patient also says that his nails have grown thick and long since his last visit and are painful walking and wearing his shoes.   He presents the office today for preventative foot care services.  Patient states he is also interested in acquiring a new pair of diabetic shoes.  General Appearance  Alert, conversant and in no acute stress.  Vascular  Dorsalis pedis and posterior tibial  pulses are palpable  bilaterally.  Capillary return is within normal limits  bilaterally. Temperature is within normal limits  bilaterally.  Neurologic  Senn-Weinstein monofilament wire test diminished   bilaterally. Muscle power within normal limits bilaterally.  Nails Thick disfigured discolored nails with subungual debris  from hallux to fifth toes bilaterally. No evidence of bacterial infection or drainage bilaterally. Self avulsed nail 4th toe right foot with no redness or drainage noted.  Orthopedic  No limitations of motion  feet .  No crepitus or effusions noted.  HAV 1st MPJ left foot.  Contacted digits  2-4 left foot.  Hammer toe fifth toe left foot.  Skin  normotropic skin with no porokeratosis noted bilaterally.  No signs of infections or ulcers noted.  Heloma durum fifth toe left foot.  Corn fourth toe distal aspect left foot. Crusted callus noted plantar aspect right  hallux.  Onychomycosis  B/L  Corns left foot.  Diabetic with neuropathy.  ROV.  Debridement of nails  B/L.  Debride corns left foot.  . Discussed his feet with this patient.  Patient is concerned with the continual pain due to his fourth toe left foot which is deformed.  . He had been sent to Dr. Paulla Dolly for surgical evaluation and no surgery was scheduled.  He again requests surgery and he will be appointed with Dr. Jacqualyn Posey for a surgical evaluation on his left toes.   Patient also qualifies for diabetic shoes.  Patient has DPN, HAV and hammer toes.  Patient is to make an appointment with [facility] for measuring for diabetic shoes.  RTC 9 weeks for preventative foot care services.   Gardiner Barefoot DPM

## 2018-02-16 ENCOUNTER — Ambulatory Visit: Payer: Medicare Other | Admitting: Orthotics

## 2018-02-16 DIAGNOSIS — M2042 Other hammer toe(s) (acquired), left foot: Secondary | ICD-10-CM

## 2018-02-16 DIAGNOSIS — M2012 Hallux valgus (acquired), left foot: Secondary | ICD-10-CM

## 2018-02-16 NOTE — Progress Notes (Signed)

## 2018-03-03 ENCOUNTER — Encounter: Payer: Self-pay | Admitting: Podiatry

## 2018-03-03 ENCOUNTER — Ambulatory Visit (INDEPENDENT_AMBULATORY_CARE_PROVIDER_SITE_OTHER): Payer: Medicare Other | Admitting: Podiatry

## 2018-03-03 ENCOUNTER — Ambulatory Visit (INDEPENDENT_AMBULATORY_CARE_PROVIDER_SITE_OTHER): Payer: Medicare Other

## 2018-03-03 DIAGNOSIS — L97521 Non-pressure chronic ulcer of other part of left foot limited to breakdown of skin: Secondary | ICD-10-CM

## 2018-03-03 DIAGNOSIS — R609 Edema, unspecified: Secondary | ICD-10-CM

## 2018-03-03 DIAGNOSIS — M2041 Other hammer toe(s) (acquired), right foot: Secondary | ICD-10-CM

## 2018-03-03 DIAGNOSIS — M2042 Other hammer toe(s) (acquired), left foot: Secondary | ICD-10-CM

## 2018-03-03 DIAGNOSIS — Q828 Other specified congenital malformations of skin: Secondary | ICD-10-CM

## 2018-03-03 DIAGNOSIS — I739 Peripheral vascular disease, unspecified: Secondary | ICD-10-CM

## 2018-03-03 DIAGNOSIS — L97511 Non-pressure chronic ulcer of other part of right foot limited to breakdown of skin: Secondary | ICD-10-CM

## 2018-03-03 MED ORDER — MUPIROCIN 2 % EX OINT: 1.0000 "application " | TOPICAL_OINTMENT | Freq: Two times a day (BID) | CUTANEOUS | 0 refills | Status: DC

## 2018-03-04 ENCOUNTER — Other Ambulatory Visit: Payer: Self-pay | Admitting: Podiatry

## 2018-03-04 DIAGNOSIS — M2041 Other hammer toe(s) (acquired), right foot: Secondary | ICD-10-CM

## 2018-03-04 DIAGNOSIS — M2042 Other hammer toe(s) (acquired), left foot: Principal | ICD-10-CM

## 2018-03-05 ENCOUNTER — Telehealth: Payer: Self-pay | Admitting: *Deleted

## 2018-03-05 DIAGNOSIS — L84 Corns and callosities: Secondary | ICD-10-CM

## 2018-03-05 DIAGNOSIS — R609 Edema, unspecified: Secondary | ICD-10-CM

## 2018-03-05 DIAGNOSIS — R0989 Other specified symptoms and signs involving the circulatory and respiratory systems: Secondary | ICD-10-CM

## 2018-03-05 DIAGNOSIS — R238 Other skin changes: Secondary | ICD-10-CM

## 2018-03-05 NOTE — Telephone Encounter (Signed)
Faxed orders to [facility]. 

## 2018-03-05 NOTE — Telephone Encounter (Signed)
-----   Message from Trula Slade, DPM sent at 03/03/2018  4:01 PM EDT ----- Can you please order arterial studies given color changes, decreased pulses and swelling? Thanks.

## 2018-03-10 ENCOUNTER — Telehealth: Payer: Self-pay | Admitting: *Deleted

## 2018-03-10 NOTE — Telephone Encounter (Signed)
-----   Message from Trula Slade, DPM sent at 03/10/2018  7:17 AM EDT ----- When you were out I ordered an arterial duplex I thought but don't see an order. Can you please order this?? Thanks.

## 2018-03-10 NOTE — Progress Notes (Signed)
Subjective: 67 year old male presents the office today for concerns of pain to his left fourth and fifth toes he would discuss surgical intervention.  He also states he stubbed his left fifth toe couple months ago and still causing some discomfort.  He is also had some swelling to the right big toe.  He has hammertoes and he did discuss surgical intervention.  He has been seen by Dr. Paulla Dolly as well as Dr. Prudence Davidson for this previously however surgery is not been performed given digital vascular disease.Denies any systemic complaints such as fevers, chills, nausea, vomiting. No acute changes since last appointment, and no other complaints at this time.   Objective: AAO x3, NAD DP, PT pulses decreased and there is pitting edema present to the right lower extremity however there is no pain with calf compression, erythema or warmth in the calf appears to be supple. Rigid hammertoe contractures are present.  Also adductovarus present of the fifth toes. To the right hallux is a hyperkeratotic lesion.  Upon debridement there is macerated tissue and superficial wound present.  There is no probing, undermining or tunneling.  There is no surrounding erythema, ascending cellulitis.  There is no fluctuation or crepitation or any malodor. On the left fourth toe there is also a thick hyperkeratotic lesion which is pre-ulcerative. Minimal discomfort of the left fifth toe this is mostly along the PIPJ over the area of contracture.  There is no edema. No pain with calf compression, swelling, warmth, erythema  Assessment: Ulceration right hallux with pre-ulcerative callus left fourth toe with rigid hammertoe deformity, contusion left fifth toe  Plan: -All treatment options discussed with the patient including all alternatives, risks, complications.  -I sharply debrided the hyperkeratotic lesion in the left foot without any complications or bleeding.  Discussed offloading pads at all times. -Debrided the hyperkeratotic  lesion on the right hallux to reveal underlying superficial wound.  There is no signs of infection going to continue with daily dressing changes with antibiotic ointment and a bandage daily.  Offloading. -We can discuss surgical intervention however given his vascular status this needs to be checked before we even consider surgical intervention.  I am not planning of surgery at this point.  We need to continue offloading but understand that he is at risk of ulcerations and amputations with either option.  Arterial studies ordered. -Patient encouraged to call the office with any questions, concerns, change in symptoms.   Trula Slade DPM

## 2018-03-10 NOTE — Telephone Encounter (Signed)
Left message informing pt Dr. Jacqualyn Posey had ordered vascular studies with [facility] [phone] and he could contact their office to schedule the testing for his convenience.

## 2018-03-10 NOTE — Telephone Encounter (Signed)
Thanks for following up.

## 2018-03-16 ENCOUNTER — Ambulatory Visit (HOSPITAL_COMMUNITY)
Admission: RE | Admit: 2018-03-16 | Discharge: 2018-03-16 | Disposition: A | Discharge status: Home / Self Care | Payer: Self-pay | Source: Ambulatory Visit | Attending: Cardiovascular Disease | Admitting: Cardiovascular Disease

## 2018-03-24 ENCOUNTER — Encounter (HOSPITAL_COMMUNITY): Payer: Self-pay

## 2018-03-27 ENCOUNTER — Ambulatory Visit (HOSPITAL_COMMUNITY)
Admission: RE | Admit: 2018-03-27 | Discharge: 2018-03-27 | Disposition: A | Discharge status: Home / Self Care | Payer: Medicare Other | Source: Ambulatory Visit | Attending: Cardiology | Admitting: Cardiology

## 2018-03-27 DIAGNOSIS — R238 Other skin changes: Secondary | ICD-10-CM | POA: Diagnosis not present

## 2018-03-27 DIAGNOSIS — L84 Corns and callosities: Secondary | ICD-10-CM | POA: Diagnosis not present

## 2018-03-27 DIAGNOSIS — R0989 Other specified symptoms and signs involving the circulatory and respiratory systems: Secondary | ICD-10-CM | POA: Insufficient documentation

## 2018-03-27 DIAGNOSIS — R609 Edema, unspecified: Secondary | ICD-10-CM | POA: Diagnosis not present

## 2018-03-27 DIAGNOSIS — Z23 Encounter for immunization: Secondary | ICD-10-CM | POA: Diagnosis not present

## 2018-03-31 ENCOUNTER — Telehealth: Payer: Self-pay | Admitting: *Deleted

## 2018-03-31 NOTE — Telephone Encounter (Signed)
-----   Message from Trula Slade, DPM sent at 03/30/2018  8:10 AM EST ----- Val- please let him know that overall his circulation is OK however there is some decrease in the toes. Can you ask him how he is doing and make sure there are no wounds and the wound on the right has healed. If not he needs to come back to see either myself or Dr. Prudence Davidson.

## 2018-03-31 NOTE — Telephone Encounter (Addendum)
Left message for pt to call to discuss circulation results.

## 2018-04-01 NOTE — Telephone Encounter (Signed)
Left message on pt's Home/Mobile phone to call for results of circulation test.

## 2018-04-07 ENCOUNTER — Encounter: Payer: Self-pay | Admitting: *Deleted

## 2018-04-07 NOTE — Telephone Encounter (Signed)
Unable to contact pt after multiple attempts, mailed letter with Dr. Leigh Aurora review of circulation studies and request of wound status.

## 2018-04-07 NOTE — Telephone Encounter (Signed)
-----   Message from Trula Slade, DPM sent at 03/30/2018  8:10 AM EST ----- Val- please let him know that overall his circulation is OK however there is some decrease in the toes. Can you ask him how he is doing and make sure there are no wounds and the wound on the right has healed. If not he needs to come back to see either myself or Dr. Prudence Davidson.

## 2018-04-09 ENCOUNTER — Ambulatory Visit (INDEPENDENT_AMBULATORY_CARE_PROVIDER_SITE_OTHER): Payer: Medicare Other | Admitting: Podiatry

## 2018-04-09 ENCOUNTER — Telehealth: Payer: Self-pay | Admitting: *Deleted

## 2018-04-09 DIAGNOSIS — L97511 Non-pressure chronic ulcer of other part of right foot limited to breakdown of skin: Secondary | ICD-10-CM

## 2018-04-09 DIAGNOSIS — M2042 Other hammer toe(s) (acquired), left foot: Secondary | ICD-10-CM

## 2018-04-09 DIAGNOSIS — M2041 Other hammer toe(s) (acquired), right foot: Secondary | ICD-10-CM

## 2018-04-09 DIAGNOSIS — R609 Edema, unspecified: Secondary | ICD-10-CM

## 2018-04-09 NOTE — Telephone Encounter (Signed)
[facility] - [facility] scheduled pt for 04/10/2018 at 1:30pm. Faxed orders to [facility]. I informed pt of Venous Doppler appt at [facility] 04/10/2018 1:30pm

## 2018-04-10 ENCOUNTER — Ambulatory Visit (HOSPITAL_COMMUNITY)
Admission: RE | Admit: 2018-04-10 | Discharge: 2018-04-10 | Disposition: A | Discharge status: Home / Self Care | Payer: Medicare Other | Source: Ambulatory Visit | Attending: Cardiology | Admitting: Cardiology

## 2018-04-10 DIAGNOSIS — R609 Edema, unspecified: Secondary | ICD-10-CM | POA: Insufficient documentation

## 2018-04-10 DIAGNOSIS — M79661 Pain in right lower leg: Secondary | ICD-10-CM | POA: Insufficient documentation

## 2018-04-13 NOTE — Progress Notes (Signed)
Subjective: 67 year old male presents the office with concerns of bleeding to the right big toe which is been ongoing for over a week.  He states his wife noticed blood on the floor.  This is only happening 1 day and since then she has noticed any further blood or any active bleeding.  Also the left fifth toe is to have callus trimmed as well.  He denies any recent injury or trauma.  Denies any increase in swelling or redness. Denies any systemic complaints such as fevers, chills, nausea, vomiting. No acute changes since last appointment, and no other complaints at this time.   Objective: AAO x3, NAD DP/PT pulses palpable bilaterally, CRT less than 3 seconds Hammertoe deformities are present.  Hyperkeratotic lesion present at the distal aspect of the right hallux.  Upon debridement of the thick hyperkeratotic tissue there is a superficial wound measuring approximately 0.4 x 0.3 cm and is superficial without any probing to bone, undermining or tunneling.  There is no edema, erythema, drainage or pus or any fluctuation or crepitation.  Pre-ulcerative area in the left fifth toe.  No drainage or pus or any other signs of infection.  Chronic bilateral lower extremity edema present however there is increased edema to the right leg compared to the left leg and there is mild pain with calf compression on the right side.   No other open lesions or pre-ulcerative lesions.  No pain with calf compression, swelling, warmth, erythema  Assessment: 67 year old male ulcer right hallux; right leg swelling  Plan: -All treatment options discussed with the patient including all alternatives, risks, complications.  -Sharply debrided the wound on the right hallux without any complications  down to healthy tissue utilizing #312 with scalpel and tissue nipper.  I also debrided the hyperkeratotic lesion on the left fifth toe without any complications or bleeding.  Offloading pads were dispensed to the right side.  Continue  daily dressing changes.  He has mupirocin ointment at home that he can use.  Monitor for any signs or symptoms of infection call the office or go to the emergency room should any occur. -Venous duplex ordered for swelling of the right leg to rule out DVT -Patient encouraged to call the office with any questions, concerns, change in symptoms.   Return in about 10 days (around 04/19/2018).  Trula Slade DPM

## 2018-04-14 ENCOUNTER — Telehealth: Payer: Self-pay | Admitting: *Deleted

## 2018-04-14 DIAGNOSIS — E789 Disorder of lipoprotein metabolism, unspecified: Secondary | ICD-10-CM | POA: Diagnosis not present

## 2018-04-14 DIAGNOSIS — Z0000 Encounter for general adult medical examination without abnormal findings: Secondary | ICD-10-CM | POA: Diagnosis not present

## 2018-04-14 DIAGNOSIS — N401 Enlarged prostate with lower urinary tract symptoms: Secondary | ICD-10-CM | POA: Diagnosis not present

## 2018-04-14 DIAGNOSIS — D509 Iron deficiency anemia, unspecified: Secondary | ICD-10-CM | POA: Diagnosis not present

## 2018-04-14 DIAGNOSIS — N183 Chronic kidney disease, stage 3 (moderate): Secondary | ICD-10-CM | POA: Diagnosis not present

## 2018-04-14 DIAGNOSIS — E118 Type 2 diabetes mellitus with unspecified complications: Secondary | ICD-10-CM | POA: Diagnosis not present

## 2018-04-14 DIAGNOSIS — R609 Edema, unspecified: Secondary | ICD-10-CM | POA: Diagnosis not present

## 2018-04-14 DIAGNOSIS — E291 Testicular hypofunction: Secondary | ICD-10-CM | POA: Diagnosis not present

## 2018-04-14 NOTE — Telephone Encounter (Signed)
I informed pt of Dr. Leigh Aurora review of results and orders. Pt states understanding and requested copy of rx for compression hose be mailed to him. Faxed orders to [facility] for knee-length 20-42mmHg compression hose B/L. Mailed copy of the [facility] rx form to pt.

## 2018-04-14 NOTE — Telephone Encounter (Signed)
-----   Message from Trula Slade, DPM sent at 04/14/2018  9:18 AM EST ----- Val- please let him know that the ultrasound did not show any blood clot. There was some reflux (the valves not working well) that could cause swelling. Can do light compression and encourage elevation.

## 2018-04-21 ENCOUNTER — Ambulatory Visit (INDEPENDENT_AMBULATORY_CARE_PROVIDER_SITE_OTHER): Payer: Medicare Other | Admitting: Podiatry

## 2018-04-21 ENCOUNTER — Ambulatory Visit (INDEPENDENT_AMBULATORY_CARE_PROVIDER_SITE_OTHER): Payer: Medicare Other | Admitting: Orthotics

## 2018-04-21 ENCOUNTER — Encounter: Payer: Self-pay | Admitting: Podiatry

## 2018-04-21 DIAGNOSIS — M79676 Pain in unspecified toe(s): Secondary | ICD-10-CM

## 2018-04-21 DIAGNOSIS — R609 Edema, unspecified: Secondary | ICD-10-CM | POA: Diagnosis not present

## 2018-04-21 DIAGNOSIS — E1140 Type 2 diabetes mellitus with diabetic neuropathy, unspecified: Secondary | ICD-10-CM | POA: Diagnosis not present

## 2018-04-21 DIAGNOSIS — L84 Corns and callosities: Secondary | ICD-10-CM

## 2018-04-21 DIAGNOSIS — M2042 Other hammer toe(s) (acquired), left foot: Secondary | ICD-10-CM

## 2018-04-21 DIAGNOSIS — M2041 Other hammer toe(s) (acquired), right foot: Secondary | ICD-10-CM | POA: Diagnosis not present

## 2018-04-21 DIAGNOSIS — L97511 Non-pressure chronic ulcer of other part of right foot limited to breakdown of skin: Secondary | ICD-10-CM | POA: Diagnosis not present

## 2018-04-21 DIAGNOSIS — B351 Tinea unguium: Secondary | ICD-10-CM

## 2018-04-21 DIAGNOSIS — R0989 Other specified symptoms and signs involving the circulatory and respiratory systems: Secondary | ICD-10-CM

## 2018-04-21 DIAGNOSIS — E789 Disorder of lipoprotein metabolism, unspecified: Secondary | ICD-10-CM | POA: Diagnosis not present

## 2018-04-21 DIAGNOSIS — N183 Chronic kidney disease, stage 3 (moderate): Secondary | ICD-10-CM | POA: Diagnosis not present

## 2018-04-21 DIAGNOSIS — D509 Iron deficiency anemia, unspecified: Secondary | ICD-10-CM | POA: Diagnosis not present

## 2018-04-21 DIAGNOSIS — R269 Unspecified abnormalities of gait and mobility: Secondary | ICD-10-CM | POA: Diagnosis not present

## 2018-04-21 DIAGNOSIS — R296 Repeated falls: Secondary | ICD-10-CM | POA: Diagnosis not present

## 2018-04-21 DIAGNOSIS — E118 Type 2 diabetes mellitus with unspecified complications: Secondary | ICD-10-CM | POA: Diagnosis not present

## 2018-04-21 NOTE — Progress Notes (Signed)
Subjective:     Patient ID: [name], male   DOB: Nov 13, 1950, 68 y.o.   MRN: [id number]  HPIThis patient presents to the office for preventive foot care services.Marland Kitchen  He presents to the office for treatment of his long painful nails.  He also  has severe callus on the tip of third toe right and tip of fourth toe left foot. Patient is diabetic.   Review of Systems     Objective:   Physical Exam GENERAL APPEARANCE: Alert, conversant. Appropriately groomed. No acute distress.  VASCULAR: Pedal pulses palpable at  [facility] and PT bilateral.  Capillary refill time is immediate to all digits,  Normal temperature gradient.  Digital hair growth is present bilateral  NEUROLOGIC: sensation is diminished  to 5.07 monofilament at 5/5 sites bilateral.  Light touch is intact bilateral, Muscle strength normal.  MUSCULOSKELETAL: acceptable muscle strength, tone and stability bilateral.  Intrinsic muscluature intact bilateral.  Rectus appearance of foot and digits noted bilateral. Combination of hammer toes and mallet toes both feet.  DERMATOLOGIC: skin color, texture, and turgor are within normal limits.  No preulcerative lesions or ulcers  are seen, no interdigital maceration noted.  No open lesions present.   No drainage noted. Severe distal clavi fourth toe left and distal clavi third toe right foot.  NAILS  Thick disfigured discolored nails both feet.      Assessment:     Onychomycosis  Distal Clavi B/l     Plan:     Debridement of nails/Clavi    No evidence of redness or swelling or drainage noted.  RTC 10 weeks.     Gardiner Barefoot DPM

## 2018-04-27 DIAGNOSIS — H35033 Hypertensive retinopathy, bilateral: Secondary | ICD-10-CM | POA: Diagnosis not present

## 2018-04-27 DIAGNOSIS — H35371 Puckering of macula, right eye: Secondary | ICD-10-CM | POA: Diagnosis not present

## 2018-04-27 DIAGNOSIS — H25812 Combined forms of age-related cataract, left eye: Secondary | ICD-10-CM | POA: Diagnosis not present

## 2018-04-27 DIAGNOSIS — H0288B Meibomian gland dysfunction left eye, upper and lower eyelids: Secondary | ICD-10-CM | POA: Diagnosis not present

## 2018-04-27 DIAGNOSIS — H0288A Meibomian gland dysfunction right eye, upper and lower eyelids: Secondary | ICD-10-CM | POA: Diagnosis not present

## 2018-04-27 DIAGNOSIS — E113593 Type 2 diabetes mellitus with proliferative diabetic retinopathy without macular edema, bilateral: Secondary | ICD-10-CM | POA: Diagnosis not present

## 2018-04-27 DIAGNOSIS — Z961 Presence of intraocular lens: Secondary | ICD-10-CM | POA: Diagnosis not present

## 2018-04-29 DIAGNOSIS — H25812 Combined forms of age-related cataract, left eye: Secondary | ICD-10-CM | POA: Diagnosis not present

## 2018-04-29 DIAGNOSIS — Z961 Presence of intraocular lens: Secondary | ICD-10-CM | POA: Diagnosis not present

## 2018-04-29 DIAGNOSIS — E113553 Type 2 diabetes mellitus with stable proliferative diabetic retinopathy, bilateral: Secondary | ICD-10-CM | POA: Diagnosis not present

## 2018-04-29 DIAGNOSIS — H31093 Other chorioretinal scars, bilateral: Secondary | ICD-10-CM | POA: Diagnosis not present

## 2018-05-06 DIAGNOSIS — H2512 Age-related nuclear cataract, left eye: Secondary | ICD-10-CM | POA: Diagnosis not present

## 2018-05-18 DIAGNOSIS — H25812 Combined forms of age-related cataract, left eye: Secondary | ICD-10-CM | POA: Diagnosis not present

## 2018-05-18 DIAGNOSIS — H2512 Age-related nuclear cataract, left eye: Secondary | ICD-10-CM | POA: Diagnosis not present

## 2018-06-09 ENCOUNTER — Ambulatory Visit: Payer: Medicare Other | Admitting: Podiatry

## 2018-06-15 ENCOUNTER — Ambulatory Visit (INDEPENDENT_AMBULATORY_CARE_PROVIDER_SITE_OTHER): Payer: Medicare Other | Admitting: Podiatry

## 2018-06-15 DIAGNOSIS — M2041 Other hammer toe(s) (acquired), right foot: Secondary | ICD-10-CM | POA: Diagnosis not present

## 2018-06-15 DIAGNOSIS — E1140 Type 2 diabetes mellitus with diabetic neuropathy, unspecified: Secondary | ICD-10-CM

## 2018-06-15 DIAGNOSIS — R2681 Unsteadiness on feet: Secondary | ICD-10-CM | POA: Diagnosis not present

## 2018-06-15 DIAGNOSIS — Q828 Other specified congenital malformations of skin: Secondary | ICD-10-CM

## 2018-06-15 DIAGNOSIS — M2042 Other hammer toe(s) (acquired), left foot: Secondary | ICD-10-CM | POA: Diagnosis not present

## 2018-06-15 MED ORDER — GABAPENTIN 100 MG PO CAPS: 100.0000 mg | ORAL_CAPSULE | Freq: Every day | ORAL | 0 refills | Status: DC

## 2018-06-15 NOTE — Patient Instructions (Signed)
Gabapentin capsules or tablets What is this medicine? GABAPENTIN (GA ba pen tin) is used to control seizures in certain types of epilepsy. It is also used to treat certain types of nerve pain. This medicine may be used for other purposes; ask your health care provider or pharmacist if you have questions. COMMON BRAND NAME(S): Active-PAC with Gabapentin, Gabarone, Neurontin What should I tell my health care provider before I take this medicine? They need to know if you have any of these conditions: -kidney disease -suicidal thoughts, plans, or attempt; a previous suicide attempt by you or a family member -an unusual or allergic reaction to gabapentin, other medicines, foods, dyes, or preservatives -pregnant or trying to get pregnant -breast-feeding How should I use this medicine? Take this medicine by mouth with a glass of water. Follow the directions on the prescription label. You can take it with or without food. If it upsets your stomach, take it with food. Take your medicine at regular intervals. Do not take it more often than directed. Do not stop taking except on your doctor's advice. If you are directed to break the 600 or 800 mg tablets in half as part of your dose, the extra half tablet should be used for the next dose. If you have not used the extra half tablet within 28 days, it should be thrown away. A special MedGuide will be given to you by the pharmacist with each prescription and refill. Be sure to read this information carefully each time. Talk to your pediatrician regarding the use of this medicine in children. While this drug may be prescribed for children as young as 3 years for selected conditions, precautions do apply. Overdosage: If you think you have taken too much of this medicine contact a poison control center or emergency room at once. NOTE: This medicine is only for you. Do not share this medicine with others. What if I miss a dose? If you miss a dose, take it as soon  as you can. If it is almost time for your next dose, take only that dose. Do not take double or extra doses. What may interact with this medicine? Do not take this medicine with any of the following medications: -other gabapentin products This medicine may also interact with the following medications: -alcohol -antacids -antihistamines for allergy, cough and cold -certain medicines for anxiety or sleep -certain medicines for depression or psychotic disturbances -homatropine; hydrocodone -naproxen -narcotic medicines (opiates) for pain -phenothiazines like chlorpromazine, mesoridazine, prochlorperazine, thioridazine This list may not describe all possible interactions. Give your health care provider a list of all the medicines, herbs, non-prescription drugs, or dietary supplements you use. Also tell them if you smoke, drink alcohol, or use illegal drugs. Some items may interact with your medicine. What should I watch for while using this medicine? Visit your doctor or health care professional for regular checks on your progress. You may want to keep a record at home of how you feel your condition is responding to treatment. You may want to share this information with your doctor or health care professional at each visit. You should contact your doctor or health care professional if your seizures get worse or if you have any new types of seizures. Do not stop taking this medicine or any of your seizure medicines unless instructed by your doctor or health care professional. Stopping your medicine suddenly can increase your seizures or their severity. Wear a medical identification bracelet or chain if you are taking this medicine for   seizures, and carry a card that lists all your medications. You may get drowsy, dizzy, or have blurred vision. Do not drive, use machinery, or do anything that needs mental alertness until you know how this medicine affects you. To reduce dizzy or fainting spells, do not  sit or stand up quickly, especially if you are an older patient. Alcohol can increase drowsiness and dizziness. Avoid alcoholic drinks. Your mouth may get dry. Chewing sugarless gum or sucking hard candy, and drinking plenty of water will help. The use of this medicine may increase the chance of suicidal thoughts or actions. Pay special attention to how you are responding while on this medicine. Any worsening of mood, or thoughts of suicide or dying should be reported to your health care professional right away. Women who become pregnant while using this medicine may enroll in the [place] Antiepileptic Drug Pregnancy Registry by calling 1-888-233-2334. This registry collects information about the safety of antiepileptic drug use during pregnancy. What side effects may I notice from receiving this medicine? Side effects that you should report to your doctor or health care professional as soon as possible: -allergic reactions like skin rash, itching or hives, swelling of the face, lips, or tongue -worsening of mood, thoughts or actions of suicide or dying Side effects that usually do not require medical attention (report to your doctor or health care professional if they continue or are bothersome): -constipation -difficulty walking or controlling muscle movements -dizziness -nausea -slurred speech -tiredness -tremors -weight gain This list may not describe all possible side effects. Call your doctor for medical advice about side effects. You may report side effects to FDA at 1-800-FDA-1088. Where should I keep my medicine? Keep out of reach of children. This medicine may cause accidental overdose and death if it taken by other adults, children, or pets. Mix any unused medicine with a substance like cat litter or coffee grounds. Then throw the medicine away in a sealed container like a sealed bag or a coffee can with a lid. Do not use the medicine after the expiration date. Store at room  temperature between 15 and 30 degrees C (59 and 86 degrees F). NOTE: This sheet is a summary. It may not cover all possible information. If you have questions about this medicine, talk to your doctor, pharmacist, or health care provider.  2019 Elsevier/Gold Standard (2017-10-09 13:21:44)  

## 2018-06-17 NOTE — Progress Notes (Signed)
Subjective: 68 year old male presents the office today for concerns of calluses to both of his feet the tips of the toes.  He was scheduled for bleeding on the toe but he states is not bleeding but he is getting a thick callus and was have the areas trimmed.  Denies any recent injury or trauma.  He also reports that he feels that the neuropathy has been getting worse.  He has not had any treatment for neuropathy and this is been causing some balance issues for him but no recent falls. Denies any systemic complaints such as fevers, chills, nausea, vomiting. No acute changes since last appointment, and no other complaints at this time.   Objective: AAO x3, NAD DP/PT pulses palpable bilaterally, CRT less than 3 seconds Overall sensation appears to be intact but is somewhat decreased.  He does feel that neuropathy has been getting worse. Hallux malleus as well as hammertoe deformities are present.  There is a hyperkeratotic lesion present to the distal aspect of the right hallux as well as the left fourth toe.  Upon debridement there is no underlying ulceration, drainage or any signs of infection noted today. No open lesions or pre-ulcerative lesions.  No pain with calf compression, swelling, warmth, erythema  Assessment: Symptomatic hyperkeratotic lesions, neuropathy  Plan: -All treatment options discussed with the patient including all alternatives, risks, complications.  -I debrided the hyperkeratotic lesions x2 without any complications or bleeding. -Regards the neuropathy is reviewed porting some balance issues.  I will refer him to physical therapy.  Also can start a low-dose gabapentin.  We discussed side effects this medication he wishes to try this.  We can titrate up on the dose based on if he is tolerating this. -He was to hold off any surgical intervention.  Continue offloading. -Follow-up as scheduled or sooner if needed. -Patient encouraged to call the office with any questions,  concerns, change in symptoms.   Trula Slade DPM

## 2018-06-23 ENCOUNTER — Ambulatory Visit: Payer: Medicare Other | Admitting: Podiatry

## 2018-06-25 NOTE — Progress Notes (Signed)
Patient had appointment today for definitive and final diabetic shoe fitting and delivery.  Patient was seen by Velora Heckler, C.Ped.   The inserts fit well and accomplished full contact with the plantar surface of the foot bilateral; the shoes fit well and offered forefoot freedom, no noticible heel slippage, and good toe clearance w/ the insert in place.  Patient was advised to monitor of any skin irritation, breakdown.  Patient was satisfied with fit and function.

## 2018-07-14 DIAGNOSIS — I10 Essential (primary) hypertension: Secondary | ICD-10-CM | POA: Diagnosis not present

## 2018-07-14 DIAGNOSIS — E789 Disorder of lipoprotein metabolism, unspecified: Secondary | ICD-10-CM | POA: Diagnosis not present

## 2018-07-14 DIAGNOSIS — N401 Enlarged prostate with lower urinary tract symptoms: Secondary | ICD-10-CM | POA: Diagnosis not present

## 2018-07-14 DIAGNOSIS — E291 Testicular hypofunction: Secondary | ICD-10-CM | POA: Diagnosis not present

## 2018-07-14 DIAGNOSIS — D649 Anemia, unspecified: Secondary | ICD-10-CM | POA: Diagnosis not present

## 2018-07-14 DIAGNOSIS — Z125 Encounter for screening for malignant neoplasm of prostate: Secondary | ICD-10-CM | POA: Diagnosis not present

## 2018-07-14 DIAGNOSIS — E118 Type 2 diabetes mellitus with unspecified complications: Secondary | ICD-10-CM | POA: Diagnosis not present

## 2018-07-22 DIAGNOSIS — I10 Essential (primary) hypertension: Secondary | ICD-10-CM | POA: Diagnosis not present

## 2018-07-22 DIAGNOSIS — E789 Disorder of lipoprotein metabolism, unspecified: Secondary | ICD-10-CM | POA: Diagnosis not present

## 2018-07-22 DIAGNOSIS — R21 Rash and other nonspecific skin eruption: Secondary | ICD-10-CM | POA: Diagnosis not present

## 2018-07-22 DIAGNOSIS — Z125 Encounter for screening for malignant neoplasm of prostate: Secondary | ICD-10-CM | POA: Diagnosis not present

## 2018-07-22 DIAGNOSIS — D649 Anemia, unspecified: Secondary | ICD-10-CM | POA: Diagnosis not present

## 2018-07-22 DIAGNOSIS — Z0000 Encounter for general adult medical examination without abnormal findings: Secondary | ICD-10-CM | POA: Diagnosis not present

## 2018-07-22 DIAGNOSIS — N401 Enlarged prostate with lower urinary tract symptoms: Secondary | ICD-10-CM | POA: Diagnosis not present

## 2018-07-22 DIAGNOSIS — E118 Type 2 diabetes mellitus with unspecified complications: Secondary | ICD-10-CM | POA: Diagnosis not present

## 2018-07-22 DIAGNOSIS — E291 Testicular hypofunction: Secondary | ICD-10-CM | POA: Diagnosis not present

## 2018-07-24 DIAGNOSIS — R269 Unspecified abnormalities of gait and mobility: Secondary | ICD-10-CM | POA: Diagnosis not present

## 2018-07-24 DIAGNOSIS — I10 Essential (primary) hypertension: Secondary | ICD-10-CM | POA: Diagnosis not present

## 2018-07-24 DIAGNOSIS — R296 Repeated falls: Secondary | ICD-10-CM | POA: Diagnosis not present

## 2018-07-24 DIAGNOSIS — M6259 Muscle wasting and atrophy, not elsewhere classified, multiple sites: Secondary | ICD-10-CM | POA: Diagnosis not present

## 2018-07-28 DIAGNOSIS — R296 Repeated falls: Secondary | ICD-10-CM | POA: Diagnosis not present

## 2018-07-28 DIAGNOSIS — M6259 Muscle wasting and atrophy, not elsewhere classified, multiple sites: Secondary | ICD-10-CM | POA: Diagnosis not present

## 2018-07-28 DIAGNOSIS — R269 Unspecified abnormalities of gait and mobility: Secondary | ICD-10-CM | POA: Diagnosis not present

## 2018-07-28 DIAGNOSIS — I10 Essential (primary) hypertension: Secondary | ICD-10-CM | POA: Diagnosis not present

## 2018-08-05 DIAGNOSIS — M6259 Muscle wasting and atrophy, not elsewhere classified, multiple sites: Secondary | ICD-10-CM | POA: Diagnosis not present

## 2018-08-05 DIAGNOSIS — I10 Essential (primary) hypertension: Secondary | ICD-10-CM | POA: Diagnosis not present

## 2018-08-05 DIAGNOSIS — R296 Repeated falls: Secondary | ICD-10-CM | POA: Diagnosis not present

## 2018-08-05 DIAGNOSIS — R269 Unspecified abnormalities of gait and mobility: Secondary | ICD-10-CM | POA: Diagnosis not present

## 2018-08-18 ENCOUNTER — Other Ambulatory Visit: Payer: Self-pay

## 2018-08-18 ENCOUNTER — Ambulatory Visit: Payer: Medicare Other | Admitting: Podiatry

## 2018-08-18 ENCOUNTER — Encounter: Payer: Self-pay | Admitting: Podiatry

## 2018-08-18 ENCOUNTER — Ambulatory Visit (INDEPENDENT_AMBULATORY_CARE_PROVIDER_SITE_OTHER): Payer: Medicare Other | Admitting: Podiatry

## 2018-08-18 DIAGNOSIS — R269 Unspecified abnormalities of gait and mobility: Secondary | ICD-10-CM | POA: Diagnosis not present

## 2018-08-18 DIAGNOSIS — I10 Essential (primary) hypertension: Secondary | ICD-10-CM | POA: Diagnosis not present

## 2018-08-18 DIAGNOSIS — M6259 Muscle wasting and atrophy, not elsewhere classified, multiple sites: Secondary | ICD-10-CM | POA: Diagnosis not present

## 2018-08-18 DIAGNOSIS — E1140 Type 2 diabetes mellitus with diabetic neuropathy, unspecified: Secondary | ICD-10-CM | POA: Diagnosis not present

## 2018-08-18 DIAGNOSIS — B351 Tinea unguium: Secondary | ICD-10-CM

## 2018-08-18 DIAGNOSIS — Q828 Other specified congenital malformations of skin: Secondary | ICD-10-CM | POA: Diagnosis not present

## 2018-08-18 DIAGNOSIS — M79676 Pain in unspecified toe(s): Secondary | ICD-10-CM | POA: Diagnosis not present

## 2018-08-18 DIAGNOSIS — R296 Repeated falls: Secondary | ICD-10-CM | POA: Diagnosis not present

## 2018-08-18 NOTE — Progress Notes (Signed)
Subjective: 68 y.o. returns the office today for painful, elongated, thickened toenails which they cannot trim themself as well as for calluses.  Overall has been feeling better.  Has been doing physical therapy as well as which is been helpful. Denies any redness or drainage around the nails. Denies any acute changes since last appointment and no new complaints today. Denies any systemic complaints such as fevers, chills, nausea, vomiting.   PCP: Jani Gravel, MD  Objective: AAO 3, NAD DP/PT pulses palpable, CRT less than 3 seconds Nails hypertrophic, dystrophic, elongated, brittle, discolored 10. There is tenderness overlying the nails 1-5 bilaterally. There is no surrounding erythema or drainage along the nail sites. Hyperkeratotic lesions to the distal right hallux as well as left fourth toe.  Upon debridement no underlying ulceration drainage or any signs of infection. No open lesions or pre-ulcerative lesions are identified. No other areas of tenderness bilateral lower extremities. No overlying edema, erythema, increased warmth. No pain with calf compression, swelling, warmth, erythema.  Assessment: Patient presents with symptomatic onychomycosis; hyperkeratotic lesions  Plan: -Treatment options including alternatives, risks, complications were discussed -Nails sharply debrided 10 without complication/bleeding. -Hyperkeratotic lesion sharp debrided x10 without any complications or bleeding. -Discussed daily foot inspection. If there are any changes, to call the office immediately.  -Continue physical therapy. -Follow-up as scheduled or sooner if any problems are to arise. In the meantime, encouraged to call the office with any questions, concerns, changes symptoms.  Celesta Gentile, DPM

## 2018-08-21 DIAGNOSIS — R269 Unspecified abnormalities of gait and mobility: Secondary | ICD-10-CM | POA: Diagnosis not present

## 2018-08-21 DIAGNOSIS — I10 Essential (primary) hypertension: Secondary | ICD-10-CM | POA: Diagnosis not present

## 2018-08-21 DIAGNOSIS — R296 Repeated falls: Secondary | ICD-10-CM | POA: Diagnosis not present

## 2018-08-21 DIAGNOSIS — M6259 Muscle wasting and atrophy, not elsewhere classified, multiple sites: Secondary | ICD-10-CM | POA: Diagnosis not present

## 2018-08-25 DIAGNOSIS — M6259 Muscle wasting and atrophy, not elsewhere classified, multiple sites: Secondary | ICD-10-CM | POA: Diagnosis not present

## 2018-08-25 DIAGNOSIS — I10 Essential (primary) hypertension: Secondary | ICD-10-CM | POA: Diagnosis not present

## 2018-08-25 DIAGNOSIS — R296 Repeated falls: Secondary | ICD-10-CM | POA: Diagnosis not present

## 2018-08-25 DIAGNOSIS — R269 Unspecified abnormalities of gait and mobility: Secondary | ICD-10-CM | POA: Diagnosis not present

## 2018-08-28 DIAGNOSIS — R269 Unspecified abnormalities of gait and mobility: Secondary | ICD-10-CM | POA: Diagnosis not present

## 2018-08-28 DIAGNOSIS — I10 Essential (primary) hypertension: Secondary | ICD-10-CM | POA: Diagnosis not present

## 2018-08-28 DIAGNOSIS — M6259 Muscle wasting and atrophy, not elsewhere classified, multiple sites: Secondary | ICD-10-CM | POA: Diagnosis not present

## 2018-08-28 DIAGNOSIS — R296 Repeated falls: Secondary | ICD-10-CM | POA: Diagnosis not present

## 2018-09-01 DIAGNOSIS — R269 Unspecified abnormalities of gait and mobility: Secondary | ICD-10-CM | POA: Diagnosis not present

## 2018-09-01 DIAGNOSIS — R296 Repeated falls: Secondary | ICD-10-CM | POA: Diagnosis not present

## 2018-09-01 DIAGNOSIS — M6259 Muscle wasting and atrophy, not elsewhere classified, multiple sites: Secondary | ICD-10-CM | POA: Diagnosis not present

## 2018-09-01 DIAGNOSIS — I10 Essential (primary) hypertension: Secondary | ICD-10-CM | POA: Diagnosis not present

## 2018-09-04 DIAGNOSIS — R296 Repeated falls: Secondary | ICD-10-CM | POA: Diagnosis not present

## 2018-09-04 DIAGNOSIS — I10 Essential (primary) hypertension: Secondary | ICD-10-CM | POA: Diagnosis not present

## 2018-09-04 DIAGNOSIS — R269 Unspecified abnormalities of gait and mobility: Secondary | ICD-10-CM | POA: Diagnosis not present

## 2018-09-04 DIAGNOSIS — M6259 Muscle wasting and atrophy, not elsewhere classified, multiple sites: Secondary | ICD-10-CM | POA: Diagnosis not present

## 2018-09-08 DIAGNOSIS — R269 Unspecified abnormalities of gait and mobility: Secondary | ICD-10-CM | POA: Diagnosis not present

## 2018-09-08 DIAGNOSIS — I10 Essential (primary) hypertension: Secondary | ICD-10-CM | POA: Diagnosis not present

## 2018-09-08 DIAGNOSIS — M6259 Muscle wasting and atrophy, not elsewhere classified, multiple sites: Secondary | ICD-10-CM | POA: Diagnosis not present

## 2018-09-08 DIAGNOSIS — R296 Repeated falls: Secondary | ICD-10-CM | POA: Diagnosis not present

## 2018-09-08 DIAGNOSIS — E118 Type 2 diabetes mellitus with unspecified complications: Secondary | ICD-10-CM | POA: Diagnosis not present

## 2018-09-11 DIAGNOSIS — M6259 Muscle wasting and atrophy, not elsewhere classified, multiple sites: Secondary | ICD-10-CM | POA: Diagnosis not present

## 2018-09-11 DIAGNOSIS — E118 Type 2 diabetes mellitus with unspecified complications: Secondary | ICD-10-CM | POA: Diagnosis not present

## 2018-09-11 DIAGNOSIS — I10 Essential (primary) hypertension: Secondary | ICD-10-CM | POA: Diagnosis not present

## 2018-09-11 DIAGNOSIS — R296 Repeated falls: Secondary | ICD-10-CM | POA: Diagnosis not present

## 2018-09-11 DIAGNOSIS — R269 Unspecified abnormalities of gait and mobility: Secondary | ICD-10-CM | POA: Diagnosis not present

## 2018-09-15 DIAGNOSIS — R269 Unspecified abnormalities of gait and mobility: Secondary | ICD-10-CM | POA: Diagnosis not present

## 2018-09-15 DIAGNOSIS — R296 Repeated falls: Secondary | ICD-10-CM | POA: Diagnosis not present

## 2018-09-15 DIAGNOSIS — M6259 Muscle wasting and atrophy, not elsewhere classified, multiple sites: Secondary | ICD-10-CM | POA: Diagnosis not present

## 2018-09-15 DIAGNOSIS — E118 Type 2 diabetes mellitus with unspecified complications: Secondary | ICD-10-CM | POA: Diagnosis not present

## 2018-09-15 DIAGNOSIS — I10 Essential (primary) hypertension: Secondary | ICD-10-CM | POA: Diagnosis not present

## 2018-09-18 DIAGNOSIS — R269 Unspecified abnormalities of gait and mobility: Secondary | ICD-10-CM | POA: Diagnosis not present

## 2018-09-18 DIAGNOSIS — M6259 Muscle wasting and atrophy, not elsewhere classified, multiple sites: Secondary | ICD-10-CM | POA: Diagnosis not present

## 2018-09-18 DIAGNOSIS — I10 Essential (primary) hypertension: Secondary | ICD-10-CM | POA: Diagnosis not present

## 2018-09-18 DIAGNOSIS — R296 Repeated falls: Secondary | ICD-10-CM | POA: Diagnosis not present

## 2018-09-18 DIAGNOSIS — E118 Type 2 diabetes mellitus with unspecified complications: Secondary | ICD-10-CM | POA: Diagnosis not present

## 2018-09-22 DIAGNOSIS — I10 Essential (primary) hypertension: Secondary | ICD-10-CM | POA: Diagnosis not present

## 2018-09-22 DIAGNOSIS — R269 Unspecified abnormalities of gait and mobility: Secondary | ICD-10-CM | POA: Diagnosis not present

## 2018-09-22 DIAGNOSIS — R296 Repeated falls: Secondary | ICD-10-CM | POA: Diagnosis not present

## 2018-09-22 DIAGNOSIS — M6259 Muscle wasting and atrophy, not elsewhere classified, multiple sites: Secondary | ICD-10-CM | POA: Diagnosis not present

## 2018-09-22 DIAGNOSIS — E118 Type 2 diabetes mellitus with unspecified complications: Secondary | ICD-10-CM | POA: Diagnosis not present

## 2018-09-25 DIAGNOSIS — I10 Essential (primary) hypertension: Secondary | ICD-10-CM | POA: Diagnosis not present

## 2018-09-25 DIAGNOSIS — R296 Repeated falls: Secondary | ICD-10-CM | POA: Diagnosis not present

## 2018-09-25 DIAGNOSIS — R269 Unspecified abnormalities of gait and mobility: Secondary | ICD-10-CM | POA: Diagnosis not present

## 2018-09-25 DIAGNOSIS — M6259 Muscle wasting and atrophy, not elsewhere classified, multiple sites: Secondary | ICD-10-CM | POA: Diagnosis not present

## 2018-09-25 DIAGNOSIS — E118 Type 2 diabetes mellitus with unspecified complications: Secondary | ICD-10-CM | POA: Diagnosis not present

## 2018-10-02 DIAGNOSIS — R296 Repeated falls: Secondary | ICD-10-CM | POA: Diagnosis not present

## 2018-10-02 DIAGNOSIS — I10 Essential (primary) hypertension: Secondary | ICD-10-CM | POA: Diagnosis not present

## 2018-10-02 DIAGNOSIS — R269 Unspecified abnormalities of gait and mobility: Secondary | ICD-10-CM | POA: Diagnosis not present

## 2018-10-02 DIAGNOSIS — M6259 Muscle wasting and atrophy, not elsewhere classified, multiple sites: Secondary | ICD-10-CM | POA: Diagnosis not present

## 2018-10-02 DIAGNOSIS — E118 Type 2 diabetes mellitus with unspecified complications: Secondary | ICD-10-CM | POA: Diagnosis not present

## 2018-10-06 DIAGNOSIS — I10 Essential (primary) hypertension: Secondary | ICD-10-CM | POA: Diagnosis not present

## 2018-10-06 DIAGNOSIS — R269 Unspecified abnormalities of gait and mobility: Secondary | ICD-10-CM | POA: Diagnosis not present

## 2018-10-06 DIAGNOSIS — R296 Repeated falls: Secondary | ICD-10-CM | POA: Diagnosis not present

## 2018-10-06 DIAGNOSIS — M6259 Muscle wasting and atrophy, not elsewhere classified, multiple sites: Secondary | ICD-10-CM | POA: Diagnosis not present

## 2018-10-06 DIAGNOSIS — E118 Type 2 diabetes mellitus with unspecified complications: Secondary | ICD-10-CM | POA: Diagnosis not present

## 2018-10-09 DIAGNOSIS — E118 Type 2 diabetes mellitus with unspecified complications: Secondary | ICD-10-CM | POA: Diagnosis not present

## 2018-10-09 DIAGNOSIS — R296 Repeated falls: Secondary | ICD-10-CM | POA: Diagnosis not present

## 2018-10-09 DIAGNOSIS — M6259 Muscle wasting and atrophy, not elsewhere classified, multiple sites: Secondary | ICD-10-CM | POA: Diagnosis not present

## 2018-10-09 DIAGNOSIS — R269 Unspecified abnormalities of gait and mobility: Secondary | ICD-10-CM | POA: Diagnosis not present

## 2018-10-09 DIAGNOSIS — I10 Essential (primary) hypertension: Secondary | ICD-10-CM | POA: Diagnosis not present

## 2018-10-13 DIAGNOSIS — R296 Repeated falls: Secondary | ICD-10-CM | POA: Diagnosis not present

## 2018-10-13 DIAGNOSIS — M6259 Muscle wasting and atrophy, not elsewhere classified, multiple sites: Secondary | ICD-10-CM | POA: Diagnosis not present

## 2018-10-13 DIAGNOSIS — I10 Essential (primary) hypertension: Secondary | ICD-10-CM | POA: Diagnosis not present

## 2018-10-13 DIAGNOSIS — R269 Unspecified abnormalities of gait and mobility: Secondary | ICD-10-CM | POA: Diagnosis not present

## 2018-10-13 DIAGNOSIS — E118 Type 2 diabetes mellitus with unspecified complications: Secondary | ICD-10-CM | POA: Diagnosis not present

## 2018-10-20 DIAGNOSIS — E118 Type 2 diabetes mellitus with unspecified complications: Secondary | ICD-10-CM | POA: Diagnosis not present

## 2018-10-20 DIAGNOSIS — E789 Disorder of lipoprotein metabolism, unspecified: Secondary | ICD-10-CM | POA: Diagnosis not present

## 2018-10-20 DIAGNOSIS — N401 Enlarged prostate with lower urinary tract symptoms: Secondary | ICD-10-CM | POA: Diagnosis not present

## 2018-10-20 DIAGNOSIS — N183 Chronic kidney disease, stage 3 (moderate): Secondary | ICD-10-CM | POA: Diagnosis not present

## 2018-10-20 DIAGNOSIS — D509 Iron deficiency anemia, unspecified: Secondary | ICD-10-CM | POA: Diagnosis not present

## 2018-10-20 DIAGNOSIS — I10 Essential (primary) hypertension: Secondary | ICD-10-CM | POA: Diagnosis not present

## 2018-10-20 DIAGNOSIS — R269 Unspecified abnormalities of gait and mobility: Secondary | ICD-10-CM | POA: Diagnosis not present

## 2018-10-20 DIAGNOSIS — R296 Repeated falls: Secondary | ICD-10-CM | POA: Diagnosis not present

## 2018-10-20 DIAGNOSIS — E291 Testicular hypofunction: Secondary | ICD-10-CM | POA: Diagnosis not present

## 2018-10-20 DIAGNOSIS — M6259 Muscle wasting and atrophy, not elsewhere classified, multiple sites: Secondary | ICD-10-CM | POA: Diagnosis not present

## 2018-10-21 ENCOUNTER — Other Ambulatory Visit: Payer: Self-pay

## 2018-10-21 ENCOUNTER — Encounter: Payer: Self-pay | Admitting: Podiatry

## 2018-10-21 ENCOUNTER — Ambulatory Visit (INDEPENDENT_AMBULATORY_CARE_PROVIDER_SITE_OTHER): Payer: Medicare Other | Admitting: Podiatry

## 2018-10-21 VITALS — Temp 97.3°F

## 2018-10-21 DIAGNOSIS — M79676 Pain in unspecified toe(s): Secondary | ICD-10-CM

## 2018-10-21 DIAGNOSIS — B351 Tinea unguium: Secondary | ICD-10-CM | POA: Diagnosis not present

## 2018-10-21 DIAGNOSIS — M2041 Other hammer toe(s) (acquired), right foot: Secondary | ICD-10-CM

## 2018-10-21 DIAGNOSIS — E1140 Type 2 diabetes mellitus with diabetic neuropathy, unspecified: Secondary | ICD-10-CM | POA: Diagnosis not present

## 2018-10-21 DIAGNOSIS — L84 Corns and callosities: Secondary | ICD-10-CM | POA: Diagnosis not present

## 2018-10-21 DIAGNOSIS — M2042 Other hammer toe(s) (acquired), left foot: Secondary | ICD-10-CM

## 2018-10-21 NOTE — Progress Notes (Signed)
Subjective:     Patient ID: [name], male   DOB: Jan 13, 1951, 68 y.o.   MRN: [id number]  HPIThis patient presents to the office for preventive foot care services.Marland Kitchen  He presents to the office for treatment of his long painful nails.  He also  has severe callus on the tip of third toe right and tip of fourth toe left foot. Patient is diabetic.   Review of Systems     Objective:   Physical Exam GENERAL APPEARANCE: Alert, conversant. Appropriately groomed. No acute distress.  VASCULAR: Pedal pulses palpable at  [facility] and PT bilateral.  Capillary refill time is immediate to all digits,  Normal temperature gradient.  Digital hair growth is present bilateral  NEUROLOGIC: sensation is diminished  to 5.07 monofilament at 5/5 sites bilateral.  Light touch is intact bilateral, Muscle strength normal.  MUSCULOSKELETAL: acceptable muscle strength, tone and stability bilateral.  Intrinsic muscluature intact bilateral.  Rectus appearance of foot and digits noted bilateral. Combination of hammer toes and mallet toes both feet.  DERMATOLOGIC: skin color, texture, and turgor are within normal limits.  No preulcerative lesions or ulcers  are seen, no interdigital maceration noted.  No open lesions present.   No drainage noted. Severe distal clavi fourth toe left .  NAILS  Thick disfigured discolored nails both feet.      Assessment:     Onychomycosis  Distal Clavi     Plan:     Debridement of nails/Clavi    No evidence of redness or swelling or drainage noted.  RTC 10 weeks.     Gardiner Barefoot DPM

## 2018-10-23 ENCOUNTER — Ambulatory Visit: Payer: Medicare Other | Admitting: Podiatry

## 2018-10-27 DIAGNOSIS — R296 Repeated falls: Secondary | ICD-10-CM | POA: Diagnosis not present

## 2018-10-27 DIAGNOSIS — E118 Type 2 diabetes mellitus with unspecified complications: Secondary | ICD-10-CM | POA: Diagnosis not present

## 2018-10-27 DIAGNOSIS — I10 Essential (primary) hypertension: Secondary | ICD-10-CM | POA: Diagnosis not present

## 2018-10-27 DIAGNOSIS — M6259 Muscle wasting and atrophy, not elsewhere classified, multiple sites: Secondary | ICD-10-CM | POA: Diagnosis not present

## 2018-10-27 DIAGNOSIS — R269 Unspecified abnormalities of gait and mobility: Secondary | ICD-10-CM | POA: Diagnosis not present

## 2018-10-29 DIAGNOSIS — E291 Testicular hypofunction: Secondary | ICD-10-CM | POA: Diagnosis not present

## 2018-10-29 DIAGNOSIS — K219 Gastro-esophageal reflux disease without esophagitis: Secondary | ICD-10-CM | POA: Diagnosis not present

## 2018-10-29 DIAGNOSIS — E789 Disorder of lipoprotein metabolism, unspecified: Secondary | ICD-10-CM | POA: Diagnosis not present

## 2018-10-29 DIAGNOSIS — C159 Malignant neoplasm of esophagus, unspecified: Secondary | ICD-10-CM | POA: Diagnosis not present

## 2018-10-29 DIAGNOSIS — N401 Enlarged prostate with lower urinary tract symptoms: Secondary | ICD-10-CM | POA: Diagnosis not present

## 2018-10-29 DIAGNOSIS — E118 Type 2 diabetes mellitus with unspecified complications: Secondary | ICD-10-CM | POA: Diagnosis not present

## 2018-10-29 DIAGNOSIS — R269 Unspecified abnormalities of gait and mobility: Secondary | ICD-10-CM | POA: Diagnosis not present

## 2018-10-29 DIAGNOSIS — D509 Iron deficiency anemia, unspecified: Secondary | ICD-10-CM | POA: Diagnosis not present

## 2018-10-29 DIAGNOSIS — I10 Essential (primary) hypertension: Secondary | ICD-10-CM | POA: Diagnosis not present

## 2018-10-29 DIAGNOSIS — N183 Chronic kidney disease, stage 3 (moderate): Secondary | ICD-10-CM | POA: Diagnosis not present

## 2018-10-29 DIAGNOSIS — K2270 Barrett's esophagus without dysplasia: Secondary | ICD-10-CM | POA: Diagnosis not present

## 2018-10-30 DIAGNOSIS — R269 Unspecified abnormalities of gait and mobility: Secondary | ICD-10-CM | POA: Diagnosis not present

## 2018-10-30 DIAGNOSIS — M6259 Muscle wasting and atrophy, not elsewhere classified, multiple sites: Secondary | ICD-10-CM | POA: Diagnosis not present

## 2018-10-30 DIAGNOSIS — I10 Essential (primary) hypertension: Secondary | ICD-10-CM | POA: Diagnosis not present

## 2018-10-30 DIAGNOSIS — R296 Repeated falls: Secondary | ICD-10-CM | POA: Diagnosis not present

## 2018-10-30 DIAGNOSIS — E118 Type 2 diabetes mellitus with unspecified complications: Secondary | ICD-10-CM | POA: Diagnosis not present

## 2018-11-06 DIAGNOSIS — R296 Repeated falls: Secondary | ICD-10-CM | POA: Diagnosis not present

## 2018-11-06 DIAGNOSIS — I10 Essential (primary) hypertension: Secondary | ICD-10-CM | POA: Diagnosis not present

## 2018-11-06 DIAGNOSIS — R269 Unspecified abnormalities of gait and mobility: Secondary | ICD-10-CM | POA: Diagnosis not present

## 2018-11-06 DIAGNOSIS — M6259 Muscle wasting and atrophy, not elsewhere classified, multiple sites: Secondary | ICD-10-CM | POA: Diagnosis not present

## 2018-11-06 DIAGNOSIS — E118 Type 2 diabetes mellitus with unspecified complications: Secondary | ICD-10-CM | POA: Diagnosis not present

## 2018-11-10 DIAGNOSIS — R296 Repeated falls: Secondary | ICD-10-CM | POA: Diagnosis not present

## 2018-11-10 DIAGNOSIS — R269 Unspecified abnormalities of gait and mobility: Secondary | ICD-10-CM | POA: Diagnosis not present

## 2018-11-10 DIAGNOSIS — M6259 Muscle wasting and atrophy, not elsewhere classified, multiple sites: Secondary | ICD-10-CM | POA: Diagnosis not present

## 2018-11-10 DIAGNOSIS — I10 Essential (primary) hypertension: Secondary | ICD-10-CM | POA: Diagnosis not present

## 2018-11-10 DIAGNOSIS — E118 Type 2 diabetes mellitus with unspecified complications: Secondary | ICD-10-CM | POA: Diagnosis not present

## 2018-11-13 DIAGNOSIS — I10 Essential (primary) hypertension: Secondary | ICD-10-CM | POA: Diagnosis not present

## 2018-11-13 DIAGNOSIS — E118 Type 2 diabetes mellitus with unspecified complications: Secondary | ICD-10-CM | POA: Diagnosis not present

## 2018-11-13 DIAGNOSIS — R269 Unspecified abnormalities of gait and mobility: Secondary | ICD-10-CM | POA: Diagnosis not present

## 2018-11-13 DIAGNOSIS — M6259 Muscle wasting and atrophy, not elsewhere classified, multiple sites: Secondary | ICD-10-CM | POA: Diagnosis not present

## 2018-11-13 DIAGNOSIS — R296 Repeated falls: Secondary | ICD-10-CM | POA: Diagnosis not present

## 2018-11-18 ENCOUNTER — Ambulatory Visit (INDEPENDENT_AMBULATORY_CARE_PROVIDER_SITE_OTHER): Payer: Medicare Other | Admitting: Podiatry

## 2018-11-18 ENCOUNTER — Other Ambulatory Visit: Payer: Self-pay

## 2018-11-18 VITALS — Temp 98.2°F

## 2018-11-18 DIAGNOSIS — M2042 Other hammer toe(s) (acquired), left foot: Secondary | ICD-10-CM

## 2018-11-23 NOTE — Progress Notes (Signed)
   HPI: Patient presents today for evaluation of pain and tenderness to the distal end of the left fourth toe.  Patient has developed a blister on the left fourth toe for the last 4 days secondary to the silicone toe cap is been rubbing it resolved.  Patient has been dealing with a symptomatic painful callus lesion to the distal tuft of the toe secondary to hammertoe contracture for several months now.  Conservative modalities including debridement and offloading have not really improved the patient's symptoms.  He presents today for follow-up treatment evaluation  Past Medical History:  Diagnosis Date  . Barrett esophagus   . Chronic kidney disease (CKD), stage III (moderate) (HCC)   . Diabetes mellitus ([place])   . Esophageal cancer ([place])   . Gastroparesis   . GERD (gastroesophageal reflux disease)   . History of MRSA infection   . Hypertension      Physical Exam: General: The patient is alert and oriented x3 in no acute distress.  Dermatology: Skin is warm, dry and supple bilateral lower extremities. Negative for open lesions or macerations.  Superficial skin breakdown noted to the distal tuft of the left fourth toe measuring approximately 1.0 x 1.0 x 0.1 cm.  To the noted ulceration there is no exposed bone muscle tendon ligament or joint.  Wound base is granular.  No malodor noted.  Minimal amount of serous drainage noted.  Vascular: Palpable pedal pulses bilaterally. No edema or erythema noted. Capillary refill within normal limits.  Neurological: Epicritic and protective threshold diminished bilaterally.   Musculoskeletal Exam: Range of motion within normal limits to all pedal and ankle joints bilateral. Muscle strength 5/5 in all groups bilateral.  The distal tuft of the left fourth toe is very tender.  Patient also does have a semi-reducible hammertoe deformity to the left fourth toe.  Assessment: 1.  Semi-reducible hammertoe deformity fourth digit left foot 2.  Symptomatic callus  lesion noted distal tuft of the fourth digit left foot   Plan of Care:  1. Patient evaluated.  2.  Since the patient has failed multiple conservative treatment modalities regarding the pain to the distal tuft of the left fourth toe I do recommend possible flexor tenotomy today.  All possible complications and details the procedure were explained.  No guarantees were expressed or implied.  The patient would like to proceed with flexor tenotomy of the fourth digit to help lift the toe and alleviate the pressure from the end of the toe. 3.  Toe was prepped in aseptic manner and 3 cc of 2% lidocaine plain was utilized in a digital block fashion.  A surgical #11 blade was utilized to create a small stab incision and transverse tenotomy of the flexor tendon of the toe.  Dry sterile dressing was applied 4.  Keep dressings clean dry and intact until follow-up visit 5.  Return to clinic in 1 week      Edrick Kins, DPM [facility]  Dr. Edrick Kins, DPM    [address], [place] [address]                Office [phone]  Fax [phone]

## 2018-11-24 DIAGNOSIS — R296 Repeated falls: Secondary | ICD-10-CM | POA: Diagnosis not present

## 2018-11-24 DIAGNOSIS — I10 Essential (primary) hypertension: Secondary | ICD-10-CM | POA: Diagnosis not present

## 2018-11-24 DIAGNOSIS — E118 Type 2 diabetes mellitus with unspecified complications: Secondary | ICD-10-CM | POA: Diagnosis not present

## 2018-11-24 DIAGNOSIS — R269 Unspecified abnormalities of gait and mobility: Secondary | ICD-10-CM | POA: Diagnosis not present

## 2018-11-24 DIAGNOSIS — M6259 Muscle wasting and atrophy, not elsewhere classified, multiple sites: Secondary | ICD-10-CM | POA: Diagnosis not present

## 2018-11-25 ENCOUNTER — Ambulatory Visit (INDEPENDENT_AMBULATORY_CARE_PROVIDER_SITE_OTHER): Payer: Medicare Other | Admitting: Podiatry

## 2018-11-25 ENCOUNTER — Other Ambulatory Visit: Payer: Self-pay

## 2018-11-25 VITALS — Temp 98.7°F

## 2018-11-25 DIAGNOSIS — E1140 Type 2 diabetes mellitus with diabetic neuropathy, unspecified: Secondary | ICD-10-CM

## 2018-11-25 DIAGNOSIS — M2042 Other hammer toe(s) (acquired), left foot: Secondary | ICD-10-CM

## 2018-11-27 DIAGNOSIS — M6259 Muscle wasting and atrophy, not elsewhere classified, multiple sites: Secondary | ICD-10-CM | POA: Diagnosis not present

## 2018-11-27 DIAGNOSIS — R296 Repeated falls: Secondary | ICD-10-CM | POA: Diagnosis not present

## 2018-11-27 DIAGNOSIS — R269 Unspecified abnormalities of gait and mobility: Secondary | ICD-10-CM | POA: Diagnosis not present

## 2018-11-27 DIAGNOSIS — I10 Essential (primary) hypertension: Secondary | ICD-10-CM | POA: Diagnosis not present

## 2018-11-27 DIAGNOSIS — E118 Type 2 diabetes mellitus with unspecified complications: Secondary | ICD-10-CM | POA: Diagnosis not present

## 2018-11-30 NOTE — Progress Notes (Signed)
   Subjective:  Patient presents today status post flexor tenotomy 4th left. DOS: 11/18/2018. He states he is doing much better. He denies any significant pain or modifying factors. Patient is here for further evaluation and treatment.    Past Medical History:  Diagnosis Date  . Barrett esophagus   . Chronic kidney disease (CKD), stage III (moderate) (HCC)   . Diabetes mellitus ([place])   . Esophageal cancer ([place])   . Gastroparesis   . GERD (gastroesophageal reflux disease)   . History of MRSA infection   . Hypertension       Objective/Physical Exam Neurovascular status intact.  Skin incisions appear to be well coapted with sutures and staples intact. No sign of infectious process noted. No dehiscence. No active bleeding noted. Moderate edema noted to the surgical extremity.  Assessment: 1. s/p flexor tenotomy 4th left. DOS: 11/18/2018   Plan of Care:  1. Patient was evaluated.  2. Toe elevated in a more rectus position.  3. Resume wearing DM shoes.  4. Return to clinic in 8 weeks for routine care.   Lonna Cobb, physical therapist, on 9/11 which is also his anniversary.   Edrick Kins, DPM [facility]  Dr. Edrick Kins, [address]                                        [place],  [address]                Office [phone]  Fax [phone]

## 2018-12-01 DIAGNOSIS — R296 Repeated falls: Secondary | ICD-10-CM | POA: Diagnosis not present

## 2018-12-01 DIAGNOSIS — R269 Unspecified abnormalities of gait and mobility: Secondary | ICD-10-CM | POA: Diagnosis not present

## 2018-12-01 DIAGNOSIS — I10 Essential (primary) hypertension: Secondary | ICD-10-CM | POA: Diagnosis not present

## 2018-12-01 DIAGNOSIS — M6259 Muscle wasting and atrophy, not elsewhere classified, multiple sites: Secondary | ICD-10-CM | POA: Diagnosis not present

## 2018-12-01 DIAGNOSIS — E118 Type 2 diabetes mellitus with unspecified complications: Secondary | ICD-10-CM | POA: Diagnosis not present

## 2018-12-04 DIAGNOSIS — E118 Type 2 diabetes mellitus with unspecified complications: Secondary | ICD-10-CM | POA: Diagnosis not present

## 2018-12-04 DIAGNOSIS — M6259 Muscle wasting and atrophy, not elsewhere classified, multiple sites: Secondary | ICD-10-CM | POA: Diagnosis not present

## 2018-12-04 DIAGNOSIS — R269 Unspecified abnormalities of gait and mobility: Secondary | ICD-10-CM | POA: Diagnosis not present

## 2018-12-04 DIAGNOSIS — R296 Repeated falls: Secondary | ICD-10-CM | POA: Diagnosis not present

## 2018-12-04 DIAGNOSIS — I10 Essential (primary) hypertension: Secondary | ICD-10-CM | POA: Diagnosis not present

## 2018-12-08 DIAGNOSIS — I10 Essential (primary) hypertension: Secondary | ICD-10-CM | POA: Diagnosis not present

## 2018-12-08 DIAGNOSIS — R296 Repeated falls: Secondary | ICD-10-CM | POA: Diagnosis not present

## 2018-12-08 DIAGNOSIS — R269 Unspecified abnormalities of gait and mobility: Secondary | ICD-10-CM | POA: Diagnosis not present

## 2018-12-08 DIAGNOSIS — E118 Type 2 diabetes mellitus with unspecified complications: Secondary | ICD-10-CM | POA: Diagnosis not present

## 2018-12-08 DIAGNOSIS — M6259 Muscle wasting and atrophy, not elsewhere classified, multiple sites: Secondary | ICD-10-CM | POA: Diagnosis not present

## 2018-12-11 DIAGNOSIS — I10 Essential (primary) hypertension: Secondary | ICD-10-CM | POA: Diagnosis not present

## 2018-12-11 DIAGNOSIS — M6259 Muscle wasting and atrophy, not elsewhere classified, multiple sites: Secondary | ICD-10-CM | POA: Diagnosis not present

## 2018-12-11 DIAGNOSIS — R269 Unspecified abnormalities of gait and mobility: Secondary | ICD-10-CM | POA: Diagnosis not present

## 2018-12-11 DIAGNOSIS — R296 Repeated falls: Secondary | ICD-10-CM | POA: Diagnosis not present

## 2018-12-11 DIAGNOSIS — E118 Type 2 diabetes mellitus with unspecified complications: Secondary | ICD-10-CM | POA: Diagnosis not present

## 2018-12-14 DIAGNOSIS — R296 Repeated falls: Secondary | ICD-10-CM | POA: Diagnosis not present

## 2018-12-14 DIAGNOSIS — M6259 Muscle wasting and atrophy, not elsewhere classified, multiple sites: Secondary | ICD-10-CM | POA: Diagnosis not present

## 2018-12-14 DIAGNOSIS — R269 Unspecified abnormalities of gait and mobility: Secondary | ICD-10-CM | POA: Diagnosis not present

## 2018-12-14 DIAGNOSIS — I10 Essential (primary) hypertension: Secondary | ICD-10-CM | POA: Diagnosis not present

## 2018-12-14 DIAGNOSIS — E118 Type 2 diabetes mellitus with unspecified complications: Secondary | ICD-10-CM | POA: Diagnosis not present

## 2018-12-18 DIAGNOSIS — E118 Type 2 diabetes mellitus with unspecified complications: Secondary | ICD-10-CM | POA: Diagnosis not present

## 2018-12-18 DIAGNOSIS — R269 Unspecified abnormalities of gait and mobility: Secondary | ICD-10-CM | POA: Diagnosis not present

## 2018-12-18 DIAGNOSIS — R296 Repeated falls: Secondary | ICD-10-CM | POA: Diagnosis not present

## 2018-12-18 DIAGNOSIS — I10 Essential (primary) hypertension: Secondary | ICD-10-CM | POA: Diagnosis not present

## 2018-12-18 DIAGNOSIS — M6259 Muscle wasting and atrophy, not elsewhere classified, multiple sites: Secondary | ICD-10-CM | POA: Diagnosis not present

## 2018-12-22 DIAGNOSIS — E118 Type 2 diabetes mellitus with unspecified complications: Secondary | ICD-10-CM | POA: Diagnosis not present

## 2018-12-22 DIAGNOSIS — M6259 Muscle wasting and atrophy, not elsewhere classified, multiple sites: Secondary | ICD-10-CM | POA: Diagnosis not present

## 2018-12-22 DIAGNOSIS — I10 Essential (primary) hypertension: Secondary | ICD-10-CM | POA: Diagnosis not present

## 2018-12-22 DIAGNOSIS — R296 Repeated falls: Secondary | ICD-10-CM | POA: Diagnosis not present

## 2018-12-22 DIAGNOSIS — R269 Unspecified abnormalities of gait and mobility: Secondary | ICD-10-CM | POA: Diagnosis not present

## 2018-12-25 DIAGNOSIS — E118 Type 2 diabetes mellitus with unspecified complications: Secondary | ICD-10-CM | POA: Diagnosis not present

## 2018-12-25 DIAGNOSIS — R269 Unspecified abnormalities of gait and mobility: Secondary | ICD-10-CM | POA: Diagnosis not present

## 2018-12-25 DIAGNOSIS — R296 Repeated falls: Secondary | ICD-10-CM | POA: Diagnosis not present

## 2018-12-25 DIAGNOSIS — M6259 Muscle wasting and atrophy, not elsewhere classified, multiple sites: Secondary | ICD-10-CM | POA: Diagnosis not present

## 2018-12-25 DIAGNOSIS — I10 Essential (primary) hypertension: Secondary | ICD-10-CM | POA: Diagnosis not present

## 2018-12-29 DIAGNOSIS — I10 Essential (primary) hypertension: Secondary | ICD-10-CM | POA: Diagnosis not present

## 2018-12-29 DIAGNOSIS — M6259 Muscle wasting and atrophy, not elsewhere classified, multiple sites: Secondary | ICD-10-CM | POA: Diagnosis not present

## 2018-12-29 DIAGNOSIS — R296 Repeated falls: Secondary | ICD-10-CM | POA: Diagnosis not present

## 2018-12-29 DIAGNOSIS — R269 Unspecified abnormalities of gait and mobility: Secondary | ICD-10-CM | POA: Diagnosis not present

## 2018-12-29 DIAGNOSIS — E118 Type 2 diabetes mellitus with unspecified complications: Secondary | ICD-10-CM | POA: Diagnosis not present

## 2019-01-06 ENCOUNTER — Ambulatory Visit: Payer: 59 | Admitting: Podiatry

## 2019-01-22 DIAGNOSIS — C159 Malignant neoplasm of esophagus, unspecified: Secondary | ICD-10-CM | POA: Diagnosis not present

## 2019-01-22 DIAGNOSIS — I10 Essential (primary) hypertension: Secondary | ICD-10-CM | POA: Diagnosis not present

## 2019-01-22 DIAGNOSIS — N183 Chronic kidney disease, stage 3 (moderate): Secondary | ICD-10-CM | POA: Diagnosis not present

## 2019-01-22 DIAGNOSIS — Z79899 Other long term (current) drug therapy: Secondary | ICD-10-CM | POA: Diagnosis not present

## 2019-01-22 DIAGNOSIS — E785 Hyperlipidemia, unspecified: Secondary | ICD-10-CM | POA: Diagnosis not present

## 2019-01-22 DIAGNOSIS — D649 Anemia, unspecified: Secondary | ICD-10-CM | POA: Diagnosis not present

## 2019-01-22 DIAGNOSIS — D509 Iron deficiency anemia, unspecified: Secondary | ICD-10-CM | POA: Diagnosis not present

## 2019-01-22 DIAGNOSIS — Z0000 Encounter for general adult medical examination without abnormal findings: Secondary | ICD-10-CM | POA: Diagnosis not present

## 2019-01-22 DIAGNOSIS — R739 Hyperglycemia, unspecified: Secondary | ICD-10-CM | POA: Diagnosis not present

## 2019-01-27 ENCOUNTER — Ambulatory Visit: Payer: Medicare Other | Admitting: Podiatry

## 2019-01-29 DIAGNOSIS — D509 Iron deficiency anemia, unspecified: Secondary | ICD-10-CM | POA: Diagnosis not present

## 2019-01-29 DIAGNOSIS — E785 Hyperlipidemia, unspecified: Secondary | ICD-10-CM | POA: Diagnosis not present

## 2019-01-29 DIAGNOSIS — N529 Male erectile dysfunction, unspecified: Secondary | ICD-10-CM | POA: Diagnosis not present

## 2019-01-29 DIAGNOSIS — M419 Scoliosis, unspecified: Secondary | ICD-10-CM | POA: Diagnosis not present

## 2019-01-29 DIAGNOSIS — E118 Type 2 diabetes mellitus with unspecified complications: Secondary | ICD-10-CM | POA: Diagnosis not present

## 2019-01-29 DIAGNOSIS — I10 Essential (primary) hypertension: Secondary | ICD-10-CM | POA: Diagnosis not present

## 2019-02-06 ENCOUNTER — Other Ambulatory Visit: Payer: Self-pay

## 2019-02-06 DIAGNOSIS — Z20822 Contact with and (suspected) exposure to covid-19: Secondary | ICD-10-CM

## 2019-02-07 LAB — NOVEL CORONAVIRUS, NAA: SARS-CoV-2, NAA: NOT DETECTED

## 2019-02-15 ENCOUNTER — Ambulatory Visit: Payer: Medicare Other | Admitting: Podiatry

## 2019-02-15 DIAGNOSIS — M546 Pain in thoracic spine: Secondary | ICD-10-CM | POA: Diagnosis not present

## 2019-02-15 DIAGNOSIS — M545 Low back pain: Secondary | ICD-10-CM | POA: Diagnosis not present

## 2019-02-18 DIAGNOSIS — M546 Pain in thoracic spine: Secondary | ICD-10-CM | POA: Diagnosis not present

## 2019-02-18 DIAGNOSIS — M545 Low back pain: Secondary | ICD-10-CM | POA: Diagnosis not present

## 2019-02-23 DIAGNOSIS — M546 Pain in thoracic spine: Secondary | ICD-10-CM | POA: Diagnosis not present

## 2019-02-23 DIAGNOSIS — M545 Low back pain: Secondary | ICD-10-CM | POA: Diagnosis not present

## 2019-02-25 DIAGNOSIS — M546 Pain in thoracic spine: Secondary | ICD-10-CM | POA: Diagnosis not present

## 2019-02-25 DIAGNOSIS — M545 Low back pain: Secondary | ICD-10-CM | POA: Diagnosis not present

## 2019-03-02 DIAGNOSIS — M545 Low back pain: Secondary | ICD-10-CM | POA: Diagnosis not present

## 2019-03-02 DIAGNOSIS — M546 Pain in thoracic spine: Secondary | ICD-10-CM | POA: Diagnosis not present

## 2019-03-16 DIAGNOSIS — M545 Low back pain: Secondary | ICD-10-CM | POA: Diagnosis not present

## 2019-03-16 DIAGNOSIS — M546 Pain in thoracic spine: Secondary | ICD-10-CM | POA: Diagnosis not present

## 2019-03-20 ENCOUNTER — Other Ambulatory Visit: Payer: Self-pay

## 2019-03-20 DIAGNOSIS — Z20822 Contact with and (suspected) exposure to covid-19: Secondary | ICD-10-CM

## 2019-03-21 LAB — NOVEL CORONAVIRUS, NAA: SARS-CoV-2, NAA: NOT DETECTED

## 2019-03-23 DIAGNOSIS — M546 Pain in thoracic spine: Secondary | ICD-10-CM | POA: Diagnosis not present

## 2019-03-23 DIAGNOSIS — M545 Low back pain: Secondary | ICD-10-CM | POA: Diagnosis not present

## 2019-03-24 ENCOUNTER — Ambulatory Visit: Payer: Medicare HMO | Admitting: Podiatry

## 2019-03-24 ENCOUNTER — Other Ambulatory Visit: Payer: Self-pay

## 2019-03-25 DIAGNOSIS — M545 Low back pain: Secondary | ICD-10-CM | POA: Diagnosis not present

## 2019-03-25 DIAGNOSIS — M546 Pain in thoracic spine: Secondary | ICD-10-CM | POA: Diagnosis not present

## 2019-03-30 DIAGNOSIS — M546 Pain in thoracic spine: Secondary | ICD-10-CM | POA: Diagnosis not present

## 2019-03-30 DIAGNOSIS — M545 Low back pain: Secondary | ICD-10-CM | POA: Diagnosis not present

## 2019-04-01 DIAGNOSIS — M545 Low back pain: Secondary | ICD-10-CM | POA: Diagnosis not present

## 2019-04-01 DIAGNOSIS — M546 Pain in thoracic spine: Secondary | ICD-10-CM | POA: Diagnosis not present

## 2019-04-01 DIAGNOSIS — R269 Unspecified abnormalities of gait and mobility: Secondary | ICD-10-CM | POA: Diagnosis not present

## 2019-04-06 DIAGNOSIS — M545 Low back pain: Secondary | ICD-10-CM | POA: Diagnosis not present

## 2019-04-06 DIAGNOSIS — M546 Pain in thoracic spine: Secondary | ICD-10-CM | POA: Diagnosis not present

## 2019-04-06 DIAGNOSIS — R269 Unspecified abnormalities of gait and mobility: Secondary | ICD-10-CM | POA: Diagnosis not present

## 2019-04-07 DIAGNOSIS — L723 Sebaceous cyst: Secondary | ICD-10-CM | POA: Diagnosis not present

## 2019-04-07 DIAGNOSIS — E118 Type 2 diabetes mellitus with unspecified complications: Secondary | ICD-10-CM | POA: Diagnosis not present

## 2019-04-07 DIAGNOSIS — D509 Iron deficiency anemia, unspecified: Secondary | ICD-10-CM | POA: Diagnosis not present

## 2019-04-07 DIAGNOSIS — L97509 Non-pressure chronic ulcer of other part of unspecified foot with unspecified severity: Secondary | ICD-10-CM | POA: Diagnosis not present

## 2019-04-08 DIAGNOSIS — R269 Unspecified abnormalities of gait and mobility: Secondary | ICD-10-CM | POA: Diagnosis not present

## 2019-04-08 DIAGNOSIS — M545 Low back pain: Secondary | ICD-10-CM | POA: Diagnosis not present

## 2019-04-08 DIAGNOSIS — M546 Pain in thoracic spine: Secondary | ICD-10-CM | POA: Diagnosis not present

## 2019-04-13 ENCOUNTER — Other Ambulatory Visit: Payer: Self-pay

## 2019-04-13 ENCOUNTER — Ambulatory Visit: Payer: Medicare HMO | Admitting: Podiatry

## 2019-04-13 DIAGNOSIS — L97511 Non-pressure chronic ulcer of other part of right foot limited to breakdown of skin: Secondary | ICD-10-CM | POA: Diagnosis not present

## 2019-04-13 DIAGNOSIS — M79676 Pain in unspecified toe(s): Secondary | ICD-10-CM

## 2019-04-13 DIAGNOSIS — L84 Corns and callosities: Secondary | ICD-10-CM | POA: Diagnosis not present

## 2019-04-13 DIAGNOSIS — E1140 Type 2 diabetes mellitus with diabetic neuropathy, unspecified: Secondary | ICD-10-CM

## 2019-04-13 DIAGNOSIS — B351 Tinea unguium: Secondary | ICD-10-CM

## 2019-04-13 MED ORDER — MUPIROCIN 2 % EX OINT: 1.0000 "application " | TOPICAL_OINTMENT | Freq: Two times a day (BID) | CUTANEOUS | 2 refills | Status: DC

## 2019-04-20 DIAGNOSIS — M546 Pain in thoracic spine: Secondary | ICD-10-CM | POA: Diagnosis not present

## 2019-04-20 DIAGNOSIS — M545 Low back pain: Secondary | ICD-10-CM | POA: Diagnosis not present

## 2019-04-20 DIAGNOSIS — R269 Unspecified abnormalities of gait and mobility: Secondary | ICD-10-CM | POA: Diagnosis not present

## 2019-04-20 NOTE — Progress Notes (Signed)
Subjective: 68 y.o. returns the office today for concerns of a thick callus, wound on the right big toe.  He states that he has been bleeding some but denies any drainage or pus.  No swelling redness.  Is been ongoing for some time has been worsening.  Is been keeping Neosporin on the area daily.  He also presents today for elongated, thickened toenails which they cannot trim themself. Denies any redness or drainage around the nails. Denies any acute changes since last appointment and no new complaints today. Denies any systemic complaints such as fevers, chills, nausea, vomiting.   PCP: Jani Gravel, MD  Objective: AAO 3, NAD DP/PT pulses palpable, CRT less than 3 seconds Nails hypertrophic, dystrophic, elongated, brittle, discolored 10. There is tenderness overlying the nails 1-5 bilaterally. There is no surrounding erythema or drainage along the nail sites. Thick hyperkeratotic lesion on the right hallux.  Upon debridement there is macerated tissue but there is no definitive open sore.  There is no active bleeding. No open lesions or pre-ulcerative lesions are identified. No other areas of tenderness bilateral lower extremities. No overlying edema, erythema, increased warmth. No pain with calf compression, swelling, warmth, erythema.  Assessment: Patient presents with symptomatic onychomycosis; preulcerative area right hallux  Plan: -Treatment options including alternatives, risks, complications were discussed -Nails sharply debrided 10 without complication/bleeding. -Today the hyperkeratotic lesion right hallux without any complications.  Continue daily dressing changes and offloading.  Monitor for any skin breakdown. -Discussed daily foot inspection. If there are any changes, to call the office immediately.  -Follow-up as scheduled or sooner if any problems are to arise. In the meantime, encouraged to call the office with any questions, concerns, changes symptoms.  Celesta Gentile,  DPM

## 2019-04-22 DIAGNOSIS — R269 Unspecified abnormalities of gait and mobility: Secondary | ICD-10-CM | POA: Diagnosis not present

## 2019-04-22 DIAGNOSIS — M545 Low back pain: Secondary | ICD-10-CM | POA: Diagnosis not present

## 2019-04-22 DIAGNOSIS — M546 Pain in thoracic spine: Secondary | ICD-10-CM | POA: Diagnosis not present

## 2019-04-23 DIAGNOSIS — Z79899 Other long term (current) drug therapy: Secondary | ICD-10-CM | POA: Diagnosis not present

## 2019-04-23 DIAGNOSIS — R945 Abnormal results of liver function studies: Secondary | ICD-10-CM | POA: Diagnosis not present

## 2019-04-23 DIAGNOSIS — E118 Type 2 diabetes mellitus with unspecified complications: Secondary | ICD-10-CM | POA: Diagnosis not present

## 2019-04-23 DIAGNOSIS — E291 Testicular hypofunction: Secondary | ICD-10-CM | POA: Diagnosis not present

## 2019-04-23 DIAGNOSIS — R748 Abnormal levels of other serum enzymes: Secondary | ICD-10-CM | POA: Diagnosis not present

## 2019-04-23 DIAGNOSIS — I10 Essential (primary) hypertension: Secondary | ICD-10-CM | POA: Diagnosis not present

## 2019-04-23 DIAGNOSIS — M064 Inflammatory polyarthropathy: Secondary | ICD-10-CM | POA: Diagnosis not present

## 2019-04-26 DIAGNOSIS — R269 Unspecified abnormalities of gait and mobility: Secondary | ICD-10-CM | POA: Diagnosis not present

## 2019-04-26 DIAGNOSIS — M546 Pain in thoracic spine: Secondary | ICD-10-CM | POA: Diagnosis not present

## 2019-04-26 DIAGNOSIS — M545 Low back pain: Secondary | ICD-10-CM | POA: Diagnosis not present

## 2019-05-03 ENCOUNTER — Other Ambulatory Visit: Payer: Self-pay

## 2019-05-03 ENCOUNTER — Ambulatory Visit (INDEPENDENT_AMBULATORY_CARE_PROVIDER_SITE_OTHER): Payer: Medicare HMO | Admitting: Podiatry

## 2019-05-03 ENCOUNTER — Telehealth: Payer: Self-pay | Admitting: *Deleted

## 2019-05-03 DIAGNOSIS — M79661 Pain in right lower leg: Secondary | ICD-10-CM

## 2019-05-03 DIAGNOSIS — R609 Edema, unspecified: Secondary | ICD-10-CM | POA: Diagnosis not present

## 2019-05-03 DIAGNOSIS — L97511 Non-pressure chronic ulcer of other part of right foot limited to breakdown of skin: Secondary | ICD-10-CM

## 2019-05-03 DIAGNOSIS — E1140 Type 2 diabetes mellitus with diabetic neuropathy, unspecified: Secondary | ICD-10-CM

## 2019-05-03 MED ORDER — DOXYCYCLINE HYCLATE 100 MG PO TABS: 100.0000 mg | ORAL_TABLET | Freq: Two times a day (BID) | ORAL | 0 refills | Status: DC

## 2019-05-04 ENCOUNTER — Ambulatory Visit (HOSPITAL_COMMUNITY): Admission: RE | Admit: 2019-05-04 | Payer: Medicare HMO | Source: Ambulatory Visit

## 2019-05-10 ENCOUNTER — Other Ambulatory Visit (HOSPITAL_COMMUNITY): Payer: Self-pay

## 2019-05-10 ENCOUNTER — Other Ambulatory Visit: Payer: Self-pay

## 2019-05-10 ENCOUNTER — Ambulatory Visit (HOSPITAL_COMMUNITY)
Admission: RE | Admit: 2019-05-10 | Discharge: 2019-05-10 | Disposition: A | Discharge status: Home / Self Care | Payer: Medicare HMO | Source: Ambulatory Visit | Attending: Cardiology | Admitting: Cardiology

## 2019-05-10 ENCOUNTER — Other Ambulatory Visit (HOSPITAL_COMMUNITY): Payer: Self-pay | Admitting: Podiatry

## 2019-05-10 DIAGNOSIS — M79661 Pain in right lower leg: Secondary | ICD-10-CM | POA: Diagnosis not present

## 2019-05-10 DIAGNOSIS — M7989 Other specified soft tissue disorders: Secondary | ICD-10-CM

## 2019-05-11 DIAGNOSIS — R269 Unspecified abnormalities of gait and mobility: Secondary | ICD-10-CM | POA: Diagnosis not present

## 2019-05-11 DIAGNOSIS — M546 Pain in thoracic spine: Secondary | ICD-10-CM | POA: Diagnosis not present

## 2019-05-11 DIAGNOSIS — M545 Low back pain: Secondary | ICD-10-CM | POA: Diagnosis not present

## 2019-05-11 NOTE — Telephone Encounter (Signed)
-----   Message from Trula Slade, DPM sent at 05/11/2019  7:03 AM EST ----- Negative- please let the patient know. Thanks.

## 2019-05-11 NOTE — Telephone Encounter (Signed)
I informed pt of Dr. Wagoner's review of results. 

## 2019-05-12 DIAGNOSIS — Z1211 Encounter for screening for malignant neoplasm of colon: Secondary | ICD-10-CM | POA: Diagnosis not present

## 2019-05-12 DIAGNOSIS — K219 Gastro-esophageal reflux disease without esophagitis: Secondary | ICD-10-CM | POA: Diagnosis not present

## 2019-05-12 DIAGNOSIS — E1165 Type 2 diabetes mellitus with hyperglycemia: Secondary | ICD-10-CM | POA: Diagnosis not present

## 2019-05-12 NOTE — Progress Notes (Signed)
Subjective: 68 y.o. returns the office today for follow-up evaluation of a wound on the right big toe.  He denies any drainage or pus coming from the area but he does report swelling to the right leg.  He denies any erythema or warmth associated the swelling or to the toe.  He denies any recent injury.  Denies any fevers, chills, nausea, vomiting.  No calf pain, chest pain, shortness of breath.  No other concerns today.  PCP: Jani Gravel, MD  Objective: AAO 3, NAD DP/PT pulses palpable, CRT less than 3 seconds Thick hyperkeratotic lesion on the right hallux.  Upon debridement today there was a superficial wound measuring 0.6 x 0.5 x 0.1 cm.  There is no probing, undermining or tunneling.  There is no erythema or warmth.  There is swelling to the right foot and leg without any pain with calf compression erythema or warmth. No open lesions or pre-ulcerative lesions are identified. No pain with calf compression, swelling, warmth, erythema.  Assessment: Patient presents with right hallux ulceration, increased right leg swelling  Plan: -Treatment options including alternatives, risks, complications were discussed -Sharply debrided the ulcerations without any complications utilizing #715 with scalpel.  Given the increase in swelling I would order doxycycline.  Also ordered a venous duplex rule out DVT.  Encouraged elevation.  Offloading at all times.  Return in about 2 weeks (around 05/17/2019).  Trula Slade DPM

## 2019-05-18 DIAGNOSIS — R269 Unspecified abnormalities of gait and mobility: Secondary | ICD-10-CM | POA: Diagnosis not present

## 2019-05-18 DIAGNOSIS — M545 Low back pain: Secondary | ICD-10-CM | POA: Diagnosis not present

## 2019-05-18 DIAGNOSIS — M546 Pain in thoracic spine: Secondary | ICD-10-CM | POA: Diagnosis not present

## 2019-05-25 DIAGNOSIS — R269 Unspecified abnormalities of gait and mobility: Secondary | ICD-10-CM | POA: Diagnosis not present

## 2019-05-25 DIAGNOSIS — M546 Pain in thoracic spine: Secondary | ICD-10-CM | POA: Diagnosis not present

## 2019-05-25 DIAGNOSIS — M545 Low back pain: Secondary | ICD-10-CM | POA: Diagnosis not present

## 2019-05-27 DIAGNOSIS — Z1211 Encounter for screening for malignant neoplasm of colon: Secondary | ICD-10-CM | POA: Diagnosis not present

## 2019-05-27 DIAGNOSIS — K219 Gastro-esophageal reflux disease without esophagitis: Secondary | ICD-10-CM | POA: Diagnosis not present

## 2019-05-27 DIAGNOSIS — D131 Benign neoplasm of stomach: Secondary | ICD-10-CM | POA: Diagnosis not present

## 2019-05-27 DIAGNOSIS — K317 Polyp of stomach and duodenum: Secondary | ICD-10-CM | POA: Diagnosis not present

## 2019-06-01 DIAGNOSIS — R269 Unspecified abnormalities of gait and mobility: Secondary | ICD-10-CM | POA: Diagnosis not present

## 2019-06-01 DIAGNOSIS — M546 Pain in thoracic spine: Secondary | ICD-10-CM | POA: Diagnosis not present

## 2019-06-01 DIAGNOSIS — M545 Low back pain: Secondary | ICD-10-CM | POA: Diagnosis not present

## 2019-06-03 DIAGNOSIS — R269 Unspecified abnormalities of gait and mobility: Secondary | ICD-10-CM | POA: Diagnosis not present

## 2019-06-03 DIAGNOSIS — M546 Pain in thoracic spine: Secondary | ICD-10-CM | POA: Diagnosis not present

## 2019-06-03 DIAGNOSIS — M545 Low back pain: Secondary | ICD-10-CM | POA: Diagnosis not present

## 2019-06-08 DIAGNOSIS — M545 Low back pain: Secondary | ICD-10-CM | POA: Diagnosis not present

## 2019-06-08 DIAGNOSIS — M546 Pain in thoracic spine: Secondary | ICD-10-CM | POA: Diagnosis not present

## 2019-06-08 DIAGNOSIS — R269 Unspecified abnormalities of gait and mobility: Secondary | ICD-10-CM | POA: Diagnosis not present

## 2019-06-10 DIAGNOSIS — R269 Unspecified abnormalities of gait and mobility: Secondary | ICD-10-CM | POA: Diagnosis not present

## 2019-06-10 DIAGNOSIS — M545 Low back pain: Secondary | ICD-10-CM | POA: Diagnosis not present

## 2019-06-10 DIAGNOSIS — M546 Pain in thoracic spine: Secondary | ICD-10-CM | POA: Diagnosis not present

## 2019-06-15 DIAGNOSIS — R269 Unspecified abnormalities of gait and mobility: Secondary | ICD-10-CM | POA: Diagnosis not present

## 2019-06-15 DIAGNOSIS — M545 Low back pain: Secondary | ICD-10-CM | POA: Diagnosis not present

## 2019-06-15 DIAGNOSIS — M546 Pain in thoracic spine: Secondary | ICD-10-CM | POA: Diagnosis not present

## 2019-06-17 DIAGNOSIS — R269 Unspecified abnormalities of gait and mobility: Secondary | ICD-10-CM | POA: Diagnosis not present

## 2019-06-17 DIAGNOSIS — M545 Low back pain: Secondary | ICD-10-CM | POA: Diagnosis not present

## 2019-06-17 DIAGNOSIS — M546 Pain in thoracic spine: Secondary | ICD-10-CM | POA: Diagnosis not present

## 2019-06-19 ENCOUNTER — Ambulatory Visit: Payer: Medicare HMO

## 2019-06-22 DIAGNOSIS — M545 Low back pain: Secondary | ICD-10-CM | POA: Diagnosis not present

## 2019-06-22 DIAGNOSIS — R269 Unspecified abnormalities of gait and mobility: Secondary | ICD-10-CM | POA: Diagnosis not present

## 2019-06-22 DIAGNOSIS — M546 Pain in thoracic spine: Secondary | ICD-10-CM | POA: Diagnosis not present

## 2019-06-24 DIAGNOSIS — M545 Low back pain: Secondary | ICD-10-CM | POA: Diagnosis not present

## 2019-06-24 DIAGNOSIS — R269 Unspecified abnormalities of gait and mobility: Secondary | ICD-10-CM | POA: Diagnosis not present

## 2019-06-24 DIAGNOSIS — M546 Pain in thoracic spine: Secondary | ICD-10-CM | POA: Diagnosis not present

## 2019-06-27 ENCOUNTER — Ambulatory Visit: Payer: Medicare PPO | Attending: Internal Medicine

## 2019-06-27 DIAGNOSIS — Z23 Encounter for immunization: Secondary | ICD-10-CM | POA: Insufficient documentation

## 2019-06-27 NOTE — Progress Notes (Signed)
   Covid-19 Vaccination Clinic  Name:  [name]    MRN: [id number] DOB: 05/02/1951  06/27/2019  [name] was observed post Covid-19 immunization for 15 minutes without incidence. He was provided with Vaccine Information Sheet and instruction to access the V-Safe system.   [name] was instructed to call 911 with any severe reactions post vaccine: Marland Kitchen Difficulty breathing  . Swelling of your face and throat  . A fast heartbeat  . A bad rash all over your body  . Dizziness and weakness    Immunizations Administered    Name Date Dose VIS Date Route   Pfizer COVID-19 Vaccine 06/27/2019  8:33 AM 0.3 mL 04/30/2019 Intramuscular   Manufacturer: [place]   Lot: [id number]   [place]: [id number]

## 2019-06-29 DIAGNOSIS — M546 Pain in thoracic spine: Secondary | ICD-10-CM | POA: Diagnosis not present

## 2019-06-29 DIAGNOSIS — M545 Low back pain: Secondary | ICD-10-CM | POA: Diagnosis not present

## 2019-06-29 DIAGNOSIS — R269 Unspecified abnormalities of gait and mobility: Secondary | ICD-10-CM | POA: Diagnosis not present

## 2019-06-30 ENCOUNTER — Ambulatory Visit: Payer: Medicare HMO

## 2019-07-01 DIAGNOSIS — M546 Pain in thoracic spine: Secondary | ICD-10-CM | POA: Diagnosis not present

## 2019-07-01 DIAGNOSIS — M545 Low back pain: Secondary | ICD-10-CM | POA: Diagnosis not present

## 2019-07-01 DIAGNOSIS — R269 Unspecified abnormalities of gait and mobility: Secondary | ICD-10-CM | POA: Diagnosis not present

## 2019-07-12 DIAGNOSIS — Z7984 Long term (current) use of oral hypoglycemic drugs: Secondary | ICD-10-CM | POA: Diagnosis not present

## 2019-07-12 DIAGNOSIS — I10 Essential (primary) hypertension: Secondary | ICD-10-CM | POA: Diagnosis not present

## 2019-07-12 DIAGNOSIS — M40209 Unspecified kyphosis, site unspecified: Secondary | ICD-10-CM | POA: Diagnosis not present

## 2019-07-12 DIAGNOSIS — D509 Iron deficiency anemia, unspecified: Secondary | ICD-10-CM | POA: Diagnosis not present

## 2019-07-12 DIAGNOSIS — Z6826 Body mass index (BMI) 26.0-26.9, adult: Secondary | ICD-10-CM | POA: Diagnosis not present

## 2019-07-12 DIAGNOSIS — N400 Enlarged prostate without lower urinary tract symptoms: Secondary | ICD-10-CM | POA: Diagnosis not present

## 2019-07-12 DIAGNOSIS — M545 Low back pain: Secondary | ICD-10-CM | POA: Diagnosis not present

## 2019-07-12 DIAGNOSIS — E119 Type 2 diabetes mellitus without complications: Secondary | ICD-10-CM | POA: Diagnosis not present

## 2019-07-12 DIAGNOSIS — K219 Gastro-esophageal reflux disease without esophagitis: Secondary | ICD-10-CM | POA: Diagnosis not present

## 2019-07-13 DIAGNOSIS — M545 Low back pain: Secondary | ICD-10-CM | POA: Diagnosis not present

## 2019-07-13 DIAGNOSIS — R269 Unspecified abnormalities of gait and mobility: Secondary | ICD-10-CM | POA: Diagnosis not present

## 2019-07-13 DIAGNOSIS — M546 Pain in thoracic spine: Secondary | ICD-10-CM | POA: Diagnosis not present

## 2019-07-15 DIAGNOSIS — M546 Pain in thoracic spine: Secondary | ICD-10-CM | POA: Diagnosis not present

## 2019-07-15 DIAGNOSIS — R269 Unspecified abnormalities of gait and mobility: Secondary | ICD-10-CM | POA: Diagnosis not present

## 2019-07-15 DIAGNOSIS — M545 Low back pain: Secondary | ICD-10-CM | POA: Diagnosis not present

## 2019-07-20 DIAGNOSIS — M546 Pain in thoracic spine: Secondary | ICD-10-CM | POA: Diagnosis not present

## 2019-07-20 DIAGNOSIS — M545 Low back pain: Secondary | ICD-10-CM | POA: Diagnosis not present

## 2019-07-20 DIAGNOSIS — R269 Unspecified abnormalities of gait and mobility: Secondary | ICD-10-CM | POA: Diagnosis not present

## 2019-07-22 ENCOUNTER — Ambulatory Visit: Payer: Medicare PPO | Attending: Internal Medicine

## 2019-07-22 DIAGNOSIS — Z23 Encounter for immunization: Secondary | ICD-10-CM | POA: Insufficient documentation

## 2019-07-22 NOTE — Progress Notes (Signed)
   Covid-19 Vaccination Clinic  Name:  MAIKA KACZMAREK    MRN: [id number] DOB: [birth year]  07/22/2019  Mr. Offord was observed post Covid-19 immunization for 15 minutes without incident. He was provided with Vaccine Information Sheet and instruction to access the V-Safe system.   Mr. Beehler was instructed to call 911 with any severe reactions post vaccine: Marland Kitchen Difficulty breathing  . Swelling of face and throat  . A fast heartbeat  . A bad rash all over body  . Dizziness and weakness   Immunizations Administered    Name Date Dose VIS Date Route   Pfizer COVID-19 Vaccine 07/22/2019 10:09 AM 0.3 mL 04/30/2019 Intramuscular   Manufacturer: [place]   Lot: [id number]   [place]: [id number]

## 2019-07-27 DIAGNOSIS — M546 Pain in thoracic spine: Secondary | ICD-10-CM | POA: Diagnosis not present

## 2019-07-27 DIAGNOSIS — M545 Low back pain: Secondary | ICD-10-CM | POA: Diagnosis not present

## 2019-07-27 DIAGNOSIS — R269 Unspecified abnormalities of gait and mobility: Secondary | ICD-10-CM | POA: Diagnosis not present

## 2019-07-29 DIAGNOSIS — M545 Low back pain: Secondary | ICD-10-CM | POA: Diagnosis not present

## 2019-07-29 DIAGNOSIS — M546 Pain in thoracic spine: Secondary | ICD-10-CM | POA: Diagnosis not present

## 2019-07-29 DIAGNOSIS — R269 Unspecified abnormalities of gait and mobility: Secondary | ICD-10-CM | POA: Diagnosis not present

## 2019-08-02 DIAGNOSIS — E785 Hyperlipidemia, unspecified: Secondary | ICD-10-CM | POA: Diagnosis not present

## 2019-08-02 DIAGNOSIS — D509 Iron deficiency anemia, unspecified: Secondary | ICD-10-CM | POA: Diagnosis not present

## 2019-08-02 DIAGNOSIS — I10 Essential (primary) hypertension: Secondary | ICD-10-CM | POA: Diagnosis not present

## 2019-08-02 DIAGNOSIS — E118 Type 2 diabetes mellitus with unspecified complications: Secondary | ICD-10-CM | POA: Diagnosis not present

## 2019-08-03 DIAGNOSIS — D509 Iron deficiency anemia, unspecified: Secondary | ICD-10-CM | POA: Diagnosis not present

## 2019-08-09 DIAGNOSIS — D509 Iron deficiency anemia, unspecified: Secondary | ICD-10-CM | POA: Diagnosis not present

## 2019-08-09 DIAGNOSIS — Z0000 Encounter for general adult medical examination without abnormal findings: Secondary | ICD-10-CM | POA: Diagnosis not present

## 2019-08-09 DIAGNOSIS — N401 Enlarged prostate with lower urinary tract symptoms: Secondary | ICD-10-CM | POA: Diagnosis not present

## 2019-08-09 DIAGNOSIS — I10 Essential (primary) hypertension: Secondary | ICD-10-CM | POA: Diagnosis not present

## 2019-08-09 DIAGNOSIS — E291 Testicular hypofunction: Secondary | ICD-10-CM | POA: Diagnosis not present

## 2019-08-30 ENCOUNTER — Ambulatory Visit: Payer: Medicare PPO | Admitting: Podiatry

## 2019-08-30 ENCOUNTER — Other Ambulatory Visit: Payer: Self-pay

## 2019-08-30 DIAGNOSIS — B351 Tinea unguium: Secondary | ICD-10-CM | POA: Diagnosis not present

## 2019-08-30 DIAGNOSIS — L989 Disorder of the skin and subcutaneous tissue, unspecified: Secondary | ICD-10-CM | POA: Diagnosis not present

## 2019-08-30 DIAGNOSIS — E1140 Type 2 diabetes mellitus with diabetic neuropathy, unspecified: Secondary | ICD-10-CM | POA: Diagnosis not present

## 2019-08-30 DIAGNOSIS — M79676 Pain in unspecified toe(s): Secondary | ICD-10-CM

## 2019-08-30 NOTE — Patient Instructions (Signed)
Amlactin Lotion at the pharmacy

## 2019-08-31 DIAGNOSIS — M546 Pain in thoracic spine: Secondary | ICD-10-CM | POA: Diagnosis not present

## 2019-08-31 DIAGNOSIS — R269 Unspecified abnormalities of gait and mobility: Secondary | ICD-10-CM | POA: Diagnosis not present

## 2019-08-31 DIAGNOSIS — M545 Low back pain: Secondary | ICD-10-CM | POA: Diagnosis not present

## 2019-09-05 NOTE — Progress Notes (Signed)
    Subjective: Patient is a 69 y.o. male presenting to the office today with a chief complaint of painful callus lesion(s) noted to the bilateral feet that have been present for the past several months. Walking and bearing weight increases the pain. He has not had any treatment for the symptoms.  Patient also complains of elongated, thickened nails that cause pain while ambulating in shoes. He is unable to trim his own nails. Patient presents today for further treatment and evaluation.  Past Medical History:  Diagnosis Date  . Barrett esophagus   . Chronic kidney disease (CKD), stage III (moderate) (HCC)   . Diabetes mellitus ([place])   . Esophageal cancer ([place])   . Gastroparesis   . GERD (gastroesophageal reflux disease)   . History of MRSA infection   . Hypertension     Objective:  Physical Exam General: Alert and oriented x3 in no acute distress  Dermatology: Hyperkeratotic lesion(s) present on the bilateral feet. Pain on palpation with a central nucleated core noted. Skin is warm, dry and supple bilateral lower extremities. Negative for open lesions or macerations. Nails are tender, long, thickened and dystrophic with subungual debris, consistent with onychomycosis, 1-5 bilateral. No signs of infection noted.  Vascular: Palpable pedal pulses bilaterally. No edema or erythema noted. Capillary refill within normal limits.  Neurological: Epicritic and protective threshold diminished bilaterally.   Musculoskeletal Exam: Pain on palpation at the keratotic lesion(s) noted. Range of motion within normal limits bilateral. Muscle strength 5/5 in all groups bilateral.  Assessment: 1. Onychodystrophic nails 1-5 bilateral with hyperkeratosis of nails.  2. Onychomycosis of nail due to dermatophyte bilateral 3. Pre-ulcerative callus lesions noted to the bilateral feet   Plan of Care:  1. Patient evaluated. 2. Excisional debridement of keratoic lesion(s) using a chisel blade was performed  without incident.  3. Dressed with light dressing. 4. Mechanical debridement of nails 1-5 bilaterally performed using a nail nipper. Filed with dremel without incident.  5. Patient is to return to the clinic in 3 months.   Edrick Kins, DPM [facility]  Dr. Edrick Kins, [address]                                        [place], [place] [address]                Office [phone]  Fax [phone]

## 2019-09-09 DIAGNOSIS — M545 Low back pain: Secondary | ICD-10-CM | POA: Diagnosis not present

## 2019-09-09 DIAGNOSIS — R269 Unspecified abnormalities of gait and mobility: Secondary | ICD-10-CM | POA: Diagnosis not present

## 2019-09-09 DIAGNOSIS — M546 Pain in thoracic spine: Secondary | ICD-10-CM | POA: Diagnosis not present

## 2019-09-14 DIAGNOSIS — R269 Unspecified abnormalities of gait and mobility: Secondary | ICD-10-CM | POA: Diagnosis not present

## 2019-09-14 DIAGNOSIS — M546 Pain in thoracic spine: Secondary | ICD-10-CM | POA: Diagnosis not present

## 2019-09-14 DIAGNOSIS — M545 Low back pain: Secondary | ICD-10-CM | POA: Diagnosis not present

## 2019-09-21 DIAGNOSIS — M546 Pain in thoracic spine: Secondary | ICD-10-CM | POA: Diagnosis not present

## 2019-09-21 DIAGNOSIS — M545 Low back pain: Secondary | ICD-10-CM | POA: Diagnosis not present

## 2019-09-21 DIAGNOSIS — R269 Unspecified abnormalities of gait and mobility: Secondary | ICD-10-CM | POA: Diagnosis not present

## 2019-09-23 DIAGNOSIS — R269 Unspecified abnormalities of gait and mobility: Secondary | ICD-10-CM | POA: Diagnosis not present

## 2019-09-23 DIAGNOSIS — M546 Pain in thoracic spine: Secondary | ICD-10-CM | POA: Diagnosis not present

## 2019-09-23 DIAGNOSIS — M545 Low back pain: Secondary | ICD-10-CM | POA: Diagnosis not present

## 2019-09-28 DIAGNOSIS — R269 Unspecified abnormalities of gait and mobility: Secondary | ICD-10-CM | POA: Diagnosis not present

## 2019-09-28 DIAGNOSIS — M546 Pain in thoracic spine: Secondary | ICD-10-CM | POA: Diagnosis not present

## 2019-09-28 DIAGNOSIS — M545 Low back pain: Secondary | ICD-10-CM | POA: Diagnosis not present

## 2019-09-30 DIAGNOSIS — M546 Pain in thoracic spine: Secondary | ICD-10-CM | POA: Diagnosis not present

## 2019-09-30 DIAGNOSIS — M545 Low back pain: Secondary | ICD-10-CM | POA: Diagnosis not present

## 2019-09-30 DIAGNOSIS — R269 Unspecified abnormalities of gait and mobility: Secondary | ICD-10-CM | POA: Diagnosis not present

## 2019-11-02 DIAGNOSIS — Z79899 Other long term (current) drug therapy: Secondary | ICD-10-CM | POA: Diagnosis not present

## 2019-11-02 DIAGNOSIS — E118 Type 2 diabetes mellitus with unspecified complications: Secondary | ICD-10-CM | POA: Diagnosis not present

## 2019-11-02 DIAGNOSIS — R296 Repeated falls: Secondary | ICD-10-CM | POA: Diagnosis not present

## 2019-11-02 DIAGNOSIS — D509 Iron deficiency anemia, unspecified: Secondary | ICD-10-CM | POA: Diagnosis not present

## 2019-11-02 DIAGNOSIS — Z125 Encounter for screening for malignant neoplasm of prostate: Secondary | ICD-10-CM | POA: Diagnosis not present

## 2019-11-02 DIAGNOSIS — I10 Essential (primary) hypertension: Secondary | ICD-10-CM | POA: Diagnosis not present

## 2019-11-02 DIAGNOSIS — C159 Malignant neoplasm of esophagus, unspecified: Secondary | ICD-10-CM | POA: Diagnosis not present

## 2019-11-02 DIAGNOSIS — E785 Hyperlipidemia, unspecified: Secondary | ICD-10-CM | POA: Diagnosis not present

## 2019-11-02 DIAGNOSIS — K2270 Barrett's esophagus without dysplasia: Secondary | ICD-10-CM | POA: Diagnosis not present

## 2019-11-02 DIAGNOSIS — E291 Testicular hypofunction: Secondary | ICD-10-CM | POA: Diagnosis not present

## 2019-11-08 DIAGNOSIS — N401 Enlarged prostate with lower urinary tract symptoms: Secondary | ICD-10-CM | POA: Diagnosis not present

## 2019-11-08 DIAGNOSIS — R350 Frequency of micturition: Secondary | ICD-10-CM | POA: Diagnosis not present

## 2019-11-08 DIAGNOSIS — K219 Gastro-esophageal reflux disease without esophagitis: Secondary | ICD-10-CM | POA: Diagnosis not present

## 2019-11-08 DIAGNOSIS — E118 Type 2 diabetes mellitus with unspecified complications: Secondary | ICD-10-CM | POA: Diagnosis not present

## 2019-11-29 ENCOUNTER — Ambulatory Visit: Payer: Medicare PPO | Admitting: Podiatry

## 2019-12-22 ENCOUNTER — Ambulatory Visit: Payer: Medicare PPO | Admitting: Podiatry

## 2019-12-22 DIAGNOSIS — R410 Disorientation, unspecified: Secondary | ICD-10-CM | POA: Diagnosis not present

## 2019-12-22 DIAGNOSIS — Z9181 History of falling: Secondary | ICD-10-CM | POA: Diagnosis not present

## 2019-12-22 DIAGNOSIS — I959 Hypotension, unspecified: Secondary | ICD-10-CM | POA: Diagnosis not present

## 2019-12-22 DIAGNOSIS — R2681 Unsteadiness on feet: Secondary | ICD-10-CM | POA: Diagnosis not present

## 2019-12-28 ENCOUNTER — Ambulatory Visit: Payer: Medicare PPO | Admitting: Neurology

## 2019-12-28 ENCOUNTER — Encounter: Payer: Self-pay | Admitting: Neurology

## 2019-12-28 VITALS — BP 96/70 | HR 90 | Ht 77.0 in | Wt 192.0 lb

## 2019-12-28 DIAGNOSIS — R2689 Other abnormalities of gait and mobility: Secondary | ICD-10-CM

## 2019-12-28 DIAGNOSIS — M6289 Other specified disorders of muscle: Secondary | ICD-10-CM | POA: Diagnosis not present

## 2019-12-28 DIAGNOSIS — R4189 Other symptoms and signs involving cognitive functions and awareness: Secondary | ICD-10-CM | POA: Diagnosis not present

## 2019-12-28 DIAGNOSIS — R269 Unspecified abnormalities of gait and mobility: Secondary | ICD-10-CM | POA: Diagnosis not present

## 2019-12-28 DIAGNOSIS — W19XXXA Unspecified fall, initial encounter: Secondary | ICD-10-CM

## 2019-12-28 DIAGNOSIS — G20 Parkinson's disease: Secondary | ICD-10-CM | POA: Diagnosis not present

## 2019-12-28 NOTE — Patient Instructions (Addendum)
MRI of the brain and cervical spine to evaluate structure DAT Scan for parkinson's spectrum diseases(Dopamine Transport Scan)   Fall Prevention in the Home, Adult Falls can cause injuries. They can happen to people of all ages. There are many things you can do to make your home safe and to help prevent falls. Ask for help when making these changes, if needed. What actions can I take to prevent falls? General Instructions  Use good lighting in all rooms. Replace any light bulbs that burn out.  Turn on the lights when you go into a dark area. Use night-lights.  Keep items that you use often in easy-to-reach places. Lower the shelves around your home if necessary.  Set up your furniture so you have a clear path. Avoid moving your furniture around.  Do not have throw rugs and other things on the floor that can make you trip.  Avoid walking on wet floors.  If any of your floors are uneven, fix them.  Add color or contrast paint or tape to clearly mark and help you see: ? Any grab bars or handrails. ? First and last steps of stairways. ? Where the edge of each step is.  If you use a stepladder: ? Make sure that it is fully opened. Do not climb a closed stepladder. ? Make sure that both sides of the stepladder are locked into place. ? Ask someone to hold the stepladder for you while you use it.  If there are any pets around you, be aware of where they are. What can I do in the bathroom?      Keep the floor dry. Clean up any water that spills onto the floor as soon as it happens.  Remove soap buildup in the tub or shower regularly.  Use non-skid mats or decals on the floor of the tub or shower.  Attach bath mats securely with double-sided, non-slip rug tape.  If you need to sit down in the shower, use a plastic, non-slip stool.  Install grab bars by the toilet and in the tub and shower. Do not use towel bars as grab bars. What can I do in the bedroom?  Make sure that you  have a light by your bed that is easy to reach.  Do not use any sheets or blankets that are too big for your bed. They should not hang down onto the floor.  Have a firm chair that has side arms. You can use this for support while you get dressed. What can I do in the kitchen?  Clean up any spills right away.  If you need to reach something above you, use a strong step stool that has a grab bar.  Keep electrical cords out of the way.  Do not use floor polish or wax that makes floors slippery. If you must use wax, use non-skid floor wax. What can I do with my stairs?  Do not leave any items on the stairs.  Make sure that you have a light switch at the top of the stairs and the bottom of the stairs. If you do not have them, ask someone to add them for you.  Make sure that there are handrails on both sides of the stairs, and use them. Fix handrails that are broken or loose. Make sure that handrails are as long as the stairways.  Install non-slip stair treads on all stairs in your home.  Avoid having throw rugs at the top or bottom of the stairs.  If you do have throw rugs, attach them to the floor with carpet tape.  Choose a carpet that does not hide the edge of the steps on the stairway.  Check any carpeting to make sure that it is firmly attached to the stairs. Fix any carpet that is loose or worn. What can I do on the outside of my home?  Use bright outdoor lighting.  Regularly fix the edges of walkways and driveways and fix any cracks.  Remove anything that might make you trip as you walk through a door, such as a raised step or threshold.  Trim any bushes or trees on the path to your home.  Regularly check to see if handrails are loose or broken. Make sure that both sides of any steps have handrails.  Install guardrails along the edges of any raised decks and porches.  Clear walking paths of anything that might make someone trip, such as tools or rocks.  Have any leaves,  snow, or ice cleared regularly.  Use sand or salt on walking paths during winter.  Clean up any spills in your garage right away. This includes grease or oil spills. What other actions can I take?  Wear shoes that: ? Have a low heel. Do not wear high heels. ? Have rubber bottoms. ? Are comfortable and fit you well. ? Are closed at the toe. Do not wear open-toe sandals.  Use tools that help you move around (mobility aids) if they are needed. These include: ? Canes. ? Walkers. ? Scooters. ? Crutches.  Review your medicines with your doctor. Some medicines can make you feel dizzy. This can increase your chance of falling. Ask your doctor what other things you can do to help prevent falls. Where to find more information  Centers for Disease Control and Prevention, STEADI: https://garcia.biz/  [employer] on Aging: BrainJudge.co.uk Contact a doctor if:  You are afraid of falling at home.  You feel weak, drowsy, or dizzy at home.  You fall at home. Summary  There are many simple things that you can do to make your home safe and to help prevent falls.  Ways to make your home safe include removing tripping hazards and installing grab bars in the bathroom.  Ask for help when making these changes in your home. This information is not intended to replace advice given to you by your health care provider. Make sure you discuss any questions you have with your health care provider. Document Revised: 08/27/2018 Document Reviewed: 12/19/2016 Elsevier Patient Education  2020 [employer].

## 2019-12-28 NOTE — Progress Notes (Signed)
[facility]    Provider:  Dr Jaynee Eagles Requesting Provider: Jani Gravel, MD Primary Care Provider:  Jani Gravel, MD  CC:  Confusion and gait instability  HPI:  [name] is a 69 y.o. male here as requested urgently by Jani Gravel, MD for confusion and unsteady gait. PMHx hypertension, GERD, gastroparesis, esophageal cancer, diabetes, chronic kidney disease stage III, Barrett esophagus I reviewed Dr. Julianne Rice notes, confusion getting worse in the last few months, and steady gait for more than a year but getting worse, impaired hand eye coordination, voice getting weaker especially in the last 3 weeks, difficulty remembering how to find his way to places he wants to go, difficulty walking since February 2020, he has been to physical therapy, he has been falling at least several times over the prior few weeks, dropping things, knocking things over when he reaches for things, high-end eye coordination not as good as it was, trouble remembering names and trouble remembering words, forgetful and confused, he can member where he is going so he is not driving and he cannot remember how to use his cell phone does not always understand what his wife is telling him, he has to be told everything to do to one step at a time.  Also had hypertension and his amlodipine was held at last appointment.  I reviewed labs which included CBC which was unremarkable mild anemia, BMP showed a BUN of 29, creatinine 1.3, decreased GFR, otherwise unremarkable including normal LFTs, ferritin was 31, A1c 7.4, LDL 63, urinalysis did not show infection, all these labs were collected November 02, 2019.    Patient says he is frustrated, "I can't remember crap", wife provides most information, they recently were evicted and things worsened since then so a lot of stress in life. Balance issues ongoing 1-2 years at least and progressively worsening, he says he just all of a sudden falls, he may trip over things, sometimes he  just loses his balance and falls he misinterprets where certain places are in the room, he has difficulty with depth perception, He denies significant numbness in the legs or weakness sin the legs. He is walking slow, wife says he is shuffling. No shaking or tremors. Voice is becoming softer it is hard for him to project. No problem with smell. He is barely eating part may be depression lately, they are in a hotel right now. No active dreams, no significant snoring, he has a FHx of stroke but not dementia in the family. Whole body affected, he can;t sit up straight, when he walks he bends over. He has scoliosis but no new or significant low back or neck pain or radicular symptoms. They deny memory changes except in the last few weeks in the setting of stress he has forgotten how to use his cell phone for example like he is a child. Vision and hearing is fine. Most of the time he can make it to the bathroom.No other focal neurologic deficits, associated symptoms, inciting events or modifiable factors.  Reviewed notes, labs and imaging from outside physicians, which showed:  MRI brain 2009: reviewed images and agree with these findings:  Normal craniovertebral junction. Diffuse ventricular  dilatation. Dilatation of the lateral, third, and fourth  ventricles. The temporal horns are mildly dilated. Mild cerebral  atrophy. There are some atrophic changes involving the cerebellum,  mainly the vermis with enlargement of the folia however I feel that  the fourth ventricular dilatation is disproportionate.    Negative for mass,  hemorrhage, or acute/subacute infarction.  Patency of the vertebral, basilar, and internal carotid arteries.    IMPRESSION:  Diffuse ventricular dilatation. I cannot exclude normal pressure  hydrocephalus. See above comments.   Review of Systems: Patient complains of symptoms per HPI as well as the following symptoms: memory loss, stress, social problems (homeless).  Pertinent negatives and positives per HPI. All others negative.   Social History   Socioeconomic History  . Marital status: Married    Spouse name: Not on file  . Number of children: Not on file  . Years of education: Not on file  . Highest education level: Master's degree (e.g., MA, MS, MEng, MEd, MSW, MBA)  Occupational History  . Occupation: minister  Tobacco Use  . Smoking status: Never Smoker  . Smokeless tobacco: Never Used  Substance and Sexual Activity  . Alcohol use: Yes    Comment: occasionally drinks Bailey's  . Drug use: No  . Sexual activity: Yes  Other Topics Concern  . Not on file  Social History Narrative   Lives at home with wife   Right handed   Social Determinants of Health   Financial Resource Strain:   . Difficulty of Paying Living Expenses:   Food Insecurity:   . Worried About Charity fundraiser in the Last Year:   . Arboriculturist in the Last Year:   Transportation Needs:   . Film/video editor (Medical):   Marland Kitchen Lack of Transportation (Non-Medical):   Physical Activity:   . Days of Exercise per Week:   . Minutes of Exercise per Session:   Stress:   . Feeling of Stress :   Social Connections:   . Frequency of Communication with Friends and Family:   . Frequency of Social Gatherings with Friends and Family:   . Attends Religious Services:   . Active Member of Clubs or Organizations:   . Attends Archivist Meetings:   Marland Kitchen Marital Status:   Intimate Partner Violence:   . Fear of Current or Ex-Partner:   . Emotionally Abused:   Marland Kitchen Physically Abused:   . Sexually Abused:     Family History  Problem Relation Age of Onset  . Cancer Mother        colon, breast  . Heart disease Father   . Diabetes Other        FX HX  . Stroke Paternal Aunt   . Dementia Neg Hx   . Parkinson's disease Neg Hx     Past Medical History:  Diagnosis Date  . Barrett esophagus   . Chronic kidney disease (CKD), stage III (moderate)   . Diabetes  mellitus ([place])   . Esophageal cancer ([place])   . Gastroparesis   . GERD (gastroesophageal reflux disease)   . History of MRSA infection   . Hypertension     Patient Active Problem List   Diagnosis Date Noted  . Diabetes mellitus ([place])   . Hypertension   . Chronic kidney disease (CKD), stage III (moderate)     Past Surgical History:  Procedure Laterality Date  . Left VATS, Left thoracoabdominal esophagogastrectomy with jejunostomy and pyloroplasty     04/24/2004 Dr Arlyce Dice  . R cataract and retinal tears      Current Outpatient Medications  Medication Sig Dispense Refill  . amLODipine (NORVASC) 2.5 MG tablet Take 2.5 mg by mouth daily.    Marland Kitchen aspirin EC 81 MG tablet Take 81 mg by mouth daily. Swallow whole.    Marland Kitchen  clotrimazole-betamethasone (LOTRISONE) lotion Apply 1 application topically 2 (two) times daily as needed. To affected area.    . dapagliflozin propanediol (FARXIGA) 5 MG TABS tablet Take 5 mg by mouth daily.    . diphenoxylate-atropine (LOMOTIL) 2.5-0.025 MG tablet Take 2 tablets by mouth as needed.    Marland Kitchen esomeprazole (NEXIUM) 40 MG capsule Take 40 mg by mouth daily before breakfast.    . ferrous sulfate 325 (65 FE) MG tablet Take 325 mg by mouth daily.    . finasteride (PROSCAR) 5 MG tablet TAKE 1 TABLET BY MOUTH ONCE DAILY FOR PROSTATE  5  . glimepiride (AMARYL) 2 MG tablet TAKE 1 TABLET BY MOUTH ONCE DAILY WITH BREAKFAST OR THE FIRST MAIN MEAL OF THE DAY  2  . Iron-FA-B Cmp-C-Biot-Probiotic (FUSION PLUS PO) Take 130 mg by mouth daily.    . metFORMIN (GLUCOPHAGE) 1000 MG tablet Take 1,000 mg by mouth 2 (two) times daily.  5  . Multiple Vitamins-Minerals (CENTRUM SILVER PO) Take by mouth.    . ondansetron (ZOFRAN-ODT) 4 MG disintegrating tablet Take 4 mg by mouth every 8 (eight) hours as needed for nausea or vomiting.    . pravastatin (PRAVACHOL) 80 MG tablet TAKE 1 TABLET BY MOUTH ONCE DAILY FOR 90 DAYS  3  . quinapril-hydrochlorothiazide (ACCURETIC) 10-12.5 MG per tablet  Take 1 tablet by mouth daily.    . quinapril-hydrochlorothiazide (ACCURETIC) 20-25 MG tablet Take 0.5 tablets by mouth daily.    . saw palmetto 500 MG capsule Take 500 mg by mouth.    . solifenacin (VESICARE) 10 MG tablet Take 10 mg by mouth daily.    . tadalafil (CIALIS) 5 MG tablet Take 5 mg by mouth daily.    . Testosterone (AXIRON) 30 MG/ACT SOLN Place onto the skin.     No current facility-administered medications for this visit.    Allergies as of 12/28/2019 - Review Complete 12/28/2019  Allergen Reaction Noted  . Duragen [estradiol valerate]  09/08/2012  . Fentanyl Other (See Comments) 09/08/2012  . Morphine and related  09/08/2012    Vitals: BP 96/70 (BP Location: Right Arm, Patient Position: Sitting)   Pulse 90 Comment: irregular  Ht 6\' 5"  (1.956 m)   Wt 192 lb (87.1 kg)   SpO2 97%   BMI 22.77 kg/m  Last Weight:  Wt Readings from Last 1 Encounters:  12/28/19 192 lb (87.1 kg)   Last Height:   Ht Readings from Last 1 Encounters:  12/28/19 6\' 5"  (1.956 m)     Physical exam: Exam: Gen: NAD, conversant, well nourised, well groomed                     CV: RRR, no MRG. No Carotid Bruits. No peripheral edema, warm, nontender Eyes: Conjunctivae clear without exudates or hemorrhage  Neuro: Detailed Neurologic Exam  Speech:    Speech is normal; fluent and spontaneous with normal comprehension.  Cognition:  MMSE - Mini Mental State Exam 12/28/2019  Orientation to time 1  Orientation to Place 4  Registration 3  Attention/ Calculation 0  Recall 1  Language- name 2 objects 2  Language- repeat 1  Language- follow 3 step command 3  Language- read & follow direction 1  Write a sentence 1  Copy design 0  Total score 17       The patient is says it is September, 2000     recent and remote memory impaired ;     language fluent;  Impaired attention, concentration,  fund of knowledge  Cranial Nerves: masked facies    The pupils are equal, round, and reactive  to light. Attempted fundoscopy could not visualize due to small pupils. . Visual fields are full to finger confrontation. Impaired upgaze. Trigeminal sensation is intact and the muscles of mastication are normal. The face is symmetric. The palate elevates in the midline. Hearing intact. Voice is normal. Shoulder shrug is normal. The tongue has normal motion without fasciculations.   Coordination:    Normal finger to nose   Gait: Needs to use hands to get out of seat, decreased arm swing, stooped posture, en bloc turning, low clearance, magnetic/shuffle  Motor Observation:    No asymmetry, no atrophy, and no involuntary movements noted. Tone:    Increased tone upper extremities vs paratonia  Posture:    stooped    Strength: Mild LE prox weakness otherwise strength is V/V in the upper and lower limbs.      Sensation: intact to LT and pin prick distally, impaired proprioception and vibration at the great toes, Romberg negative.      Reflex Exam:  DTR's: Absent AJs, trace patellars, 1+ biceps   Toes:    The toes are equiv bilaterally.   Clonus:    Clonus is absent.    Assessment/Plan:  69 y.o. male here as requested urgently by Jani Gravel, MD for confusion and unsteady gait. PMHx hypertension, GERD, gastroparesis, esophageal cancer, diabetes, chronic kidney disease stage III, Barrett esophagus.  Patient has worsening gait, magnetic/shuffling gait, stooped posture, cognitive changes and MRI of the brain in 2009 with ventricular dilation suspicious for NPH.  We need to repeat MRI of the brain to follow possible NPH causing symptoms, will also check MRI of the cervical spine as well.  I do suspect probable NPH.  However if MRI and work-up negative we will probably need a DaTscan to evaluate for a parkinsonian syndrome.   Orders Placed This Encounter  Procedures  . MR BRAIN W WO CONTRAST  . MR CERVICAL SPINE WO CONTRAST     Cc: Jani Gravel, MD,  Jani Gravel, MD  Sarina Ill,  MD  [facility] [address] [place], [place] [address]  Phone [phone] Fax [phone]

## 2019-12-30 ENCOUNTER — Telehealth: Payer: Self-pay | Admitting: Neurology

## 2019-12-30 NOTE — Telephone Encounter (Signed)
[employer] pending uploaded notes  

## 2020-01-03 ENCOUNTER — Ambulatory Visit: Payer: Medicare PPO | Admitting: Podiatry

## 2020-01-05 NOTE — Telephone Encounter (Signed)
Mcarthur Rossetti Josem Kaufmann: [id number] (exp. 12/30/19 to 01/29/20) order sent to GI. They will reach out to the patient to schedule.

## 2020-01-19 ENCOUNTER — Other Ambulatory Visit: Payer: Self-pay

## 2020-01-19 ENCOUNTER — Emergency Department (HOSPITAL_COMMUNITY)
Admission: EM | Admit: 2020-01-19 | Discharge: 2020-01-19 | Disposition: A | Discharge status: Home / Self Care | Payer: Medicare PPO | Attending: Emergency Medicine | Admitting: Emergency Medicine

## 2020-01-19 DIAGNOSIS — R531 Weakness: Secondary | ICD-10-CM | POA: Diagnosis not present

## 2020-01-19 DIAGNOSIS — N183 Chronic kidney disease, stage 3 unspecified: Secondary | ICD-10-CM | POA: Diagnosis not present

## 2020-01-19 DIAGNOSIS — R000 Tachycardia, unspecified: Secondary | ICD-10-CM | POA: Diagnosis not present

## 2020-01-19 DIAGNOSIS — Z7982 Long term (current) use of aspirin: Secondary | ICD-10-CM | POA: Insufficient documentation

## 2020-01-19 DIAGNOSIS — I129 Hypertensive chronic kidney disease with stage 1 through stage 4 chronic kidney disease, or unspecified chronic kidney disease: Secondary | ICD-10-CM | POA: Diagnosis not present

## 2020-01-19 DIAGNOSIS — Z79899 Other long term (current) drug therapy: Secondary | ICD-10-CM | POA: Diagnosis not present

## 2020-01-19 DIAGNOSIS — R410 Disorientation, unspecified: Secondary | ICD-10-CM | POA: Insufficient documentation

## 2020-01-19 DIAGNOSIS — R638 Other symptoms and signs concerning food and fluid intake: Secondary | ICD-10-CM | POA: Diagnosis not present

## 2020-01-19 DIAGNOSIS — E1122 Type 2 diabetes mellitus with diabetic chronic kidney disease: Secondary | ICD-10-CM | POA: Diagnosis not present

## 2020-01-19 DIAGNOSIS — R4182 Altered mental status, unspecified: Secondary | ICD-10-CM | POA: Diagnosis present

## 2020-01-19 LAB — URINALYSIS, ROUTINE W REFLEX MICROSCOPIC
Bilirubin Urine: NEGATIVE
Glucose, UA: NEGATIVE mg/dL
Hgb urine dipstick: NEGATIVE
Ketones, ur: 20 mg/dL — AB
Leukocytes,Ua: NEGATIVE
Nitrite: NEGATIVE
Protein, ur: NEGATIVE mg/dL
Specific Gravity, Urine: 1.019 (ref 1.005–1.030)
pH: 5 (ref 5.0–8.0)

## 2020-01-19 LAB — CBC WITH DIFFERENTIAL/PLATELET
Abs Immature Granulocytes: 0.17 10*3/uL — ABNORMAL HIGH (ref 0.00–0.07)
Basophils Absolute: 0 10*3/uL (ref 0.0–0.1)
Basophils Relative: 0 %
Eosinophils Absolute: 0.1 10*3/uL (ref 0.0–0.5)
Eosinophils Relative: 1 %
HCT: 43.5 % (ref 39.0–52.0)
Hemoglobin: 14.1 g/dL (ref 13.0–17.0)
Immature Granulocytes: 2 %
Lymphocytes Relative: 15 %
Lymphs Abs: 1.4 10*3/uL (ref 0.7–4.0)
MCH: 29.6 pg (ref 26.0–34.0)
MCHC: 32.4 g/dL (ref 30.0–36.0)
MCV: 91.2 fL (ref 80.0–100.0)
Monocytes Absolute: 0.9 10*3/uL (ref 0.1–1.0)
Monocytes Relative: 10 %
Neutro Abs: 6.5 10*3/uL (ref 1.7–7.7)
Neutrophils Relative %: 72 %
Platelets: 245 10*3/uL (ref 150–400)
RBC: 4.77 MIL/uL (ref 4.22–5.81)
RDW: 13.6 % (ref 11.5–15.5)
WBC: 9.1 10*3/uL (ref 4.0–10.5)
nRBC: 0 % (ref 0.0–0.2)

## 2020-01-19 LAB — COMPREHENSIVE METABOLIC PANEL
ALT: 51 U/L — ABNORMAL HIGH (ref 0–44)
AST: 26 U/L (ref 15–41)
Albumin: 4.2 g/dL (ref 3.5–5.0)
Alkaline Phosphatase: 92 U/L (ref 38–126)
Anion gap: 13 (ref 5–15)
BUN: 39 mg/dL — ABNORMAL HIGH (ref 8–23)
CO2: 25 mmol/L (ref 22–32)
Calcium: 9.6 mg/dL (ref 8.9–10.3)
Chloride: 100 mmol/L (ref 98–111)
Creatinine, Ser: 1.56 mg/dL — ABNORMAL HIGH (ref 0.61–1.24)
GFR calc Af Amer: 51 mL/min — ABNORMAL LOW (ref >60)
GFR calc non Af Amer: 44 mL/min — ABNORMAL LOW (ref >60)
Glucose, Bld: 79 mg/dL (ref 70–99)
Potassium: 4.2 mmol/L (ref 3.5–5.1)
Sodium: 138 mmol/L (ref 135–145)
Total Bilirubin: 1.1 mg/dL (ref 0.3–1.2)
Total Protein: 7.4 g/dL (ref 6.5–8.1)

## 2020-01-19 MED ORDER — SODIUM CHLORIDE 0.9 % IV BOLUS
1000.0000 mL | Freq: Once | INTRAVENOUS | Status: AC
  Administered 2020-01-19: 1000 mL via INTRAVENOUS

## 2020-01-19 NOTE — ED Triage Notes (Signed)
Pt BIB EMS from home c/o AMS x 1 day. Recently dx with beginning stages of parkinson's. A&O x 3 (disorriented to time). Reports recent life changing events that may be a contribution to AMS. Wife called EMS.

## 2020-01-19 NOTE — ED Notes (Addendum)
Pt to be discharged to home. Call placed to wife for ride. Update given and discharge instructions reviewed with wife via phone. Pt sipping ginger ale.

## 2020-01-19 NOTE — ED Provider Notes (Signed)
[facility] DEPT Provider Note   CSN: [id number] Arrival date & time: 01/19/20  0248     History No chief complaint on file.   [name] is a 69 y.o. male.  HPI     This is a 69 year old male with a history of diabetes, Barrett's esophagus, gastroparesis, hypertension, and recent diagnosis of Parkinson's disease who brought in by EMS for altered mental status.  Patient is awake, alert, and able to provide history.  He states that he just has not been eating recently.  When asked why he has not been eating he states he is not hungry.  He denies any pain including chest pain, shortness of breath, abdominal pain, nausea, vomiting.  EMS reports that wife called with these concerns.  When asked if he had been more confused recently.  Patient reports that he has had worsening episodes of confusion recently.  I attempted to call the patient's wife but was unsuccessful.  Past Medical History:  Diagnosis Date  . Barrett esophagus   . Chronic kidney disease (CKD), stage III (moderate)   . Diabetes mellitus ([place])   . Esophageal cancer ([place])   . Gastroparesis   . GERD (gastroesophageal reflux disease)   . History of MRSA infection   . Hypertension     Patient Active Problem List   Diagnosis Date Noted  . Diabetes mellitus ([place])   . Hypertension   . Chronic kidney disease (CKD), stage III (moderate)     Past Surgical History:  Procedure Laterality Date  . Left VATS, Left thoracoabdominal esophagogastrectomy with jejunostomy and pyloroplasty     04/24/2004 Dr Arlyce Dice  . R cataract and retinal tears         Family History  Problem Relation Age of Onset  . Cancer Mother        colon, breast  . Heart disease Father   . Diabetes Other        FX HX  . Stroke Paternal Aunt   . Dementia Neg Hx   . Parkinson's disease Neg Hx     Social History   Tobacco Use  . Smoking status: Never Smoker  . Smokeless tobacco: Never Used  Substance Use  Topics  . Alcohol use: Yes    Comment: occasionally drinks Bailey's  . Drug use: No    Home Medications Prior to Admission medications   Medication Sig Start Date End Date Taking? Authorizing Provider  amLODipine (NORVASC) 2.5 MG tablet Take 2.5 mg by mouth daily.    [provider]  aspirin EC 81 MG tablet Take 81 mg by mouth daily. Swallow whole.    [provider]  clotrimazole-betamethasone (LOTRISONE) lotion Apply 1 application topically 2 (two) times daily as needed. To affected area.    [provider]  dapagliflozin propanediol (FARXIGA) 5 MG TABS tablet Take 5 mg by mouth daily.    [provider]  diphenoxylate-atropine (LOMOTIL) 2.5-0.025 MG tablet Take 2 tablets by mouth as needed.    [provider]  esomeprazole (NEXIUM) 40 MG capsule Take 40 mg by mouth daily before breakfast.    [provider]  ferrous sulfate 325 (65 FE) MG tablet Take 325 mg by mouth daily.    [provider]  finasteride (PROSCAR) 5 MG tablet TAKE 1 TABLET BY MOUTH ONCE DAILY FOR PROSTATE 01/15/18   [provider]  glimepiride (AMARYL) 2 MG tablet TAKE 1 TABLET BY MOUTH ONCE DAILY WITH BREAKFAST OR THE FIRST MAIN MEAL  OF THE DAY 02/16/18   [provider]  Iron-FA-B Cmp-C-Biot-Probiotic (FUSION PLUS PO) Take 130 mg by mouth daily.    [provider]  metFORMIN (GLUCOPHAGE) 1000 MG tablet Take 1,000 mg by mouth 2 (two) times daily. 04/17/18   [provider]  Multiple Vitamins-Minerals (CENTRUM SILVER PO) Take by mouth.    [provider]  ondansetron (ZOFRAN-ODT) 4 MG disintegrating tablet Take 4 mg by mouth every 8 (eight) hours as needed for nausea or vomiting.    [provider]  pravastatin (PRAVACHOL) 80 MG tablet TAKE 1 TABLET BY MOUTH ONCE DAILY FOR 90 DAYS 12/11/17   [provider]  quinapril-hydrochlorothiazide (ACCURETIC) 10-12.5 MG per tablet Take 1 tablet by mouth daily.     [provider]  quinapril-hydrochlorothiazide (ACCURETIC) 20-25 MG tablet Take 0.5 tablets by mouth daily.    [provider]  saw palmetto 500 MG capsule Take 500 mg by mouth.    [provider]  solifenacin (VESICARE) 10 MG tablet Take 10 mg by mouth daily.    [provider]  tadalafil (CIALIS) 5 MG tablet Take 5 mg by mouth daily.    [provider]  Testosterone (AXIRON) 30 MG/ACT SOLN Place onto the skin.    [provider]    Allergies    Duragen [estradiol valerate], Fentanyl, and Morphine and related  Review of Systems   Review of Systems  Constitutional: Positive for appetite change. Negative for fever.  Respiratory: Negative for shortness of breath.   Cardiovascular: Negative for chest pain.  Gastrointestinal: Negative for abdominal pain, nausea and vomiting.  All other systems reviewed and are negative.   Physical Exam Updated Vital Signs BP 131/74   Pulse 76   Temp 98.2 F (36.8 C)   Resp 13   Ht 1.93 m (6\' 4" )   Wt 90.7 kg   SpO2 100%   BMI 24.34 kg/m   Physical Exam Vitals and nursing note reviewed.  Constitutional:      Appearance: He is well-developed. He is not ill-appearing.  HENT:     Head: Normocephalic and atraumatic.     Nose: Nose normal.     Mouth/Throat:     Mouth: Mucous membranes are moist.  Eyes:     Pupils: Pupils are equal, round, and reactive to light.  Cardiovascular:     Rate and Rhythm: Normal rate and regular rhythm.     Heart sounds: Normal heart sounds. No murmur heard.   Pulmonary:     Effort: Pulmonary effort is normal. No respiratory distress.     Breath sounds: Normal breath sounds. No wheezing.  Abdominal:     General: Bowel sounds are normal.     Palpations: Abdomen is soft.     Tenderness: There is no abdominal tenderness. There is no rebound.  Musculoskeletal:     Cervical back: Neck supple.     Right lower leg: No edema.     Left lower leg: No edema.    Lymphadenopathy:     Cervical: No cervical adenopathy.  Skin:    General: Skin is warm and dry.  Neurological:     Mental Status: He is alert and oriented to person, place, and time.  Psychiatric:        Mood and Affect: Mood normal.     ED Results / Procedures / Treatments   Labs (all labs ordered are listed, but only abnormal results are displayed) Labs Reviewed  CBC WITH DIFFERENTIAL/PLATELET - Abnormal; Notable  for the following components:      Result Value   Abs Immature Granulocytes 0.17 (*)    All other components within normal limits  COMPREHENSIVE METABOLIC PANEL - Abnormal; Notable for the following components:   BUN 39 (*)    Creatinine, Ser 1.56 (*)    ALT 51 (*)    GFR calc non Af Amer 44 (*)    GFR calc Af Amer 51 (*)    All other components within normal limits  URINALYSIS, ROUTINE W REFLEX MICROSCOPIC - Abnormal; Notable for the following components:   Ketones, ur 20 (*)    All other components within normal limits    EKG EKG Interpretation  Date/Time:  Wednesday January 19 2020 03:11:41 EDT Ventricular Rate:  92 PR Interval:    QRS Duration: 102 QT Interval:  359 QTC Calculation: 445 R Axis:   -33 Text Interpretation: Sinus arrhythmia Left axis deviation Borderline T wave abnormalities Confirmed by Thayer Jew [phone]) on 01/19/2020 3:42:37 AM   Radiology No results found.  Procedures Procedures (including critical care time)  Medications Ordered in ED Medications  sodium chloride 0.9 % bolus 1,000 mL (1,000 mLs Intravenous Bolus from Bag 01/19/20 0507)    ED Course  I have reviewed the triage vital signs and the nursing notes.  Pertinent labs & imaging results that were available during my care of the patient were reviewed by me and considered in my medical decision making (see chart for details).  Clinical Course as of Jan 18 721  Wed Jan 19, 2020  0553 Was able to speak to the patient's wife.  She is concerned that he is not  "listening to her" and does not appear to be eating.  She states that at times he looks at her blankly and does not respond.  This has been ongoing for some time at this point.  She reports that he has been worked up as an outpatient for [employer] and has an MRI scheduled on Sunday.   [CH]    Clinical Course User Index [CH] Camber Ninh, Barbette Hair, MD   MDM Rules/Calculators/A&P                           Patient presents with reported confusion and decreased appetite.  He is able to provide me history.  He is mostly oriented and has no acute complaints.  Wife is concerned that he has become less responsive to her requests and is not eating well.  He has a very flat affect and is being worked up as an outpatient for [employer].  Many of his symptoms are consistent with possible Parkinson's disease.  I did obtain some basic lab work.  Creatinine slightly elevated which may reflect dehydration.  He was given fluids.  No evidence of urinary tract infection.  CBC is unremarkable.  EKG without acute ischemic changes or arrhythmia.  Patient reassured.  Discussed with wife appropriateness of ongoing outpatient follow-up.  After history, exam, and medical workup I feel the patient has been appropriately medically screened and is safe for discharge home. Pertinent diagnoses were discussed with the patient. Patient was given return precautions.   Final Clinical Impression(s) / ED Diagnoses Final diagnoses:  Confusion    Rx / DC Orders ED Discharge Orders    None       Irby Fails, Barbette Hair, MD 01/19/20 [phone]

## 2020-01-19 NOTE — Discharge Instructions (Signed)
Continue outpatient workup. Lab work and urinalysis is normal today.

## 2020-01-23 ENCOUNTER — Ambulatory Visit
Admission: RE | Admit: 2020-01-23 | Discharge: 2020-01-23 | Disposition: A | Discharge status: Home / Self Care | Payer: Medicare PPO | Source: Ambulatory Visit | Attending: Neurology | Admitting: Neurology

## 2020-01-23 ENCOUNTER — Other Ambulatory Visit: Payer: Self-pay

## 2020-01-23 DIAGNOSIS — R4189 Other symptoms and signs involving cognitive functions and awareness: Secondary | ICD-10-CM

## 2020-01-23 DIAGNOSIS — R2689 Other abnormalities of gait and mobility: Secondary | ICD-10-CM

## 2020-01-23 DIAGNOSIS — R269 Unspecified abnormalities of gait and mobility: Secondary | ICD-10-CM

## 2020-01-23 DIAGNOSIS — G20C Parkinsonism, unspecified: Secondary | ICD-10-CM

## 2020-01-23 DIAGNOSIS — W19XXXA Unspecified fall, initial encounter: Secondary | ICD-10-CM

## 2020-01-23 DIAGNOSIS — G20 Parkinson's disease: Secondary | ICD-10-CM

## 2020-01-23 DIAGNOSIS — M6289 Other specified disorders of muscle: Secondary | ICD-10-CM

## 2020-01-23 MED ORDER — GADOBENATE DIMEGLUMINE 529 MG/ML IV SOLN
18.0000 mL | Freq: Once | INTRAVENOUS | Status: AC | PRN
  Administered 2020-01-23: 18 mL via INTRAVENOUS

## 2020-01-25 ENCOUNTER — Telehealth: Payer: Self-pay | Admitting: *Deleted

## 2020-01-25 NOTE — Telephone Encounter (Signed)
-----   Message from Melvenia Beam, MD sent at 01/25/2020 11:35 AM EDT ----- I spoke to Dr. Zada Finders. We are going to do a high volume tap and see if he improves. If he does improve then we will send him to [facility]. I will put in the orders when I get into the office tomorrow just let them know the plan and the results (see prior result note as well). If they like I can see them in the office prior to doing anything so I can show them the images and explain the whole procedure and maybe even do a times walk test and an MMSe prior to LP.  thanks

## 2020-01-25 NOTE — Telephone Encounter (Signed)
I spoke with the pt's wife and we discussed the MRI brain showed ventriculomegaly, increased CSF and Dr Jaynee Eagles has spoken with a neurosurgeon. I let her know that Dr. Jaynee Eagles plans to do a high volume tap and see if pt improves. If he does we will send him to [facility]. The pt's wife verbalized understanding. She asked if we could take a picture of the images and email to her. When I told her we didn't have that capability she said she would just proceed with the procedure and will wait for a call to schedule. I let her know she could contact GI for a copy of the MRI CD if she wishes. She verbalized appreciation and understanding.

## 2020-01-30 ENCOUNTER — Other Ambulatory Visit: Payer: Self-pay | Admitting: Neurology

## 2020-01-30 DIAGNOSIS — G912 (Idiopathic) normal pressure hydrocephalus: Secondary | ICD-10-CM

## 2020-02-09 NOTE — Telephone Encounter (Signed)
I tried to call the wife twice on two different numbers in chart. I LVM on both lines asking for call back as soon as possible.

## 2020-02-09 NOTE — Telephone Encounter (Signed)
I called the pt's wife and discussed patient's status. She said he overall is worse, weaker than when he visited the ER on 9.1.21. She does not consider this an acute change but more gradual, "every day is a struggle". She states he is not taking care of himself. She has a hard time trying to get him to eat. He spends most of the days sleeping but does wake up. He may not answer her at times. She thinks he "shuts down" when he doesn't want to do anything. Pt's wife bought him a walker for his imbalance but he doesn't use it. He has been incontinent. She would take him to the ER but is concerned he would not be acute enough to be admitted. I spoke with Dr Jaynee Eagles who does agree at this time they do not need to rush to the ER but if he does develop acute change in status, difficult to arouse, he should go to the ER right away. Lumbar puncture is needed. ? How much improvement in condition this might bring. Pt's wife should also be encouraged to call primary care to rule out UTI or other infection that may be contributing to the decline and to see if they have social work there to help. Also consider looking into placement perhaps at memory facility due to his progressive chronic condition. I called the pt's wife back and discussed the above recommendations from Dr Jaynee Eagles with pt's wife who agreed and verbalized understanding. I also called GI to assist with LP scheduling. I LVM for Angel[facility] Ingles and also provided the wife with her number to call. I asked pt's wife to call us with the LP date so we can schedule pt to see Dr Jaynee Eagles afterward for evaluation. Pt's wife verbalized appreciation for the call.

## 2020-02-09 NOTE — Telephone Encounter (Signed)
Call documented in other mychart encounter from 02/09/2020.

## 2020-02-15 ENCOUNTER — Other Ambulatory Visit: Payer: Self-pay

## 2020-02-15 ENCOUNTER — Ambulatory Visit
Admission: RE | Admit: 2020-02-15 | Discharge: 2020-02-15 | Disposition: A | Discharge status: Home / Self Care | Payer: Medicare PPO | Source: Ambulatory Visit | Attending: Neurology | Admitting: Neurology

## 2020-02-15 DIAGNOSIS — G912 (Idiopathic) normal pressure hydrocephalus: Secondary | ICD-10-CM

## 2020-02-15 HISTORY — PX: LUMBAR PUNCTURE: SHX1985

## 2020-02-15 LAB — CSF CELL COUNT WITH DIFFERENTIAL
RBC Count, CSF: 0 cells/uL
WBC, CSF: 1 cells/uL (ref 0–5)

## 2020-02-15 LAB — PROTEIN, CSF: Total Protein, CSF: 41 mg/dL (ref 15–60)

## 2020-02-15 LAB — GLUCOSE, CSF: Glucose, CSF: 33 mg/dL — ABNORMAL LOW (ref 40–80)

## 2020-02-15 NOTE — Discharge Instructions (Signed)

## 2020-02-15 NOTE — Progress Notes (Signed)
[facility]    Provider:  Dr Jaynee Eagles Requesting Provider: Jani Gravel, MD Primary Care Provider:  Jani Gravel, MD  CC:  Confusion and gait instability  Interval history February 15, 2020: Patient is here today for evaluation after high-volume lumbar puncture for possible normal pressure hydrocephalus. Per wife Mobility is not better. Unclear if cognition is improved but she does not think, patient's wife thinks he "plays crazy" so not sure if he is ignoring her or not. It was "[facility]" getting him here, she told him to wash up and he was acting like he didn;t know how, or how to get dressed it was a battle and she thinks it is on purpose. (Wife playing video games throughout the appointment on her phone).He says he ignores it sometimes but he does not feel he had any improvement in cognition. Wife says he is "up and down" all night. Wife has "had it". He says he can be "obstinant" and not follow her instructions.    I did discuss with wife even if we treat the NPH he still may need additional care, she has been calling our office recently saying she cannot care for him anymore although nothing acute or subacutely new, patient is going to have to talk to family and primary care and possibly discuss with social work any help that she can get for patient.    HPI:  [name] is a 69 y.o. male here as requested urgently by Jani Gravel, MD for confusion and unsteady gait. PMHx hypertension, GERD, gastroparesis, esophageal cancer, diabetes, chronic kidney disease stage III, Barrett esophagus I reviewed Dr. Julianne Rice notes, confusion getting worse in the last few months, and steady gait for more than a year but getting worse, impaired hand eye coordination, voice getting weaker especially in the last 3 weeks, difficulty remembering how to find his way to places he wants to go, difficulty walking since February 2020, he has been to physical therapy, he has been falling at least several  times over the prior few weeks, dropping things, knocking things over when he reaches for things, high-end eye coordination not as good as it was, trouble remembering names and trouble remembering words, forgetful and confused, he can member where he is going so he is not driving and he cannot remember how to use his cell phone does not always understand what his wife is telling him, he has to be told everything to do to one step at a time.  Also had hypertension and his amlodipine was held at last appointment.  I reviewed labs which included CBC which was unremarkable mild anemia, BMP showed a BUN of 29, creatinine 1.3, decreased GFR, otherwise unremarkable including normal LFTs, ferritin was 31, A1c 7.4, LDL 63, urinalysis did not show infection, all these labs were collected November 02, 2019.    Patient says he is frustrated, "I can't remember crap", wife provides most information, they recently were evicted and things worsened since then so a lot of stress in life. Balance issues ongoing 1-2 years at least and progressively worsening, he says he just all of a sudden falls, he may trip over things, sometimes he just loses his balance and falls he misinterprets where certain places are in the room, he has difficulty with depth perception, He denies significant numbness in the legs or weakness sin the legs. He is walking slow, wife says he is shuffling. No shaking or tremors. Voice is becoming softer it is hard for him to project. No  problem with smell. He is barely eating part may be depression lately, they are in a hotel right now. No active dreams, no significant snoring, he has a FHx of stroke but not dementia in the family. Whole body affected, he can;t sit up straight, when he walks he bends over. He has scoliosis but no new or significant low back or neck pain or radicular symptoms. They deny memory changes except in the last few weeks in the setting of stress he has forgotten how to use his cell phone for  example like he is a child. Vision and hearing is fine. Most of the time he can make it to the bathroom.No other focal neurologic deficits, associated symptoms, inciting events or modifiable factors.  Reviewed notes, labs and imaging from outside physicians, which showed:  MRI brain 2009: reviewed images and agree with these findings:  Normal craniovertebral junction. Diffuse ventricular  dilatation. Dilatation of the lateral, third, and fourth  ventricles. The temporal horns are mildly dilated. Mild cerebral  atrophy. There are some atrophic changes involving the cerebellum,  mainly the vermis with enlargement of the folia however I feel that  the fourth ventricular dilatation is disproportionate.    Negative for mass, hemorrhage, or acute/subacute infarction.  Patency of the vertebral, basilar, and internal carotid arteries.    IMPRESSION:  Diffuse ventricular dilatation. I cannot exclude normal pressure  hydrocephalus. See above comments.   Review of Systems: Patient complains of symptoms per HPI as well as the following symptoms: memory loss, stress, social problems (homeless), falls, behavioral problems. Pertinent negatives and positives per HPI. All others negative   Social History   Socioeconomic History  . Marital status: Married    Spouse name: Not on file  . Number of children: Not on file  . Years of education: Not on file  . Highest education level: Master's degree (e.g., MA, MS, MEng, MEd, MSW, MBA)  Occupational History  . Occupation: minister  Tobacco Use  . Smoking status: Never Smoker  . Smokeless tobacco: Never Used  Substance and Sexual Activity  . Alcohol use: Yes    Comment: occasionally drinks Bailey's  . Drug use: No  . Sexual activity: Yes  Other Topics Concern  . Not on file  Social History Narrative   Lives at home with wife   Right handed   Social Determinants of Health   Financial Resource Strain:   . Difficulty of Paying Living  Expenses: Not on file  Food Insecurity:   . Worried About Charity fundraiser in the Last Year: Not on file  . Ran Out of Food in the Last Year: Not on file  Transportation Needs:   . Lack of Transportation (Medical): Not on file  . Lack of Transportation (Non-Medical): Not on file  Physical Activity:   . Days of Exercise per Week: Not on file  . Minutes of Exercise per Session: Not on file  Stress:   . Feeling of Stress : Not on file  Social Connections:   . Frequency of Communication with Friends and Family: Not on file  . Frequency of Social Gatherings with Friends and Family: Not on file  . Attends Religious Services: Not on file  . Active Member of Clubs or Organizations: Not on file  . Attends Archivist Meetings: Not on file  . Marital Status: Not on file  Intimate Partner Violence:   . Fear of Current or Ex-Partner: Not on file  . Emotionally Abused: Not on file  .  Physically Abused: Not on file  . Sexually Abused: Not on file    Family History  Problem Relation Age of Onset  . Cancer Mother        colon, breast  . Heart disease Father   . Diabetes Other        FX HX  . Stroke Paternal Aunt   . Dementia Neg Hx   . Parkinson's disease Neg Hx     Past Medical History:  Diagnosis Date  . Barrett esophagus   . Chronic kidney disease (CKD), stage III (moderate)   . Diabetes mellitus ([place])   . Esophageal cancer ([place])   . Gastroparesis   . GERD (gastroesophageal reflux disease)   . History of MRSA infection   . Hypertension     Patient Active Problem List   Diagnosis Date Noted  . Diabetes mellitus ([place])   . Hypertension   . Chronic kidney disease (CKD), stage III (moderate)     Past Surgical History:  Procedure Laterality Date  . Left VATS, Left thoracoabdominal esophagogastrectomy with jejunostomy and pyloroplasty     04/24/2004 Dr Arlyce Dice  . LUMBAR PUNCTURE  02/15/2020  . R cataract and retinal tears      Current Outpatient Medications   Medication Sig Dispense Refill  . amLODipine (NORVASC) 2.5 MG tablet Take 2.5 mg by mouth daily.    Marland Kitchen aspirin EC 81 MG tablet Take 81 mg by mouth daily. Swallow whole.    . clotrimazole-betamethasone (LOTRISONE) lotion Apply 1 application topically 2 (two) times daily as needed. To affected area.    . dapagliflozin propanediol (FARXIGA) 5 MG TABS tablet Take 5 mg by mouth daily.    . diphenoxylate-atropine (LOMOTIL) 2.5-0.025 MG tablet Take 2 tablets by mouth as needed.    Marland Kitchen esomeprazole (NEXIUM) 40 MG capsule Take 40 mg by mouth daily before breakfast.    . ferrous sulfate 325 (65 FE) MG tablet Take 325 mg by mouth daily.    . finasteride (PROSCAR) 5 MG tablet TAKE 1 TABLET BY MOUTH ONCE DAILY FOR PROSTATE  5  . glimepiride (AMARYL) 2 MG tablet TAKE 1 TABLET BY MOUTH ONCE DAILY WITH BREAKFAST OR THE FIRST MAIN MEAL OF THE DAY  2  . Iron-FA-B Cmp-C-Biot-Probiotic (FUSION PLUS PO) Take 130 mg by mouth daily.    . metFORMIN (GLUCOPHAGE) 1000 MG tablet Take 1,000 mg by mouth 2 (two) times daily.  5  . Multiple Vitamins-Minerals (CENTRUM SILVER PO) Take by mouth.    . ondansetron (ZOFRAN-ODT) 4 MG disintegrating tablet Take 4 mg by mouth every 8 (eight) hours as needed for nausea or vomiting.    . pravastatin (PRAVACHOL) 80 MG tablet TAKE 1 TABLET BY MOUTH ONCE DAILY FOR 90 DAYS  3  . quinapril-hydrochlorothiazide (ACCURETIC) 10-12.5 MG per tablet Take 1 tablet by mouth daily.    . quinapril-hydrochlorothiazide (ACCURETIC) 20-25 MG tablet Take 0.5 tablets by mouth daily.    . saw palmetto 500 MG capsule Take 500 mg by mouth.    . solifenacin (VESICARE) 10 MG tablet Take 10 mg by mouth daily.    . tadalafil (CIALIS) 5 MG tablet Take 5 mg by mouth daily.    . Testosterone (AXIRON) 30 MG/ACT SOLN Place onto the skin.     No current facility-administered medications for this visit.    Allergies as of 02/16/2020 - Review Complete 02/16/2020  Allergen Reaction Noted  . Duragen [estradiol valerate]   09/08/2012  . Fentanyl Other (See Comments) 09/08/2012  .  Morphine and related  09/08/2012    Vitals: BP 98/68 (BP Location: Left Arm, Patient Position: Sitting)   Pulse (!) 102   Ht 6\' 4"  (1.93 m)   Wt 174 lb (78.9 kg)   BMI 21.18 kg/m  Last Weight:  Wt Readings from Last 1 Encounters:  02/16/20 174 lb (78.9 kg)   Last Height:   Ht Readings from Last 1 Encounters:  02/16/20 6\' 4"  (1.93 m)     Physical exam: Exam: Gen: NAD, conversant, well nourised, well groomed                     CV: RRR, no MRG. No Carotid Bruits. No peripheral edema, warm, nontender Eyes: Conjunctivae clear without exudates or hemorrhage  Neuro: stable no changes Detailed Neurologic Exam  Speech:    Speech is normal; fluent and spontaneous with normal comprehension.  Cognition:  MMSE - Mini Mental State Exam 12/28/2019  Orientation to time 1  Orientation to Place 4  Registration 3  Attention/ Calculation 0  Recall 1  Language- name 2 objects 2  Language- repeat 1  Language- follow 3 step command 3  Language- read & follow direction 1  Write a sentence 1  Copy design 0  Total score 17       The patient is says it is September, 2000     recent and remote memory impaired ;     language fluent;     Impaired attention, concentration,  fund of knowledge  Cranial Nerves: masked facies    The pupils are equal, round, and reactive to light. Attempted fundoscopy could not visualize due to small pupils. . Visual fields are full to finger confrontation. Impaired upgaze. Trigeminal sensation is intact and the muscles of mastication are normal. The face is symmetric. The palate elevates in the midline. Hearing intact. Voice is normal. Shoulder shrug is normal. The tongue has normal motion without fasciculations.   Coordination:    Normal finger to nose   Gait: Needs to use hands to get out of seat, decreased arm swing, stooped posture, en bloc turning, low clearance, magnetic/shuffle. NO  CHANGES  Motor Observation:     no involuntary movements noted. Tone:    Increased tone upper extremities vs paratonia  Posture:    stooped    Strength: Mild LE prox weakness otherwise strength is V/V in the upper and lower limbs.      Sensation: intact to LT and pin prick distally, impaired proprioception and vibration at the great toes, Romberg negative.      Reflex Exam:  DTR's: Absent AJs, trace patellars, 1+ biceps   Toes:    The toes are equiv bilaterally.   Clonus:    Clonus is absent.    Assessment/Plan:  69 y.o. male here as requested urgently by Jani Gravel, MD for confusion and unsteady gait. PMHx hypertension, GERD, gastroparesis, esophageal cancer, diabetes, chronic kidney disease stage III, Barrett esophagus.  Patient has worsening gait, magnetic/shuffling gait, stooped posture, cognitive changes and MRI of the brain in 2009 with ventricular dilation suspicious for NPH.  Large volume tap did not improve symptoms.  MRI cervical spine did not show any additional etiology (personally reviewed imaging) for gait abnormality MRI of the brain showed moderately advance progressive atrophy, mild white-matter changes, enlarged ventricles however large volume tap showed no improvement. Personally reviewed imaging.  Will order Formal Neurocognitive/memory testing to diagnose extent to dementia DAT scan of the brain for parkinson's spectrum etiology Diagnosis or  dementia with behavioral disturbance bloodwork Discuss with pcp if medicaid may be possible and if they can help you with looking into this Reocommend assisted living or other living arrangements with help   Orders Placed This Encounter  Procedures  . NM BRAIN DATSCAN TUMOR LOC INFLAM SPECT 1 DAY  . B12 and Folate Panel  . Methylmalonic acid, serum  . TSH  . Vitamin B1  . Homocysteine  . Comprehensive metabolic panel  . CBC with Differential/Platelets  . Ambulatory referral to Neuropsychology     Cc: Jani Gravel, MD,  Jani Gravel, MD   I spent over 45 minutes of face-to-face and non-face-to-face time with patient on the  1. Dementia with behavioral disturbance, unspecified dementia type (HCC)   2. Parkinsonism, unspecified Parkinsonism type ([place])   3. Gait abnormality   4. Cognitive decline   5. Shuffling gait    diagnosis.  This included previsit chart review, lab review, study review, order entry, electronic health record documentation, patient education on the different diagnostic and therapeutic options, counseling and coordination of care, risks and benefits of management, compliance, or risk factor reduction   Sarina Ill, MD  [facility] [address] [place], [place] [address]  Phone [phone] Fax [phone]

## 2020-02-16 ENCOUNTER — Encounter: Payer: Self-pay | Admitting: Counselor

## 2020-02-16 ENCOUNTER — Encounter: Payer: Self-pay | Admitting: Neurology

## 2020-02-16 ENCOUNTER — Ambulatory Visit: Payer: Medicare PPO | Admitting: Neurology

## 2020-02-16 VITALS — BP 98/68 | HR 102 | Ht 76.0 in | Wt 174.0 lb

## 2020-02-16 DIAGNOSIS — G20 Parkinson's disease: Secondary | ICD-10-CM

## 2020-02-16 DIAGNOSIS — R4189 Other symptoms and signs involving cognitive functions and awareness: Secondary | ICD-10-CM | POA: Diagnosis not present

## 2020-02-16 DIAGNOSIS — R269 Unspecified abnormalities of gait and mobility: Secondary | ICD-10-CM | POA: Diagnosis not present

## 2020-02-16 DIAGNOSIS — R2689 Other abnormalities of gait and mobility: Secondary | ICD-10-CM | POA: Diagnosis not present

## 2020-02-16 DIAGNOSIS — F0391 Unspecified dementia with behavioral disturbance: Secondary | ICD-10-CM | POA: Diagnosis not present

## 2020-02-16 NOTE — Patient Instructions (Signed)
- Will order Formal Neurocognitive/memory testing to diagnose extent to dementia - Will order DAT scan of the brain for parkinson's spectrum etiology (see below for information and any medications to stop prior to testing) - Today's Diagnosis is dementia with behavioral disturbance - since the spinal tap did not improve anything, unlikely Normal Pressure Hydrocephalus - Discuss with primary care if medicaid may be possible and if they can help you with looking into this - Reocommend assisted living or other living arrangements with help  Brain DaTscan How to prepare and what to expect ?????????????????????????????????????What is a brain DaTscan? A brain DaTscan is a nuclear medicine scan. It uses radioactive material to diagnose some diseases of the brain, especially those that cause tremor (shakiness). DaTscan is a brand name for a drug called ioflupane I-123. A brain DaTscan is a form of radiology, because radiation is used to take pictures of the body. This radioactive drug is ordered especially for you. Because of this, we need at least 72 hours' notice if you must cancel or reschedule your scan.   How does the scan work? You will be given a small dose of tracer (radioactive material) through an intravenous (IV) line. This tracer will collect in part of your brain and give off gamma rays. A special camera called a gamma camera will use these rays to produce pictures and measurements of your brain. How do I prepare? Some drugs will affect the results of your brain DaTscan. You will need to stop taking these drugs before your scan. The table on page 2 lists the drugs that need to be stopped, and for how many days before your scan. This list is in alphabetical order by the generic name of the drug. The common brand names are listed beneath the generic name. Please confirm these instructions with your doctor who prescribed the drug. Drugs to Stop Taking Before your scan, stop taking these  medicines for the length of time shown: Name of Drug Stop Taking  Amoxapine 4 days before  Benztropine  Cogentin 3 days before  Bupropion (Aplenzin, Budeprion, Voxra, Wellbutrin, Zyban) 48 hours before  Buspirone 15 hours before  Citalopram 24 hours before  Cocaine 6 hours before  Escitalopram 24 hours before  Methamphetamine 24 hours before  Methylphenidate (Concerta, Metadate, Methylin, Ritalin) 20 hours before  Paroxetine 24 hours before  Selegilene 48 hours before  Sertraline 3 days before  If you are breastfeeding, or if there is any chance you are pregnant, please tell the scheduler or technologist (the person who will help you prepare for your scan). How is the scan done? When you first arrive, we will ask you to drink a small cup of water with potassium iodine in it. This water may have a metallic taste.  An hour after you drink the potassium iodine water, the technologist will inject a small amount of tracer into a vein in your arm or hand through your IV.  You must stay in the department for 30 minutes after the injection.  You will then have a break for 3 hours. It is OK to eat and drink during this break.  You must return to the clinic after this 3-hour break to have images of your brain taken.  Then, 4 hours after you receive your tracer injection, the technologist will take images of your brain with the gamma camera. You will lie flat on the exam table while these images are being taken.  You must not move while the camera is taking  pictures. If you move, the pictures will be blurry and may have to be taken again.  Taking the images will take 40 to 45 minutes. Your total time in the imaging room will be about 1 hour.  You may also have a low-dose CT scan of your brain to help confirm any results. A CT scan is another way to take images inside your body.  It will take about 5 hours from the time you drink the potassium iodine water until the scans are complete. What will I  feel during the scan? The technologist will help make you as comfortable as possible on the exam table for the scan.  You may feel some minor discomfort from the IV.  Lying still on the exam table may be hard for some patients.  The camera will be close to your head. This may make you feel confined or uneasy (claustrophobic). Please tell the doctor who referred you for this scan if you know you are claustrophobic. Are there any side effects from the scan? Most of the radioactivity from the tracer will pass out of your body in your urine or stool. The rest simply goes away over time.  Bad reactions to this scan are very rare. Fewer than 1% of patients (fewer than 1 out of 100) have a bad reaction. Reactions may include headache, nausea, vertigo (dizziness), or dry mouth. How do I get the results? When the test is over, the nuclear medicine doctor will review your images, prepare a written report, and talk with your doctor about the results. Your doctor will then talk with you about the results and your treatment options. If you needed to stop taking any medicines on the day of your scan, ask your doctor when to start taking them again.  The potentially interfering drugs consist of: amoxapine, amphetamine, benztropine, bupropion, buspirone, citalopram, cocaine, mazindol, methamphetamine, methylphenidate, norephedrine, phentermine, escitalopram, phenylpropanolamine, selegiline, paroxetine, and sertraline

## 2020-02-22 LAB — CBC WITH DIFFERENTIAL/PLATELET
Basophils Absolute: 0 10*3/uL (ref 0.0–0.2)
Basos: 1 %
EOS (ABSOLUTE): 0.1 10*3/uL (ref 0.0–0.4)
Eos: 1 %
Hematocrit: 43.4 % (ref 37.5–51.0)
Hemoglobin: 14.4 g/dL (ref 13.0–17.7)
Immature Grans (Abs): 0.1 10*3/uL (ref 0.0–0.1)
Immature Granulocytes: 1 %
Lymphocytes Absolute: 1.9 10*3/uL (ref 0.7–3.1)
Lymphs: 22 %
MCH: 29.8 pg (ref 26.6–33.0)
MCHC: 33.2 g/dL (ref 31.5–35.7)
MCV: 90 fL (ref 79–97)
Monocytes Absolute: 0.6 10*3/uL (ref 0.1–0.9)
Monocytes: 7 %
Neutrophils Absolute: 5.9 10*3/uL (ref 1.4–7.0)
Neutrophils: 68 %
Platelets: 275 10*3/uL (ref 150–450)
RBC: 4.83 x10E6/uL (ref 4.14–5.80)
RDW: 14.1 % (ref 11.6–15.4)
WBC: 8.6 10*3/uL (ref 3.4–10.8)

## 2020-02-22 LAB — COMPREHENSIVE METABOLIC PANEL
ALT: 79 IU/L — ABNORMAL HIGH (ref 0–44)
AST: 37 IU/L (ref 0–40)
Albumin/Globulin Ratio: 1.9 (ref 1.2–2.2)
Albumin: 4.4 g/dL (ref 3.7–4.7)
Alkaline Phosphatase: 93 IU/L (ref 44–121)
BUN/Creatinine Ratio: 27 — ABNORMAL HIGH (ref 10–24)
BUN: 45 mg/dL — ABNORMAL HIGH (ref 8–27)
Bilirubin Total: 1.2 mg/dL (ref 0.0–1.2)
CO2: 23 mmol/L (ref 20–29)
Calcium: 9.9 mg/dL (ref 8.6–10.2)
Chloride: 96 mmol/L (ref 96–106)
Creatinine, Ser: 1.66 mg/dL — ABNORMAL HIGH (ref 0.76–1.27)
GFR calc Af Amer: 47 mL/min/{1.73_m2} — ABNORMAL LOW (ref >59)
GFR calc non Af Amer: 41 mL/min/{1.73_m2} — ABNORMAL LOW (ref >59)
Globulin, Total: 2.3 g/dL (ref 1.5–4.5)
Glucose: 57 mg/dL — ABNORMAL LOW (ref 65–99)
Potassium: 4.5 mmol/L (ref 3.5–5.2)
Sodium: 137 mmol/L (ref 134–144)
Total Protein: 6.7 g/dL (ref 6.0–8.5)

## 2020-02-22 LAB — TSH: TSH: 1.29 u[IU]/mL (ref 0.450–4.500)

## 2020-02-22 LAB — B12 AND FOLATE PANEL
Folate: >20 ng/mL (ref >3.0)
Vitamin B-12: 1175 pg/mL (ref 232–1245)

## 2020-02-22 LAB — HOMOCYSTEINE: Homocysteine: 21.2 umol/L — ABNORMAL HIGH (ref 0.0–19.2)

## 2020-02-22 LAB — VITAMIN B1: Thiamine: 158.3 nmol/L (ref 66.5–200.0)

## 2020-02-22 LAB — METHYLMALONIC ACID, SERUM: Methylmalonic Acid: 166 nmol/L (ref 0–378)

## 2020-03-04 ENCOUNTER — Ambulatory Visit: Payer: Medicare PPO | Attending: Internal Medicine

## 2020-03-04 ENCOUNTER — Other Ambulatory Visit: Payer: Self-pay

## 2020-03-04 DIAGNOSIS — Z23 Encounter for immunization: Secondary | ICD-10-CM

## 2020-03-04 NOTE — Progress Notes (Signed)
   Covid-19 Vaccination Clinic  Name:  SUMMIT ARROYAVE    MRN: [id number] DOB: April 18, 1951  03/04/2020  Mr. Sangalang was observed post Covid-19 immunization for 15 minutes without incident. He was provided with Vaccine Information Sheet and instruction to access the V-Safe system.   Mr. Raczka was instructed to call 911 with any severe reactions post vaccine: Marland Kitchen Difficulty breathing  . Swelling of face and throat  . A fast heartbeat  . A bad rash all over body  . Dizziness and weakness

## 2020-03-16 ENCOUNTER — Encounter (HOSPITAL_COMMUNITY)
Admission: RE | Admit: 2020-03-16 | Discharge: 2020-03-16 | Disposition: A | Discharge status: Home / Self Care | Payer: Medicare PPO | Source: Ambulatory Visit | Attending: Neurology | Admitting: Neurology

## 2020-03-16 ENCOUNTER — Other Ambulatory Visit: Payer: Self-pay

## 2020-03-16 DIAGNOSIS — R269 Unspecified abnormalities of gait and mobility: Secondary | ICD-10-CM

## 2020-03-16 DIAGNOSIS — F0391 Unspecified dementia with behavioral disturbance: Secondary | ICD-10-CM

## 2020-03-16 DIAGNOSIS — R2689 Other abnormalities of gait and mobility: Secondary | ICD-10-CM | POA: Diagnosis not present

## 2020-03-16 DIAGNOSIS — R4189 Other symptoms and signs involving cognitive functions and awareness: Secondary | ICD-10-CM

## 2020-03-16 DIAGNOSIS — G20 Parkinson's disease: Secondary | ICD-10-CM | POA: Diagnosis not present

## 2020-03-16 DIAGNOSIS — G20C Parkinsonism, unspecified: Secondary | ICD-10-CM

## 2020-03-16 DIAGNOSIS — G3184 Mild cognitive impairment, so stated: Secondary | ICD-10-CM | POA: Diagnosis not present

## 2020-03-16 MED ORDER — POTASSIUM IODIDE (ANTIDOTE) 130 MG PO TABS: 130.0000 mg | ORAL_TABLET | Freq: Once | ORAL | Status: AC

## 2020-03-16 MED ORDER — POTASSIUM IODIDE (ANTIDOTE) 130 MG PO TABS
ORAL_TABLET | ORAL | Status: AC
  Administered 2020-03-16: 130 mg via ORAL
  Filled 2020-03-16: qty 1

## 2020-03-16 MED ORDER — IOFLUPANE I 123 185 MBQ/2.5ML IV SOLN
5.1000 | Freq: Once | INTRAVENOUS | Status: AC | PRN
  Administered 2020-03-16: 5.1 via INTRAVENOUS
  Filled 2020-03-16: qty 10

## 2020-03-17 ENCOUNTER — Ambulatory Visit (INDEPENDENT_AMBULATORY_CARE_PROVIDER_SITE_OTHER): Payer: Medicare PPO | Admitting: Counselor

## 2020-03-17 ENCOUNTER — Telehealth: Payer: Self-pay

## 2020-03-17 ENCOUNTER — Ambulatory Visit: Payer: Medicare PPO | Admitting: Psychology

## 2020-03-17 ENCOUNTER — Encounter: Payer: Self-pay | Admitting: Counselor

## 2020-03-17 DIAGNOSIS — G20 Parkinson's disease: Secondary | ICD-10-CM | POA: Diagnosis not present

## 2020-03-17 DIAGNOSIS — G9389 Other specified disorders of brain: Secondary | ICD-10-CM

## 2020-03-17 DIAGNOSIS — F09 Unspecified mental disorder due to known physiological condition: Secondary | ICD-10-CM | POA: Diagnosis not present
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
## 2020-03-17 NOTE — Telephone Encounter (Signed)
Called 2x w/o success. LM on the vm for a call back

## 2020-03-17 NOTE — Telephone Encounter (Signed)
-----   Message from Melvenia Beam, MD sent at 03/17/2020  8:45 AM EDT ----- The DAT scan shows that Mr. [name] may have Parkinsons disease. We can discuss further in the office at next appointment, please make sure to go to your neurocognitive testing that you have scheduled soon thanks thank you

## 2020-03-17 NOTE — Telephone Encounter (Signed)
Pt's wife called back and I notified her of the message below. Mrs. [name] is requesting a sooner appt. I explained after Dr. Jaynee Eagles reviews the other testing she will be contacted if a sooner appt is available.

## 2020-03-17 NOTE — Telephone Encounter (Signed)
Attempted to call the patient without success.  LM on the VM for the patient to call back re: recent results.  **If the patient calls back please connect the call to [place] or myself so we can relay the message below from Dr. Jaynee Eagles

## 2020-03-17 NOTE — Progress Notes (Signed)
   Psychometrist Note   Cognitive testing was administered to [employer] by Milana Kidney, B.S. (Technician) under the supervision of Alphonzo Severance, Psy.D., ABN. Mr. Hackler was able to tolerate all test procedures. Dr. Nicole Kindred met with the patient as needed to manage any emotional reactions to the testing procedures. Rest breaks were offered.    The battery of tests administered was selected by Dr. Nicole Kindred with consideration to the patient's current level of functioning, the nature of his symptoms, emotional and behavioral responses during the interview, level of literacy, observed level of motivation/effort, and the nature of the referral question. This battery was communicated to the psychometrist. Communication between Dr. Nicole Kindred and the psychometrist was ongoing throughout the evaluation and Dr. Nicole Kindred was immediately accessible at all times. Dr. Nicole Kindred provided supervision to the technician on the date of this service, to the extent necessary to assure the quality of all services provided.    Mr. Sahakian will return in approximately one week for an interactive feedback session with Dr. Nicole Kindred, at which time test performance, clinical impressions, and treatment recommendations will be reviewed in detail. The patient understands he can contact our office should he require our assistance before this time.   A total of 50 minutes of billable time were spent with Gerda Diss Litzenberger by the technician, including test administration and scoring time. Billing for these services is reflected in Dr. Les Pou note.   This note reflects time spent with the psychometrician and does not include test scores, clinical history, or any interpretations made by Dr. Nicole Kindred. The full report will follow in a separate note.

## 2020-03-17 NOTE — Telephone Encounter (Signed)
Pt wife has returned the call to [facility].  Please call her back.

## 2020-03-20 ENCOUNTER — Telehealth: Payer: Self-pay | Admitting: *Deleted

## 2020-03-20 NOTE — Telephone Encounter (Signed)
-----   Message from Melvenia Beam, MD sent at 03/17/2020  8:45 AM EDT ----- The DAT scan shows that [name] may have Parkinsons disease. We can discuss further in the office at next appointment, please make sure to go to your neurocognitive testing that you have scheduled soon thanks thank you

## 2020-03-20 NOTE — Telephone Encounter (Addendum)
I called and LMVM for pt to return call re: DAT results.  Keep 03-28-20 with neuropsych and then 05-01-20 with Dr. Jaynee Eagles.   Portia did reach with with results of DAT.  If she calls back I apologize for the same message.

## 2020-03-28 ENCOUNTER — Other Ambulatory Visit: Payer: Self-pay

## 2020-03-28 ENCOUNTER — Encounter: Payer: Medicare PPO | Admitting: Counselor

## 2020-03-28 ENCOUNTER — Encounter: Payer: Self-pay | Admitting: Counselor

## 2020-03-28 ENCOUNTER — Ambulatory Visit (INDEPENDENT_AMBULATORY_CARE_PROVIDER_SITE_OTHER): Payer: Medicare PPO | Admitting: Counselor

## 2020-03-28 DIAGNOSIS — F0390 Unspecified dementia without behavioral disturbance: Secondary | ICD-10-CM | POA: Diagnosis not present

## 2020-03-28 DIAGNOSIS — G9389 Other specified disorders of brain: Secondary | ICD-10-CM | POA: Diagnosis not present

## 2020-03-28 DIAGNOSIS — G20 Parkinson's disease: Secondary | ICD-10-CM | POA: Diagnosis not present

## 2020-03-28 NOTE — Progress Notes (Signed)
NEUROPSYCHOLOGY FEEDBACK NOTE [facility]  Feedback Note: I met with [name] to review the findings resulting from his neuropsychological evaluation. Since the last appointment, he has been about the same.Time was spent reviewing the impressions and recommendations that are detailed in the evaluation report. We discussed his rather complex medical situation, including the differential considerations and different treatment options that may be of benefit. I explained to him that a negative response to high volume LP does not rule out hydrocephalus as an etiology, although that is unlikely the only cause of his problems at this point with a positive DAT scan. He also does have a fair amount of atrophy inclusive of the cerebellum. His cognitive phenotype is difficult to get at given his impaired mentation at present, although I don't see any specific signs or symptoms that make me think of CBD, PD, MSA, or perhaps even PSP are considerations although PSP doesn't explain well his cerebellar atrophy. They will follow up with Dr. Jaynee Eagles. I took time to explain the findings and answer all the patient's questions. I encouraged [name] to contact me should he have any further questions or if further follow up is desired.   Current Medications and Medical History   Current Outpatient Medications  Medication Sig Dispense Refill  . amLODipine (NORVASC) 2.5 MG tablet Take 2.5 mg by mouth daily.    Marland Kitchen aspirin EC 81 MG tablet Take 81 mg by mouth daily. Swallow whole.    . clotrimazole-betamethasone (LOTRISONE) lotion Apply 1 application topically 2 (two) times daily as needed. To affected area.    . dapagliflozin propanediol (FARXIGA) 5 MG TABS tablet Take 5 mg by mouth daily.    . diphenoxylate-atropine (LOMOTIL) 2.5-0.025 MG tablet Take 2 tablets by mouth as needed.    Marland Kitchen esomeprazole (NEXIUM) 40 MG capsule Take 40 mg by mouth daily before breakfast.    . ferrous sulfate 325 (65 FE) MG  tablet Take 325 mg by mouth daily.    . finasteride (PROSCAR) 5 MG tablet TAKE 1 TABLET BY MOUTH ONCE DAILY FOR PROSTATE  5  . glimepiride (AMARYL) 2 MG tablet TAKE 1 TABLET BY MOUTH ONCE DAILY WITH BREAKFAST OR THE FIRST MAIN MEAL OF THE DAY  2  . Iron-FA-B Cmp-C-Biot-Probiotic (FUSION PLUS PO) Take 130 mg by mouth daily.    . metFORMIN (GLUCOPHAGE) 1000 MG tablet Take 1,000 mg by mouth 2 (two) times daily.  5  . Multiple Vitamins-Minerals (CENTRUM SILVER PO) Take by mouth.    . ondansetron (ZOFRAN-ODT) 4 MG disintegrating tablet Take 4 mg by mouth every 8 (eight) hours as needed for nausea or vomiting.    . pravastatin (PRAVACHOL) 80 MG tablet TAKE 1 TABLET BY MOUTH ONCE DAILY FOR 90 DAYS  3  . quinapril-hydrochlorothiazide (ACCURETIC) 10-12.5 MG per tablet Take 1 tablet by mouth daily.    . quinapril-hydrochlorothiazide (ACCURETIC) 20-25 MG tablet Take 0.5 tablets by mouth daily.    . saw palmetto 500 MG capsule Take 500 mg by mouth.    . solifenacin (VESICARE) 10 MG tablet Take 10 mg by mouth daily.    . tadalafil (CIALIS) 5 MG tablet Take 5 mg by mouth daily.    . Testosterone (AXIRON) 30 MG/ACT SOLN Place onto the skin.     No current facility-administered medications for this visit.    Patient Active Problem List   Diagnosis Date Noted  . Diabetes mellitus ([place])   . Hypertension   . Chronic kidney disease (CKD), stage  III (moderate) ([place])     Mental Status and Behavioral Observations  [name] presented on time to the present encounter and was alert and generally oriented. Speech was normal in rate, rhythm, volume, and prosody. Self-reported mood was "better" and affect was mainly neutral. Thought process was disjointed, he had a very hard time finishing his thoughst and thought content was appropriate to the topics discussed. There were no safety concerns identified at today's encounter, such as thoughts of harming self or others.   Plan  Feedback provided regarding the  patient's neuropsychological evaluation. His diagnostic status remains unclear, although I suspect that he has a component of NPH in addition to whatever else is going on given his rapid decline. They will follow up with Dr. Jaynee Eagles. [name] was encouraged to contact me if any questions arise or if further follow up is desired.   Viviano Simas Nicole Kindred, PsyD, ABN Clinical Neuropsychologist  Service(s) Provided at This Encounter: 35 minutes [phone]; Conjoint therapy with patient present)

## 2020-04-25 DIAGNOSIS — E118 Type 2 diabetes mellitus with unspecified complications: Secondary | ICD-10-CM | POA: Diagnosis not present

## 2020-04-25 DIAGNOSIS — R413 Other amnesia: Secondary | ICD-10-CM | POA: Diagnosis not present

## 2020-04-25 DIAGNOSIS — D509 Iron deficiency anemia, unspecified: Secondary | ICD-10-CM | POA: Diagnosis not present

## 2020-04-25 DIAGNOSIS — K219 Gastro-esophageal reflux disease without esophagitis: Secondary | ICD-10-CM | POA: Diagnosis not present

## 2020-04-25 DIAGNOSIS — Z23 Encounter for immunization: Secondary | ICD-10-CM | POA: Diagnosis not present

## 2020-05-01 ENCOUNTER — Other Ambulatory Visit: Payer: Self-pay

## 2020-05-01 ENCOUNTER — Ambulatory Visit: Payer: Medicare PPO | Admitting: Neurology

## 2020-05-01 ENCOUNTER — Encounter: Payer: Self-pay | Admitting: Neurology

## 2020-05-01 VITALS — BP 112/74 | HR 96 | Ht 76.0 in | Wt 167.0 lb

## 2020-05-01 DIAGNOSIS — G20 Parkinson's disease: Secondary | ICD-10-CM | POA: Diagnosis not present

## 2020-05-01 DIAGNOSIS — G912 (Idiopathic) normal pressure hydrocephalus: Secondary | ICD-10-CM | POA: Diagnosis not present

## 2020-05-01 NOTE — Progress Notes (Signed)
[facility]    Provider:  Dr Jaynee Eagles Requesting Provider: Jani Gravel, MD Primary Care Provider:  Jani Gravel, MD  CC:  Confusion and gait instability  Interval history 05/01/2020:MRI brain showed enlarged ventricles very suspicious for NPH but also moderately advanced atrophy. Formal memory testing showed possibly a component of NPH. Large volume tap did not show any patient improvement but given findings on formal memory testing may consider temporary lumbar drain. To complicate more, DAT scan showed possible parkinsonian syndrome which may be affecting his gait. Cannot rule out multiple etiologies. Will send to Neurosurgery for eval of a temporary lumbar drain.   Interval history February 15, 2020: Patient is here today for evaluation after high-volume lumbar puncture for possible normal pressure hydrocephalus. Per wife Mobility is not better. Unclear if cognition is improved but she does not think, patient's wife thinks he "plays crazy" so not sure if he is ignoring her or not. It was "[facility]" getting him here, she told him to wash up and he was acting like he didn;t know how, or how to get dressed it was a battle and she thinks it is on purpose. (Wife playing video games throughout the appointment on her phone).He says he ignores it sometimes but he does not feel he had any improvement in cognition. Wife says he is "up and down" all night. Wife has "had it". He says he can be "obstinant" and not follow her instructions.    I did discuss with wife even if we treat the NPH he still may need additional care, she has been calling our office recently saying she cannot care for him anymore although nothing acute or subacutely new, patient is going to have to talk to family and primary care and possibly discuss with social work any help that she can get for patient.    HPI:  [name] is a 69 y.o. male here as requested urgently by Jani Gravel, MD for confusion and unsteady  gait. PMHx hypertension, GERD, gastroparesis, esophageal cancer, diabetes, chronic kidney disease stage III, Barrett esophagus I reviewed Dr. Julianne Rice notes, confusion getting worse in the last few months, and steady gait for more than a year but getting worse, impaired hand eye coordination, voice getting weaker especially in the last 3 weeks, difficulty remembering how to find his way to places he wants to go, difficulty walking since February 2020, he has been to physical therapy, he has been falling at least several times over the prior few weeks, dropping things, knocking things over when he reaches for things, high-end eye coordination not as good as it was, trouble remembering names and trouble remembering words, forgetful and confused, he can member where he is going so he is not driving and he cannot remember how to use his cell phone does not always understand what his wife is telling him, he has to be told everything to do to one step at a time.  Also had hypertension and his amlodipine was held at last appointment.  I reviewed labs which included CBC which was unremarkable mild anemia, BMP showed a BUN of 29, creatinine 1.3, decreased GFR, otherwise unremarkable including normal LFTs, ferritin was 31, A1c 7.4, LDL 63, urinalysis did not show infection, all these labs were collected November 02, 2019.    Patient says he is frustrated, "I can't remember crap", wife provides most information, they recently were evicted and things worsened since then so a lot of stress in life. Balance issues ongoing 1-2 years  at least and progressively worsening, he says he just all of a sudden falls, he may trip over things, sometimes he just loses his balance and falls he misinterprets where certain places are in the room, he has difficulty with depth perception, He denies significant numbness in the legs or weakness sin the legs. He is walking slow, wife says he is shuffling. No shaking or tremors. Voice is becoming softer it  is hard for him to project. No problem with smell. He is barely eating part may be depression lately, they are in a hotel right now. No active dreams, no significant snoring, he has a FHx of stroke but not dementia in the family. Whole body affected, he can;t sit up straight, when he walks he bends over. He has scoliosis but no new or significant low back or neck pain or radicular symptoms. They deny memory changes except in the last few weeks in the setting of stress he has forgotten how to use his cell phone for example like he is a child. Vision and hearing is fine. Most of the time he can make it to the bathroom.No other focal neurologic deficits, associated symptoms, inciting events or modifiable factors.  Reviewed notes, labs and imaging from outside physicians, which showed:  MRI brain 2009: reviewed images and agree with these findings:  Normal craniovertebral junction. Diffuse ventricular  dilatation. Dilatation of the lateral, third, and fourth  ventricles. The temporal horns are mildly dilated. Mild cerebral  atrophy. There are some atrophic changes involving the cerebellum,  mainly the vermis with enlargement of the folia however I feel that  the fourth ventricular dilatation is disproportionate.    Negative for mass, hemorrhage, or acute/subacute infarction.  Patency of the vertebral, basilar, and internal carotid arteries.    IMPRESSION:  Diffuse ventricular dilatation. I cannot exclude normal pressure  hydrocephalus. See above comments.   Review of Systems: Patient complains of symptoms per HPI as well as the following symptoms: memory loss, stress, social problems (homeless), falls, behavioral problems. Pertinent negatives and positives per HPI. All others negative   Social History   Socioeconomic History  . Marital status: Married    Spouse name: Not on file  . Number of children: Not on file  . Years of education: Not on file  . Highest education level: Master's  degree (e.g., MA, MS, MEng, MEd, MSW, MBA)  Occupational History  . Occupation: minister  Tobacco Use  . Smoking status: Never Smoker  . Smokeless tobacco: Never Used  Substance and Sexual Activity  . Alcohol use: Yes    Comment: occasionally drinks Bailey's  . Drug use: No  . Sexual activity: Yes  Other Topics Concern  . Not on file  Social History Narrative   Lives at home with wife   Right handed   Social Determinants of Health   Financial Resource Strain: Not on file  Food Insecurity: Not on file  Transportation Needs: Not on file  Physical Activity: Not on file  Stress: Not on file  Social Connections: Not on file  Intimate Partner Violence: Not on file    Family History  Problem Relation Age of Onset  . Cancer Mother        colon, breast  . Heart disease Father   . Diabetes Other        FX HX  . Stroke Paternal Aunt   . Dementia Neg Hx   . Parkinson's disease Neg Hx     Past Medical History:  Diagnosis  Date  . Barrett esophagus   . Chronic kidney disease (CKD), stage III (moderate) ([facility])   . Diabetes mellitus ([place])   . Esophageal cancer ([place])   . Gastroparesis   . GERD (gastroesophageal reflux disease)   . History of MRSA infection   . Hypertension     Patient Active Problem List   Diagnosis Date Noted  . Diabetes mellitus ([place])   . Hypertension   . Chronic kidney disease (CKD), stage III (moderate) ([facility])     Past Surgical History:  Procedure Laterality Date  . Left VATS, Left thoracoabdominal esophagogastrectomy with jejunostomy and pyloroplasty     04/24/2004 Dr Arlyce Dice  . LUMBAR PUNCTURE  02/15/2020  . R cataract and retinal tears      Current Outpatient Medications  Medication Sig Dispense Refill  . amLODipine (NORVASC) 2.5 MG tablet Take 2.5 mg by mouth daily.    Marland Kitchen aspirin EC 81 MG tablet Take 81 mg by mouth daily. Swallow whole.    . clotrimazole-betamethasone (LOTRISONE) lotion Apply 1 application topically 2 (two) times daily as  needed. To affected area.    . dapagliflozin propanediol (FARXIGA) 5 MG TABS tablet Take 5 mg by mouth daily.    . diphenoxylate-atropine (LOMOTIL) 2.5-0.025 MG tablet Take 2 tablets by mouth as needed.    Marland Kitchen esomeprazole (NEXIUM) 40 MG capsule Take 40 mg by mouth daily before breakfast.    . ferrous sulfate 325 (65 FE) MG tablet Take 325 mg by mouth daily.    . finasteride (PROSCAR) 5 MG tablet TAKE 1 TABLET BY MOUTH ONCE DAILY FOR PROSTATE  5  . glimepiride (AMARYL) 2 MG tablet TAKE 1 TABLET BY MOUTH ONCE DAILY WITH BREAKFAST OR THE FIRST MAIN MEAL OF THE DAY  2  . Iron-FA-B Cmp-C-Biot-Probiotic (FUSION PLUS PO) Take 130 mg by mouth daily.    . metFORMIN (GLUCOPHAGE) 1000 MG tablet Take 1,000 mg by mouth 2 (two) times daily.  5  . Multiple Vitamins-Minerals (CENTRUM SILVER PO) Take by mouth.    . ondansetron (ZOFRAN-ODT) 4 MG disintegrating tablet Take 4 mg by mouth every 8 (eight) hours as needed for nausea or vomiting.    . pravastatin (PRAVACHOL) 80 MG tablet TAKE 1 TABLET BY MOUTH ONCE DAILY FOR 90 DAYS  3  . quinapril-hydrochlorothiazide (ACCURETIC) 10-12.5 MG per tablet Take 1 tablet by mouth daily.    . quinapril-hydrochlorothiazide (ACCURETIC) 20-25 MG tablet Take 0.5 tablets by mouth daily.    . saw palmetto 500 MG capsule Take 500 mg by mouth.    . solifenacin (VESICARE) 10 MG tablet Take 10 mg by mouth daily.    . tadalafil (CIALIS) 5 MG tablet Take 5 mg by mouth daily.    . Testosterone (AXIRON) 30 MG/ACT SOLN Place onto the skin.     No current facility-administered medications for this visit.    Allergies as of 05/01/2020 - Review Complete 02/16/2020  Allergen Reaction Noted  . Duragen [estradiol valerate]  09/08/2012  . Fentanyl Other (See Comments) 09/08/2012  . Morphine and related  09/08/2012    Vitals: There were no vitals taken for this visit. Last Weight:  Wt Readings from Last 1 Encounters:  02/16/20 174 lb (78.9 kg)   Last Height:   Ht Readings from Last 1  Encounters:  02/16/20 6\' 4"  (1.93 m)     Physical exam: Exam: Gen: NAD, conversant, well nourised, well groomed  CV: RRR, no MRG. No Carotid Bruits. No peripheral edema, warm, nontender Eyes: Conjunctivae clear without exudates or hemorrhage  Neuro: stable no changes Detailed Neurologic Exam  Speech:    Speech is normal; fluent and spontaneous with normal comprehension.  Cognition:  MMSE - Mini Mental State Exam 03/17/2020 02/16/2020 12/28/2019  Orientation to time 1 1 1   Orientation to Place 4 4 4   Registration 3 3 3   Attention/ Calculation 0 0 0  Recall 0 1 1  Language- name 2 objects 2 2 2   Language- repeat 1 1 1   Language- follow 3 step command 3 3 3   Language- read & follow direction 1 1 1   Write a sentence 1 0 1  Copy design 0 0 0  Total score 16 16 17        The patient is says it is September, 2000     recent and remote memory impaired ;     language fluent;     Impaired attention, concentration,  fund of knowledge  Cranial Nerves: masked facies    The pupils are equal, round, and reactive to light. Attempted fundoscopy could not visualize due to small pupils. . Visual fields are full to finger confrontation. Impaired upgaze. Trigeminal sensation is intact and the muscles of mastication are normal. The face is symmetric. The palate elevates in the midline. Hearing intact. Voice is normal. Shoulder shrug is normal. The tongue has normal motion without fasciculations.   Coordination:    Normal finger to nose   Gait: Needs to use hands to get out of seat, decreased arm swing, stooped posture, en bloc turning, low clearance, magnetic/shuffle. NO CHANGES  Motor Observation:     no involuntary movements noted. Tone:    Increased tone upper extremities vs paratonia  Posture:    stooped    Strength: Mild LE prox weakness otherwise strength is V/V in the upper and lower limbs.      Sensation: intact to LT and pin prick distally, impaired  proprioception and vibration at the great toes, Romberg negative.      Reflex Exam:  DTR's: Absent AJs, trace patellars, 1+ biceps   Toes:    The toes are equiv bilaterally.   Clonus:    Clonus is absent.    Assessment/Plan:  69 y.o. male here as requested urgently by Jani Gravel, MD for confusion and unsteady gait. PMHx hypertension, GERD, gastroparesis, esophageal cancer, diabetes, chronic kidney disease stage III, Barrett esophagus.  Patient has worsening gait, magnetic/shuffling gait, stooped posture, cognitive changes and MRI of the brain in 2009 with ventricular dilation suspicious for NPH.  Large volume tap did not improve symptoms.  - MRI brain showed enlarged ventricles but also moderately advanced atrophy. Formal memory testing showed possibly a component of NPH. Large volume tap did not show any patient improvement but given findings on formal memory testing may consider temporary lumbar drain or shunting. To complicate things more, DAT scan showed possible parkinsonian syndrome which may be affecting his gait. Cannot rule out multiple etiologies. Will send to Neurosurgery for eval of a temporary lumbar drain for NPH evaluation.    - Formal Neurocognitive/memory testing showed possibly a component of NPH. Large volume tap did not show any improvement however formal memory testing does show he may have a component of NPH. Possibly  he can have a temporary lumbar drain to see if this makes any difference will send to [facility] for opinion on trying a temporary lumbar drain. - DAT Scan showed:  Subtle asymmetric decreased radiotracer activity in the posterior LEFT striatum. Findings could indicate early Parkinsonian syndrome pathology. This may explain his gait vs NPH. Cannot rule out multiple etiologies. May consider [facility] ,ovement disorder team opinion of NPH lumbar drain not helpful.  - MRI cervical spine did not show any additional etiology (personally reviewed imaging)  for gait abnormality - MRI of the brain showed moderately advance progressive atrophy, mild white-matter changes, enlarged ventricles however large volume tap showed no improvement. Personally reviewed imaging.  - Discuss with pcp if medicaid may be possible and if they can help you with looking into this -Recommend assisted living or other living arrangements with help   No orders of the defined types were placed in this encounter.    Cc: Jani Gravel, MD,  Jani Gravel, MD   I spent over 45 minutes of face-to-face and non-face-to-face time with patient on the  No diagnosis found. diagnosis.  This included previsit chart review, lab review, study review, order entry, electronic health record documentation, patient education on the different diagnostic and therapeutic options, counseling and coordination of care, risks and benefits of management, compliance, or risk factor reduction   Sarina Ill, MD  [facility] [address] [place], [place] [address]  Phone [phone] Fax [phone]  I spent 40 minutes of face-to-face and non-face-to-face time with patient on the  1. NPH (normal pressure hydrocephalus) ([facility])    diagnosis.  This included previsit chart review, lab review, study review, order entry, electronic health record documentation, patient education on the different diagnostic and therapeutic options, counseling and coordination of care, risks and benefits of management, compliance, or risk factor reduction

## 2020-05-08 ENCOUNTER — Other Ambulatory Visit: Payer: Self-pay | Admitting: Neurology

## 2020-05-08 DIAGNOSIS — G912 (Idiopathic) normal pressure hydrocephalus: Secondary | ICD-10-CM

## 2020-05-25 DIAGNOSIS — G912 (Idiopathic) normal pressure hydrocephalus: Secondary | ICD-10-CM | POA: Diagnosis not present

## 2020-06-01 ENCOUNTER — Other Ambulatory Visit: Payer: Self-pay | Admitting: Neurological Surgery

## 2020-06-16 ENCOUNTER — Other Ambulatory Visit (HOSPITAL_COMMUNITY): Payer: Medicare PPO

## 2020-06-19 ENCOUNTER — Ambulatory Visit: Payer: Medicare PPO | Admitting: Adult Health

## 2020-06-24 ENCOUNTER — Other Ambulatory Visit: Payer: Self-pay

## 2020-06-24 ENCOUNTER — Encounter (HOSPITAL_COMMUNITY): Payer: Self-pay | Admitting: Internal Medicine

## 2020-06-24 ENCOUNTER — Emergency Department (HOSPITAL_COMMUNITY): Payer: Medicare PPO

## 2020-06-24 ENCOUNTER — Inpatient Hospital Stay (HOSPITAL_COMMUNITY)
Admission: EM | Admit: 2020-06-24 | Discharge: 2020-06-29 | DRG: 871 | Disposition: A | Discharge status: Skilled Nursing Facility | Payer: Medicare PPO | Attending: Internal Medicine | Admitting: Internal Medicine

## 2020-06-24 DIAGNOSIS — R000 Tachycardia, unspecified: Secondary | ICD-10-CM | POA: Diagnosis not present

## 2020-06-24 DIAGNOSIS — F0390 Unspecified dementia without behavioral disturbance: Secondary | ICD-10-CM | POA: Diagnosis present

## 2020-06-24 DIAGNOSIS — J189 Pneumonia, unspecified organism: Secondary | ICD-10-CM | POA: Diagnosis not present

## 2020-06-24 DIAGNOSIS — Z823 Family history of stroke: Secondary | ICD-10-CM | POA: Diagnosis not present

## 2020-06-24 DIAGNOSIS — J181 Lobar pneumonia, unspecified organism: Secondary | ICD-10-CM | POA: Diagnosis present

## 2020-06-24 DIAGNOSIS — I129 Hypertensive chronic kidney disease with stage 1 through stage 4 chronic kidney disease, or unspecified chronic kidney disease: Secondary | ICD-10-CM | POA: Diagnosis present

## 2020-06-24 DIAGNOSIS — N3000 Acute cystitis without hematuria: Secondary | ICD-10-CM | POA: Diagnosis not present

## 2020-06-24 DIAGNOSIS — E871 Hypo-osmolality and hyponatremia: Secondary | ICD-10-CM | POA: Diagnosis not present

## 2020-06-24 DIAGNOSIS — N1832 Chronic kidney disease, stage 3b: Secondary | ICD-10-CM | POA: Diagnosis present

## 2020-06-24 DIAGNOSIS — Z8614 Personal history of Methicillin resistant Staphylococcus aureus infection: Secondary | ICD-10-CM | POA: Diagnosis not present

## 2020-06-24 DIAGNOSIS — E440 Moderate protein-calorie malnutrition: Secondary | ICD-10-CM | POA: Diagnosis not present

## 2020-06-24 DIAGNOSIS — Z7984 Long term (current) use of oral hypoglycemic drugs: Secondary | ICD-10-CM | POA: Diagnosis not present

## 2020-06-24 DIAGNOSIS — Y92009 Unspecified place in unspecified non-institutional (private) residence as the place of occurrence of the external cause: Secondary | ICD-10-CM | POA: Diagnosis not present

## 2020-06-24 DIAGNOSIS — E1122 Type 2 diabetes mellitus with diabetic chronic kidney disease: Secondary | ICD-10-CM | POA: Diagnosis present

## 2020-06-24 DIAGNOSIS — Z8501 Personal history of malignant neoplasm of esophagus: Secondary | ICD-10-CM | POA: Diagnosis not present

## 2020-06-24 DIAGNOSIS — Z20822 Contact with and (suspected) exposure to covid-19: Secondary | ICD-10-CM | POA: Diagnosis present

## 2020-06-24 DIAGNOSIS — K219 Gastro-esophageal reflux disease without esophagitis: Secondary | ICD-10-CM | POA: Diagnosis not present

## 2020-06-24 DIAGNOSIS — A419 Sepsis, unspecified organism: Principal | ICD-10-CM | POA: Diagnosis present

## 2020-06-24 DIAGNOSIS — M6281 Muscle weakness (generalized): Secondary | ICD-10-CM | POA: Diagnosis not present

## 2020-06-24 DIAGNOSIS — D649 Anemia, unspecified: Secondary | ICD-10-CM | POA: Diagnosis present

## 2020-06-24 DIAGNOSIS — R531 Weakness: Secondary | ICD-10-CM | POA: Diagnosis not present

## 2020-06-24 DIAGNOSIS — J90 Pleural effusion, not elsewhere classified: Secondary | ICD-10-CM | POA: Diagnosis not present

## 2020-06-24 DIAGNOSIS — Z885 Allergy status to narcotic agent status: Secondary | ICD-10-CM

## 2020-06-24 DIAGNOSIS — Z888 Allergy status to other drugs, medicaments and biological substances status: Secondary | ICD-10-CM

## 2020-06-24 DIAGNOSIS — I10 Essential (primary) hypertension: Secondary | ICD-10-CM | POA: Diagnosis present

## 2020-06-24 DIAGNOSIS — R7989 Other specified abnormal findings of blood chemistry: Secondary | ICD-10-CM | POA: Diagnosis present

## 2020-06-24 DIAGNOSIS — Z79899 Other long term (current) drug therapy: Secondary | ICD-10-CM

## 2020-06-24 DIAGNOSIS — I213 ST elevation (STEMI) myocardial infarction of unspecified site: Secondary | ICD-10-CM | POA: Diagnosis not present

## 2020-06-24 DIAGNOSIS — N183 Chronic kidney disease, stage 3 unspecified: Secondary | ICD-10-CM | POA: Diagnosis not present

## 2020-06-24 DIAGNOSIS — E441 Mild protein-calorie malnutrition: Secondary | ICD-10-CM | POA: Diagnosis present

## 2020-06-24 DIAGNOSIS — N390 Urinary tract infection, site not specified: Secondary | ICD-10-CM | POA: Diagnosis not present

## 2020-06-24 DIAGNOSIS — Z800 Family history of malignant neoplasm of digestive organs: Secondary | ICD-10-CM | POA: Diagnosis not present

## 2020-06-24 DIAGNOSIS — R4189 Other symptoms and signs involving cognitive functions and awareness: Secondary | ICD-10-CM | POA: Diagnosis present

## 2020-06-24 DIAGNOSIS — E872 Acidosis: Secondary | ICD-10-CM | POA: Diagnosis present

## 2020-06-24 DIAGNOSIS — Z833 Family history of diabetes mellitus: Secondary | ICD-10-CM

## 2020-06-24 DIAGNOSIS — N3001 Acute cystitis with hematuria: Secondary | ICD-10-CM | POA: Diagnosis present

## 2020-06-24 DIAGNOSIS — D631 Anemia in chronic kidney disease: Secondary | ICD-10-CM | POA: Diagnosis present

## 2020-06-24 DIAGNOSIS — K2270 Barrett's esophagus without dysplasia: Secondary | ICD-10-CM | POA: Diagnosis present

## 2020-06-24 DIAGNOSIS — E869 Volume depletion, unspecified: Secondary | ICD-10-CM | POA: Diagnosis present

## 2020-06-24 DIAGNOSIS — Z743 Need for continuous supervision: Secondary | ICD-10-CM | POA: Diagnosis not present

## 2020-06-24 DIAGNOSIS — R5381 Other malaise: Secondary | ICD-10-CM | POA: Diagnosis not present

## 2020-06-24 DIAGNOSIS — E1165 Type 2 diabetes mellitus with hyperglycemia: Secondary | ICD-10-CM | POA: Diagnosis not present

## 2020-06-24 DIAGNOSIS — Z8249 Family history of ischemic heart disease and other diseases of the circulatory system: Secondary | ICD-10-CM

## 2020-06-24 DIAGNOSIS — Z6833 Body mass index (BMI) 33.0-33.9, adult: Secondary | ICD-10-CM

## 2020-06-24 DIAGNOSIS — E875 Hyperkalemia: Secondary | ICD-10-CM | POA: Diagnosis present

## 2020-06-24 DIAGNOSIS — R279 Unspecified lack of coordination: Secondary | ICD-10-CM | POA: Diagnosis not present

## 2020-06-24 DIAGNOSIS — W19XXXA Unspecified fall, initial encounter: Secondary | ICD-10-CM | POA: Diagnosis present

## 2020-06-24 DIAGNOSIS — E11649 Type 2 diabetes mellitus with hypoglycemia without coma: Secondary | ICD-10-CM | POA: Diagnosis not present

## 2020-06-24 DIAGNOSIS — R945 Abnormal results of liver function studies: Secondary | ICD-10-CM | POA: Diagnosis present

## 2020-06-24 HISTORY — DX: Parkinson's disease without dyskinesia, without mention of fluctuations: G20.A1

## 2020-06-24 HISTORY — DX: Sepsis, unspecified organism: N39.0

## 2020-06-24 HISTORY — DX: Parkinson's disease: G20

## 2020-06-24 HISTORY — DX: Unspecified dementia, unspecified severity, without behavioral disturbance, psychotic disturbance, mood disturbance, and anxiety: F03.90

## 2020-06-24 HISTORY — DX: Sepsis, unspecified organism: A41.9

## 2020-06-24 LAB — COMPREHENSIVE METABOLIC PANEL
ALT: 25 U/L (ref 0–44)
ALT: 13 U/L (ref 0–44)
AST: 76 U/L — ABNORMAL HIGH (ref 15–41)
AST: 26 U/L (ref 15–41)
Albumin: 3.1 g/dL — ABNORMAL LOW (ref 3.5–5.0)
Albumin: 1.9 g/dL — ABNORMAL LOW (ref 3.5–5.0)
Alkaline Phosphatase: 76 U/L (ref 38–126)
Alkaline Phosphatase: 48 U/L (ref 38–126)
Anion gap: 12 (ref 5–15)
Anion gap: 7 (ref 5–15)
BUN: 37 mg/dL — ABNORMAL HIGH (ref 8–23)
BUN: 24 mg/dL — ABNORMAL HIGH (ref 8–23)
CO2: 20 mmol/L — ABNORMAL LOW (ref 22–32)
CO2: 17 mmol/L — ABNORMAL LOW (ref 22–32)
Calcium: 8.1 mg/dL — ABNORMAL LOW (ref 8.9–10.3)
Calcium: 5.5 mg/dL — CL (ref 8.9–10.3)
Chloride: 98 mmol/L (ref 98–111)
Chloride: 116 mmol/L — ABNORMAL HIGH (ref 98–111)
Creatinine, Ser: 1.69 mg/dL — ABNORMAL HIGH (ref 0.61–1.24)
Creatinine, Ser: 0.86 mg/dL (ref 0.61–1.24)
GFR, Estimated: 43 mL/min — ABNORMAL LOW (ref >60)
GFR, Estimated: >60 mL/min (ref >60)
Glucose, Bld: 398 mg/dL — ABNORMAL HIGH (ref 70–99)
Glucose, Bld: 176 mg/dL — ABNORMAL HIGH (ref 70–99)
Potassium: 6 mmol/L — ABNORMAL HIGH (ref 3.5–5.1)
Potassium: 2.3 mmol/L — CL (ref 3.5–5.1)
Sodium: 130 mmol/L — ABNORMAL LOW (ref 135–145)
Sodium: 140 mmol/L (ref 135–145)
Total Bilirubin: 1.7 mg/dL — ABNORMAL HIGH (ref 0.3–1.2)
Total Bilirubin: 0.5 mg/dL (ref 0.3–1.2)
Total Protein: 5.9 g/dL — ABNORMAL LOW (ref 6.5–8.1)
Total Protein: 3.8 g/dL — ABNORMAL LOW (ref 6.5–8.1)

## 2020-06-24 LAB — CBC WITH DIFFERENTIAL/PLATELET
Abs Immature Granulocytes: 0.12 10*3/uL — ABNORMAL HIGH (ref 0.00–0.07)
Basophils Absolute: 0 10*3/uL (ref 0.0–0.1)
Basophils Relative: 0 %
Eosinophils Absolute: 0 10*3/uL (ref 0.0–0.5)
Eosinophils Relative: 0 %
HCT: 28.6 % — ABNORMAL LOW (ref 39.0–52.0)
Hemoglobin: 9.6 g/dL — ABNORMAL LOW (ref 13.0–17.0)
Immature Granulocytes: 1 %
Lymphocytes Relative: 1 %
Lymphs Abs: 0.1 10*3/uL — ABNORMAL LOW (ref 0.7–4.0)
MCH: 31.2 pg (ref 26.0–34.0)
MCHC: 33.6 g/dL (ref 30.0–36.0)
MCV: 92.9 fL (ref 80.0–100.0)
Monocytes Absolute: 0.5 10*3/uL (ref 0.1–1.0)
Monocytes Relative: 5 %
Neutro Abs: 9.7 10*3/uL — ABNORMAL HIGH (ref 1.7–7.7)
Neutrophils Relative %: 93 %
Platelets: 187 10*3/uL (ref 150–400)
RBC: 3.08 MIL/uL — ABNORMAL LOW (ref 4.22–5.81)
RDW: 12.8 % (ref 11.5–15.5)
WBC: 10.4 10*3/uL (ref 4.0–10.5)
nRBC: 0 % (ref 0.0–0.2)

## 2020-06-24 LAB — URINALYSIS, ROUTINE W REFLEX MICROSCOPIC
Bilirubin Urine: NEGATIVE
Glucose, UA: >=500 mg/dL — AB
Ketones, ur: NEGATIVE mg/dL
Nitrite: NEGATIVE
Protein, ur: NEGATIVE mg/dL
Specific Gravity, Urine: 1.016 (ref 1.005–1.030)
WBC, UA: >50 WBC/hpf — ABNORMAL HIGH (ref 0–5)
pH: 5 (ref 5.0–8.0)

## 2020-06-24 LAB — LACTIC ACID, PLASMA
Lactic Acid, Venous: 3.4 mmol/L (ref 0.5–1.9)
Lactic Acid, Venous: 1.8 mmol/L (ref 0.5–1.9)

## 2020-06-24 LAB — PROTIME-INR
INR: 1.2 (ref 0.8–1.2)
Prothrombin Time: 15.1 seconds (ref 11.4–15.2)

## 2020-06-24 LAB — SARS CORONAVIRUS 2 BY RT PCR (HOSPITAL ORDER, PERFORMED IN ~~LOC~~ HOSPITAL LAB): SARS Coronavirus 2: NEGATIVE

## 2020-06-24 LAB — CBG MONITORING, ED: Glucose-Capillary: 210 mg/dL — ABNORMAL HIGH (ref 70–99)

## 2020-06-24 LAB — APTT: aPTT: 27 seconds (ref 24–36)

## 2020-06-24 MED ORDER — AZITHROMYCIN 250 MG PO TABS
500.0000 mg | ORAL_TABLET | Freq: Once | ORAL | Status: AC
  Administered 2020-06-24: 500 mg via ORAL
  Filled 2020-06-24: qty 2

## 2020-06-24 MED ORDER — SODIUM ZIRCONIUM CYCLOSILICATE 10 G PO PACK
10.0000 g | PACK | Freq: Once | ORAL | Status: AC
  Administered 2020-06-24: 10 g via ORAL
  Filled 2020-06-24: qty 1

## 2020-06-24 MED ORDER — LACTATED RINGERS IV BOLUS (SEPSIS)
1000.0000 mL | Freq: Once | INTRAVENOUS | Status: AC
  Administered 2020-06-24: 1000 mL via INTRAVENOUS

## 2020-06-24 MED ORDER — ONDANSETRON HCL 4 MG PO TABS: 4.0000 mg | ORAL_TABLET | Freq: Four times a day (QID) | ORAL | Status: DC | PRN

## 2020-06-24 MED ORDER — IPRATROPIUM-ALBUTEROL 0.5-2.5 (3) MG/3ML IN SOLN: 3.0000 mL | Freq: Four times a day (QID) | RESPIRATORY_TRACT | Status: DC | PRN

## 2020-06-24 MED ORDER — SODIUM CHLORIDE 0.9 % IV SOLN: 500.0000 mg | INTRAVENOUS | Status: DC

## 2020-06-24 MED ORDER — ONDANSETRON HCL 4 MG/2ML IJ SOLN: 4.0000 mg | Freq: Four times a day (QID) | INTRAMUSCULAR | Status: DC | PRN

## 2020-06-24 MED ORDER — SODIUM CHLORIDE 0.9 % IV BOLUS
500.0000 mL | Freq: Once | INTRAVENOUS | Status: AC
  Administered 2020-06-24: 500 mL via INTRAVENOUS

## 2020-06-24 MED ORDER — ACETAMINOPHEN 325 MG PO TABS: 650.0000 mg | ORAL_TABLET | Freq: Four times a day (QID) | ORAL | Status: DC | PRN

## 2020-06-24 MED ORDER — SODIUM CHLORIDE 0.9 % IV BOLUS
1000.0000 mL | Freq: Once | INTRAVENOUS | Status: AC
  Administered 2020-06-24: 1000 mL via INTRAVENOUS

## 2020-06-24 MED ORDER — SODIUM CHLORIDE 0.9 % IV SOLN: 1.0000 g | Freq: Once | INTRAVENOUS | Status: DC

## 2020-06-24 MED ORDER — SODIUM CHLORIDE 0.9 % IV SOLN
2.0000 g | INTRAVENOUS | Status: AC
  Administered 2020-06-25 – 2020-06-28 (×4): 2 g via INTRAVENOUS
  Filled 2020-06-24 (×2): qty 2
  Filled 2020-06-24 (×3): qty 20
  Filled 2020-06-24: qty 2

## 2020-06-24 MED ORDER — IPRATROPIUM BROMIDE 0.02 % IN SOLN: 0.5000 mg | Freq: Four times a day (QID) | RESPIRATORY_TRACT | Status: DC | PRN

## 2020-06-24 MED ORDER — ENOXAPARIN SODIUM 40 MG/0.4ML ~~LOC~~ SOLN
40.0000 mg | SUBCUTANEOUS | Status: DC
  Administered 2020-06-24 – 2020-06-28 (×5): 40 mg via SUBCUTANEOUS
  Filled 2020-06-24 (×5): qty 0.4

## 2020-06-24 MED ORDER — ALBUTEROL SULFATE (2.5 MG/3ML) 0.083% IN NEBU: 2.5000 mg | INHALATION_SOLUTION | Freq: Four times a day (QID) | RESPIRATORY_TRACT | Status: DC | PRN

## 2020-06-24 MED ORDER — ACETAMINOPHEN 650 MG RE SUPP: 650.0000 mg | Freq: Four times a day (QID) | RECTAL | Status: DC | PRN

## 2020-06-24 MED ORDER — AZITHROMYCIN 250 MG PO TABS
250.0000 mg | ORAL_TABLET | Freq: Every day | ORAL | Status: AC
  Administered 2020-06-25 – 2020-06-28 (×4): 250 mg via ORAL
  Filled 2020-06-24 (×4): qty 1

## 2020-06-24 MED ORDER — SODIUM CHLORIDE 0.9 % IV SOLN
1.0000 g | INTRAVENOUS | Status: AC
  Administered 2020-06-24: 1 g via INTRAVENOUS
  Filled 2020-06-24: qty 10

## 2020-06-24 MED ORDER — PERMETHRIN 5 % EX CREA
TOPICAL_CREAM | Freq: Once | CUTANEOUS | Status: DC
  Filled 2020-06-24: qty 60

## 2020-06-24 MED ORDER — SODIUM CHLORIDE 0.9 % IV SOLN
1.0000 g | Freq: Once | INTRAVENOUS | Status: AC
  Administered 2020-06-24: 1 g via INTRAVENOUS
  Filled 2020-06-24: qty 10

## 2020-06-24 NOTE — ED Notes (Signed)
Date and time results received: 06/24/20 8:19 PM  Test: Lactic Acid Critical Value: 3.4  Name of Provider Notified: Darl Householder, EDP

## 2020-06-24 NOTE — ED Triage Notes (Signed)
Per EMS Pt wife states he weaker then normal. Has parkinson and early stage dementia. Per wife pt went down on floor. Has no coordination with legs. Stroke screen neg. UTI frequently. Slow to respond and this is normal. T 101.4   cbg 475 Hr 120 irr  1000mg  tylenol 1liter n/s 18 L fA B/p 130/80 R 26 Entitle 35

## 2020-06-24 NOTE — ED Provider Notes (Signed)
[facility] DEPT Provider Note   CSN: [id number] Arrival date & time: 06/24/20  1720     History Chief Complaint  Patient presents with  . Weakness    COSMO TETREAULT is a 70 y.o. male.  70 year old male brought in by EMS from home where he lives with his wife. Patient reports he is having trouble following directions, reports decrease in PO intake, denies CP, difficulty breathing, abdominal pain, vomiting, changes in bowel or bladder habits. Per nursing notes from EMS, found to have a temp of 101.4 and was given Tylenol by EMS. Per EMS Pt wife states he weaker then normal. Has parkinson and early stage dementia. Per wife pt went down on floor. Has no coordination with legs. Stroke screen neg. UTI frequently. Slow to respond and this is normal. T 101.4   cbg 475 Hr 120 irr  1000mg  tylenol 1liter n/s 18 L fA B/p 130/80 R 26 Entitle 35        Past Medical History:  Diagnosis Date  . Barrett esophagus   . Chronic kidney disease (CKD), stage III (moderate) (HCC)   . Diabetes mellitus ([place])   . Esophageal cancer ([place])   . Gastroparesis   . GERD (gastroesophageal reflux disease)   . History of MRSA infection   . Hypertension     Patient Active Problem List   Diagnosis Date Noted  . Diabetes mellitus ([place])   . Hypertension   . Chronic kidney disease (CKD), stage III (moderate) (HCC)     Past Surgical History:  Procedure Laterality Date  . Left VATS, Left thoracoabdominal esophagogastrectomy with jejunostomy and pyloroplasty     04/24/2004 Dr Arlyce Dice  . LUMBAR PUNCTURE  02/15/2020  . R cataract and retinal tears         Family History  Problem Relation Age of Onset  . Cancer Mother        colon, breast  . Heart disease Father   . Diabetes Other        FX HX  . Stroke Paternal Aunt   . Dementia Neg Hx   . Parkinson's disease Neg Hx     Social History   Tobacco Use  . Smoking status: Never Smoker  . Smokeless tobacco:  Never Used  Substance Use Topics  . Alcohol use: Yes    Comment: occasionally drinks Bailey's  . Drug use: No    Home Medications Prior to Admission medications   Medication Sig Start Date End Date Taking? Authorizing Provider  esomeprazole (NEXIUM) 40 MG capsule Take 40 mg by mouth daily before breakfast.    [provider]  glimepiride (AMARYL) 4 MG tablet Take 4 mg by mouth daily. 03/11/20   [provider]  Iron-FA-B Cmp-C-Biot-Probiotic (FUSION PLUS) CAPS Take 1 capsule by mouth daily. 06/01/20   [provider]  metFORMIN (GLUCOPHAGE) 1000 MG tablet Take 1,000 mg by mouth daily. 04/17/18   [provider]  Multiple Vitamin (MULTIVITAMIN WITH MINERALS) TABS tablet Take 1 tablet by mouth daily.    [provider]  quinapril-hydrochlorothiazide (ACCURETIC) 10-12.5 MG per tablet Take 1 tablet by mouth daily.    [provider]    Allergies    Duragen [estradiol valerate], Fentanyl, and Morphine and related  Review of Systems   Review of Systems  Unable to perform ROS: Dementia  Constitutional: Positive for fever.  Respiratory: Negative for shortness of breath.   Cardiovascular: Negative for chest pain.  Gastrointestinal: Negative for abdominal  pain, constipation, diarrhea and vomiting.  Genitourinary: Negative for dysuria and frequency.  Musculoskeletal: Negative for arthralgias and myalgias.  Skin: Negative for wound.  Neurological: Positive for weakness.    Physical Exam Updated Vital Signs BP 137/83   Pulse 88   Temp (!) 100.4 F (38 C) (Rectal)   Resp 18   Ht 6\' 5"  (1.956 m)   SpO2 100%   BMI 19.80 kg/m   Physical Exam Vitals and nursing note reviewed.  Constitutional:      General: He is not in acute distress.    Appearance: He is well-developed and well-nourished. He is not diaphoretic.  HENT:     Head: Normocephalic and atraumatic.     Mouth/Throat:     Mouth: Mucous membranes are dry.  Eyes:      Extraocular Movements: Extraocular movements intact.     Pupils: Pupils are equal, round, and reactive to light.  Cardiovascular:     Rate and Rhythm: Regular rhythm. Tachycardia present.     Heart sounds: No murmur heard.   Pulmonary:     Effort: Pulmonary effort is normal.     Breath sounds: Normal breath sounds.  Abdominal:     Palpations: Abdomen is soft.     Tenderness: There is no abdominal tenderness.  Musculoskeletal:     Right lower leg: No edema.     Left lower leg: No edema.  Skin:    General: Skin is warm and dry.     Findings: No erythema or rash.  Neurological:     General: No focal deficit present.     Mental Status: He is alert.     Cranial Nerves: No cranial nerve deficit.     Sensory: No sensory deficit.     Comments: Lower extremities generally weak/no asymmetric weakness.   Psychiatric:        Mood and Affect: Mood and affect normal.        Behavior: Behavior normal.     ED Results / Procedures / Treatments   Labs (all labs ordered are listed, but only abnormal results are displayed) Labs Reviewed  LACTIC ACID, PLASMA - Abnormal; Notable for the following components:      Result Value   Lactic Acid, Venous 3.4 (*)    All other components within normal limits  COMPREHENSIVE METABOLIC PANEL - Abnormal; Notable for the following components:   Sodium 130 (*)    Potassium 6.0 (*)    CO2 20 (*)    Glucose, Bld 398 (*)    BUN 37 (*)    Creatinine, Ser 1.69 (*)    Calcium 8.1 (*)    Total Protein 5.9 (*)    Albumin 3.1 (*)    AST 76 (*)    Total Bilirubin 1.7 (*)    GFR, Estimated 43 (*)    All other components within normal limits  CBC WITH DIFFERENTIAL/PLATELET - Abnormal; Notable for the following components:   RBC 3.08 (*)    Hemoglobin 9.6 (*)    HCT 28.6 (*)    Neutro Abs 9.7 (*)    Lymphs Abs 0.1 (*)    Abs Immature Granulocytes 0.12 (*)    All other components within normal limits  URINALYSIS, ROUTINE W REFLEX MICROSCOPIC - Abnormal;  Notable for the following components:   Glucose, UA >=500 (*)    Hgb urine dipstick LARGE (*)    Leukocytes,Ua MODERATE (*)    WBC, UA >50 (*)    Bacteria, UA RARE (*)  All other components within normal limits  SARS CORONAVIRUS 2 BY RT PCR (HOSPITAL ORDER, [facility])  URINE CULTURE  CULTURE, BLOOD (ROUTINE X 2)  CULTURE, BLOOD (ROUTINE X 2)  LACTIC ACID, PLASMA  PROTIME-INR  APTT  BASIC METABOLIC PANEL  POC OCCULT BLOOD, ED    EKG EKG Interpretation  Date/Time:  Saturday June 24 2020 18:18:44 EST Ventricular Rate:  102 PR Interval:    QRS Duration: 99 QT Interval:  360 QTC Calculation: 469 R Axis:   -25 Text Interpretation: Sinus tachycardia Atrial premature complex Borderline left axis deviation Consider anterior infarct No significant change since last tracing Confirmed by Wandra Arthurs [phone]) on 06/24/2020 6:22:42 PM   Radiology DG Chest Port 1 View  Result Date: 06/24/2020 CLINICAL DATA:  Questionable sepsis EXAM: PORTABLE CHEST 1 VIEW COMPARISON:  06/03/2007 FINDINGS: Heart is normal size. Possible small left effusion. Patchy opacities in the left lower lung. Right lung clear. No acute bony abnormality. IMPRESSION: Suspect small left effusion with patchy opacities at the left lung base. This could reflect pneumonia. Electronically Signed   By: Rolm Baptise M.D.   On: 06/24/2020 19:14    Procedures .Critical Care Performed by: Tacy Learn, PA-C Authorized by: Tacy Learn, PA-C   Critical care provider statement:    Critical care time (minutes):  45   Critical care was time spent personally by me on the following activities:  Discussions with consultants, evaluation of patient's response to treatment, examination of patient, ordering and performing treatments and interventions, ordering and review of laboratory studies, ordering and review of radiographic studies, pulse oximetry, re-evaluation of patient's condition, obtaining  history from patient or surrogate and review of old charts     Medications Ordered in ED Medications  sodium zirconium cyclosilicate (LOKELMA) packet 10 g (has no administration in time range)  sodium chloride 0.9 % bolus 500 mL (0 mLs Intravenous Stopped 06/24/20 2009)  cefTRIAXone (ROCEPHIN) 1 g in sodium chloride 0.9 % 100 mL IVPB (1 g Intravenous New Bag/Given 06/24/20 2016)  azithromycin (ZITHROMAX) tablet 500 mg (500 mg Oral Given 06/24/20 2012)  sodium chloride 0.9 % bolus 500 mL (500 mLs Intravenous New Bag/Given 06/24/20 2113)  sodium chloride 0.9 % bolus 1,000 mL (1,000 mLs Intravenous New Bag/Given 06/24/20 2113)    ED Course  I have reviewed the triage vital signs and the nursing notes.  Pertinent labs & imaging results that were available during my care of the patient were reviewed by me and considered in my medical decision making (see chart for details).  Clinical Course as of 06/24/20 2128  Sat Jun 24, 7149  1871 70 year old male presents from home, concern for weakness (generalized). Found to be febrile, tachycardic, patient is a poor historian.  Code sepsis initiated for fever and tachycardia, source unknown.  CXR with possible LLL PNA, given Rocephin and Zithromax. CBC with normal WBC, hgb 9.6, previously 14, hemoccult requested). CMP with hyperkalemia at 6.0 (repeat BMP requested to verify). Cr at baseline.  Lactic acid elevated at 3.4, patient is receiving IV fluids, will trend.  COVID negative.  [LM]  2043 Repeat lactic acid now 1.8. Case discussed with Dr. Olevia Bowens with hospitalist service who will consult for admission.  [LM]    Clinical Course User Index [LM] Roque Lias   MDM Rules/Calculators/A&P  Final Clinical Impression(s) / ED Diagnoses Final diagnoses:  Sepsis, due to unspecified organism, unspecified whether acute organ dysfunction present [facility])  Community acquired pneumonia, unspecified laterality  Acute cystitis with  hematuria    Rx / DC Orders ED Discharge Orders    None       Roque Lias 06/24/20 2128    Drenda Freeze, MD 06/24/20 2231

## 2020-06-24 NOTE — ED Notes (Signed)
Date and time results received: 06/24/20 11:09 PM  Test: Calcium Critical Value: 5.5  Test: Potassium  Critical Value: 2.3  Name of Provider Notified: Olevia Bowens, MD

## 2020-06-24 NOTE — ED Notes (Signed)
Bed alarm placed and activated for pt's safety.

## 2020-06-24 NOTE — H&P (Addendum)
History and Physical    [name] DOB: 1951-01-13 DOA: 06/24/2020  PCP: Jani Gravel, MD   Patient coming from: Home.   I have personally briefly reviewed patient's old medical records in [place]  Chief Complaint: Weakness.  HPI: [name] is a 70 y.o. male with medical history significant of Barrett's esophagus, esophageal cancer, GERD, gastroparesis, type II DM, stage III CKD, hypertension, history of MRSA infection who is coming to the emergency department via EMS after the patient has been having worsening weakness for the past 2 to 3 days associated with decreasing oral intake.  The patient's wife stated that the patient went down on the floor and was unable to assess with transfers due to lower extremity weakness.  EMS found that the patient was febrile with a temperature 101.4 F and they gave the patient 1000 mg of acetaminophen and a liter of normal saline in route to the emergency department.  The patient has had a similar episodes when he gets a urine tract infection.  He is oriented to name, he knows he is in the hospital, but think he is at most is calm.  He is disoriented to time, date and partially to situation.  He is able to answer simple questions denies headache, chest pain, flank or abdominal pain.  ED Course: Initial vital signs were temperature 99.1 F, pulse 110, respirations 18, BP 114/66 mmHg and O2 sat 99% on room air.  The patient's temperature subsequently increased to 100.4 F.  He was given 2000 mL of NS bolus, ceftriaxone 1 g IVPB x1 and azithromycin 500 mg p.o. x1 dose.  I added LR 1000 mL IV bolus and Lokelma 10 g p.o. x1.  Labwork: Urinalysis showed glucosuria more than 500 mg/dL with large hemoglobinuria moderate leukocyte esterase.  RBC were 6-10 WBC were more than 50 per hpf and there were rare bacteria.  Lactic acid was 3.4 and then one-point a mmol/L.  CBC showed a white count of 10.4 with 93% neutrophils, hemoglobin 9.6 g/dL  and platelets 187.  Normal PT/INR/PTT.  SARS coronavirus PCR was negative.  Fecal occult blood negative.  Sodium is 130, potassium 6.0, chloride 98 and CO2 20 mmol/L.  Glucose 398, BUN 37, creatinine 1.69 and calcium 8.1 mg/dL (normalizes when corrected to albumin).  Total protein is 5.9 and albumin 3.1 g/dL.  AST was 76 units/L.  Total bilirubin was 1.7 mg/dL.  ALT and alkaline phosphatase were normal.  Addendum to labs: CMP showed a potassium level of 2.3 mmol/, calcium of 5.9 mg/dL, different values for renal function and LFTs.  I-STAT Chem-8 and BMP showed that these results were inaccurate.  Imaging: A one-view portable chest radiograph show small left effusion with patchy opacities at the left lung base suspicious for pneumonia.  Please see image and full radiology report for further detail.  Review of Systems: As per HPI otherwise all other systems reviewed and are negative.  Past Medical History:  Diagnosis Date  . Barrett esophagus   . Chronic kidney disease (CKD), stage III (moderate) (HCC)   . Diabetes mellitus ([place])   . Esophageal cancer ([place])   . Gastroparesis   . GERD (gastroesophageal reflux disease)   . History of MRSA infection   . Hypertension     Past Surgical History:  Procedure Laterality Date  . Left VATS, Left thoracoabdominal esophagogastrectomy with jejunostomy and pyloroplasty     04/24/2004 Dr Arlyce Dice  . LUMBAR PUNCTURE  02/15/2020  . R cataract  and retinal tears      Social History  reports that he has never smoked. He has never used smokeless tobacco. He reports current alcohol use. He reports that he does not use drugs.  Allergies  Allergen Reactions  . Duragen [Estradiol Valerate] Other (See Comments)    unknown  . Fentanyl Other (See Comments)    hallucinations  . Morphine And Related Other (See Comments)    Hallucinations.    Family History  Problem Relation Age of Onset  . Cancer Mother        colon, breast  . Heart disease Father   .  Diabetes Other        FX HX  . Stroke Paternal Aunt   . Dementia Neg Hx   . Parkinson's disease Neg Hx    Prior to Admission medications   Medication Sig Start Date End Date Taking? Authorizing Provider  esomeprazole (NEXIUM) 40 MG capsule Take 40 mg by mouth daily before breakfast.    [provider]  glimepiride (AMARYL) 4 MG tablet Take 4 mg by mouth daily. 03/11/20   [provider]  Iron-FA-B Cmp-C-Biot-Probiotic (FUSION PLUS) CAPS Take 1 capsule by mouth daily. 06/01/20   [provider]  metFORMIN (GLUCOPHAGE) 1000 MG tablet Take 1,000 mg by mouth 2 (two) times daily with a meal. 04/17/18   [provider]  Multiple Vitamin (MULTIVITAMIN WITH MINERALS) TABS tablet Take 1 tablet by mouth daily.    [provider]  quinapril-hydrochlorothiazide (ACCURETIC) 10-12.5 MG per tablet Take 1 tablet by mouth daily.    [provider]    Physical Exam: Vitals:   06/24/20 1750 06/24/20 1751 06/24/20 1900 06/24/20 2115  BP: 114/66   137/83  Pulse: (!) 110   88  Resp: 18   18  Temp: 99.1 F (37.3 C)  (!) 100.4 F (38 C)   TempSrc: Oral  Rectal   SpO2: 99%   100%  Height:  6\' 5"  (1.956 m)      Constitutional: Looks chronically ill, but currently in NAD. Eyes: PERRL, lids and conjunctivae mildly injected. ENMT: Mucous membranes are dry. Posterior pharynx clear of any exudate or lesions. Neck: normal, supple, no masses, no thyromegaly Respiratory: Decreased breath sounds in bases, otherwise no wheezing, no crackles. Normal respiratory effort. No accessory muscle use.  Cardiovascular: Regular rate and rhythm, no murmurs / rubs / gallops. No extremity edema. 2+ pedal pulses. No carotid bruits.  Abdomen: No distention.  Bowel sounds positive.  Soft, no tenderness, no flank tenderness, no masses palpated. No hepatosplenomegaly. Musculoskeletal: Moderate generalized weakness.  No clubbing / cyanosis.  Good ROM, no contractures. Normal muscle  tone.  Skin: no acute rashes, lesions, ulcers on very limited dermatological examination. Neurologic: CN 2-12 grossly intact. Sensation intact, DTR normal.  Generalized, nonfocal weakness.Marland Kitchen  Psychiatric: Alert and oriented x 1, knows he is in the hospital, but thought he was [facility].  Disoriented to time, date and situation.  Labs on Admission: I have personally reviewed following labs and imaging studies  CBC: Recent Labs  Lab 06/24/20 1809  WBC 10.4  NEUTROABS 9.7*  HGB 9.6*  HCT 28.6*  MCV 92.9  PLT 124    Basic Metabolic Panel: Recent Labs  Lab 06/24/20 1809  NA 130*  K 6.0*  CL 98  CO2 20*  GLUCOSE 398*  BUN 37*  CREATININE 1.69*  CALCIUM 8.1*    GFR: CrCl cannot be calculated (Unknown ideal weight.).  Liver Function Tests: Recent Labs  Lab 06/24/20 1809  AST 76*  ALT 25  ALKPHOS 76  BILITOT 1.7*  PROT 5.9*  ALBUMIN 3.1*    Urine analysis:    Component Value Date/Time   COLORURINE YELLOW 06/24/2020 1955   APPEARANCEUR CLEAR 06/24/2020 1955   LABSPEC 1.016 06/24/2020 1955   PHURINE 5.0 06/24/2020 1955   GLUCOSEU >=500 (A) 06/24/2020 1955   HGBUR LARGE (A) 06/24/2020 1955   BILIRUBINUR NEGATIVE 06/24/2020 [place] NEGATIVE 06/24/2020 1955   PROTEINUR NEGATIVE 06/24/2020 1955   UROBILINOGEN 1.0 08/27/2007 1745   NITRITE NEGATIVE 06/24/2020 1955   LEUKOCYTESUR MODERATE (A) 06/24/2020 1955    Radiological Exams on Admission: DG Chest Port 1 View  Result Date: 06/24/2020 CLINICAL DATA:  Questionable sepsis EXAM: PORTABLE CHEST 1 VIEW COMPARISON:  06/03/2007 FINDINGS: Heart is normal size. Possible small left effusion. Patchy opacities in the left lower lung. Right lung clear. No acute bony abnormality. IMPRESSION: Suspect small left effusion with patchy opacities at the left lung base. This could reflect pneumonia. Electronically Signed   By: Rolm Baptise M.D.   On: 06/24/2020 19:14    EKG: Independently reviewed. Vent. rate 102 BPM PR  interval * ms QRS duration 99 ms QT/QTc 360/469 ms P-R-T axes 0 -25 30 Sinus tachycardia Atrial premature complex Borderline left axis deviation Consider anterior infarct  Assessment/Plan Principal Problem:   Sepsis due to undetermined organism POA ([place]) Admit to progressive unit/inpatient. Developing pneumonia versus recurring UTI Supplemental oxygen as needed. Continue IV fluids. Continue ceftriaxone 2 g IVPB daily. Continue azithromycin 500 mg IVPB at 24 hours. Follow-up blood culture and sensitivity. Follow-up urine culture and sensitivity. Follow-up CBC, electrolytes hepatic and renal function.  Active Problems:   Type 2 diabetes mellitus with hyperglycemia (HCC) Continue IV hydration. CBG monitoring before meals and bedtime. Sensitive RI SS. Hold Metformin (GFR and lactic acidosis). Continue Amaryl 4 mg p.o. daily. Check hemoglobin A1c.    Hyponatremia Continue NS infusion. Follow-up sodium level. Check Legionella urinary antigen.    Hyperkalemia Resolved. Follow-up potassium level.    Abnormal LFTs Follow-up CMP in a.m.    Hypertension Hold ACE inhibitor and diuretic due to volume depletion. Monitor blood pressure, renal function electrolytes. Resume antihypertensives as indicated.    Chronic kidney disease (CKD), stage III (moderate) (HCC) Close to baseline. Gentle IV hydration. Hold quinapril. Hold HCTZ. Monitor renal function electrolytes.    Barrett esophagus Protonix 40 mg p.o. daily.    Normocytic anemia Check anemia panel. Monitor hematocrit and hemoglobin.    Mild protein malnutrition (HCC) Protein supplementation. Nutritional services evaluation.    DVT prophylaxis: Lovenox SQ. Code Status:   Full code. Family Communication: Disposition Plan:   Patient is from:  Home.  Anticipated DC to:  Home.  Anticipated DC date:  06/26/2020 or 06/27/2020.  Anticipated DC barriers: Clinical status.  Consults called: Admission status:   Observation/telemetry.  The patient presented with fever, decreased oral intake and mentation for the past 2 to 3 days with labs showing signs of volume depletion, hyperkalemia, hyperglycemia lactic acidosis.  The patient will need to remain in the hospital for IV antibiotic therapy for 2 to 3 days pending blood and urine cultures/sensitivity.  Severity of Illness:  Reubin Milan MD [facility]  How to contact the [facility] Attending or Consulting provider [place] or covering provider during after hours [place], for this patient?   1. Check the care team in [facility] and look for a) attending/consulting [place] provider listed and b)  the [facility] team listed 2. Log into [email or web address] and use [place]'s universal password to access. If you do not have the password, please contact the hospital operator. 3. Locate the [facility] provider you are looking for under [facility] and page to a number that you can be directly reached. 4. If you still have difficulty reaching the provider, please page the [facility] (Director on Call) for the Hospitalists listed on amion for assistance.  06/24/2020, 10:20 PM   This document was prepared using Dragon voice recognition software and may contain some unintended transcription errors.

## 2020-06-24 NOTE — ED Notes (Signed)
Bed bugs found on pt. In specimen cup in room. Charge notified

## 2020-06-25 ENCOUNTER — Encounter (HOSPITAL_COMMUNITY): Payer: Self-pay | Admitting: Internal Medicine

## 2020-06-25 DIAGNOSIS — R4189 Other symptoms and signs involving cognitive functions and awareness: Secondary | ICD-10-CM

## 2020-06-25 DIAGNOSIS — W19XXXA Unspecified fall, initial encounter: Secondary | ICD-10-CM

## 2020-06-25 DIAGNOSIS — N1832 Chronic kidney disease, stage 3b: Secondary | ICD-10-CM

## 2020-06-25 DIAGNOSIS — J181 Lobar pneumonia, unspecified organism: Secondary | ICD-10-CM

## 2020-06-25 DIAGNOSIS — A419 Sepsis, unspecified organism: Secondary | ICD-10-CM | POA: Diagnosis present

## 2020-06-25 DIAGNOSIS — N3001 Acute cystitis with hematuria: Secondary | ICD-10-CM

## 2020-06-25 DIAGNOSIS — E1165 Type 2 diabetes mellitus with hyperglycemia: Secondary | ICD-10-CM

## 2020-06-25 DIAGNOSIS — Y92009 Unspecified place in unspecified non-institutional (private) residence as the place of occurrence of the external cause: Secondary | ICD-10-CM

## 2020-06-25 HISTORY — DX: Lobar pneumonia, unspecified organism: J18.1

## 2020-06-25 LAB — BLOOD CULTURE ID PANEL (REFLEXED) - BCID2

## 2020-06-25 LAB — CBC WITH DIFFERENTIAL/PLATELET
Abs Immature Granulocytes: 0.15 10*3/uL — ABNORMAL HIGH (ref 0.00–0.07)
Basophils Absolute: 0 10*3/uL (ref 0.0–0.1)
Basophils Relative: 0 %
Eosinophils Absolute: 0.1 10*3/uL (ref 0.0–0.5)
Eosinophils Relative: 0 %
HCT: 40.3 % (ref 39.0–52.0)
Hemoglobin: 13.1 g/dL (ref 13.0–17.0)
Immature Granulocytes: 1 %
Lymphocytes Relative: 13 %
Lymphs Abs: 1.9 10*3/uL (ref 0.7–4.0)
MCH: 30.2 pg (ref 26.0–34.0)
MCHC: 32.5 g/dL (ref 30.0–36.0)
MCV: 92.9 fL (ref 80.0–100.0)
Monocytes Absolute: 1.3 10*3/uL — ABNORMAL HIGH (ref 0.1–1.0)
Monocytes Relative: 9 %
Neutro Abs: 11 10*3/uL — ABNORMAL HIGH (ref 1.7–7.7)
Neutrophils Relative %: 77 %
Platelets: 258 10*3/uL (ref 150–400)
RBC: 4.34 MIL/uL (ref 4.22–5.81)
RDW: 12.6 % (ref 11.5–15.5)
WBC: 14.5 10*3/uL — ABNORMAL HIGH (ref 4.0–10.5)
nRBC: 0 % (ref 0.0–0.2)

## 2020-06-25 LAB — BASIC METABOLIC PANEL
Anion gap: 11 (ref 5–15)
Anion gap: 14 (ref 5–15)
BUN: 30 mg/dL — ABNORMAL HIGH (ref 8–23)
BUN: 28 mg/dL — ABNORMAL HIGH (ref 8–23)
CO2: 21 mmol/L — ABNORMAL LOW (ref 22–32)
CO2: 25 mmol/L (ref 22–32)
Calcium: 7.9 mg/dL — ABNORMAL LOW (ref 8.9–10.3)
Calcium: 9 mg/dL (ref 8.9–10.3)
Chloride: 104 mmol/L (ref 98–111)
Chloride: 100 mmol/L (ref 98–111)
Creatinine, Ser: 1.2 mg/dL (ref 0.61–1.24)
Creatinine, Ser: 1.28 mg/dL — ABNORMAL HIGH (ref 0.61–1.24)
GFR, Estimated: >60 mL/min (ref >60)
GFR, Estimated: 60 mL/min — ABNORMAL LOW (ref >60)
Glucose, Bld: 188 mg/dL — ABNORMAL HIGH (ref 70–99)
Glucose, Bld: 93 mg/dL (ref 70–99)
Potassium: 3.6 mmol/L (ref 3.5–5.1)
Potassium: 3.5 mmol/L (ref 3.5–5.1)
Sodium: 136 mmol/L (ref 135–145)
Sodium: 139 mmol/L (ref 135–145)

## 2020-06-25 LAB — I-STAT CHEM 8, ED
BUN: 33 mg/dL — ABNORMAL HIGH (ref 8–23)
Calcium, Ion: 1.12 mmol/L — ABNORMAL LOW (ref 1.15–1.40)
Chloride: 103 mmol/L (ref 98–111)
Creatinine, Ser: 1.1 mg/dL (ref 0.61–1.24)
Glucose, Bld: 185 mg/dL — ABNORMAL HIGH (ref 70–99)
HCT: 30 % — ABNORMAL LOW (ref 39.0–52.0)
Hemoglobin: 10.2 g/dL — ABNORMAL LOW (ref 13.0–17.0)
Potassium: 3.5 mmol/L (ref 3.5–5.1)
Sodium: 138 mmol/L (ref 135–145)
TCO2: 23 mmol/L (ref 22–32)

## 2020-06-25 LAB — CBG MONITORING, ED
Glucose-Capillary: 100 mg/dL — ABNORMAL HIGH (ref 70–99)
Glucose-Capillary: 133 mg/dL — ABNORMAL HIGH (ref 70–99)
Glucose-Capillary: 60 mg/dL — ABNORMAL LOW (ref 70–99)
Glucose-Capillary: 63 mg/dL — ABNORMAL LOW (ref 70–99)
Glucose-Capillary: 85 mg/dL (ref 70–99)
Glucose-Capillary: 87 mg/dL (ref 70–99)

## 2020-06-25 LAB — EXPECTORATED SPUTUM ASSESSMENT W GRAM STAIN, RFLX TO RESP C

## 2020-06-25 LAB — STREP PNEUMONIAE URINARY ANTIGEN: Strep Pneumo Urinary Antigen: NEGATIVE

## 2020-06-25 LAB — HEMOGLOBIN A1C
Hgb A1c MFr Bld: 5.2 % (ref 4.8–5.6)
Mean Plasma Glucose: 102.54 mg/dL

## 2020-06-25 LAB — IRON AND TIBC
Iron: 21 ug/dL — ABNORMAL LOW (ref 45–182)
Saturation Ratios: 9 % — ABNORMAL LOW (ref 17.9–39.5)
TIBC: 227 ug/dL — ABNORMAL LOW (ref 250–450)
UIBC: 206 ug/dL

## 2020-06-25 LAB — RETICULOCYTES
Immature Retic Fract: 11.2 % (ref 2.3–15.9)
RBC.: 4.37 MIL/uL (ref 4.22–5.81)
Retic Count, Absolute: 55.9 10*3/uL (ref 19.0–186.0)
Retic Ct Pct: 1.3 % (ref 0.4–3.1)

## 2020-06-25 LAB — SARS CORONAVIRUS 2 (TAT 6-24 HRS): SARS Coronavirus 2: NEGATIVE

## 2020-06-25 LAB — FERRITIN: Ferritin: 198 ng/mL (ref 24–336)

## 2020-06-25 LAB — VITAMIN B12: Vitamin B-12: 654 pg/mL (ref 180–914)

## 2020-06-25 LAB — POC OCCULT BLOOD, ED: Fecal Occult Bld: NEGATIVE

## 2020-06-25 LAB — FOLATE: Folate: >24.8 ng/mL (ref >5.9)

## 2020-06-25 MED ORDER — PANTOPRAZOLE SODIUM 40 MG PO TBEC
40.0000 mg | DELAYED_RELEASE_TABLET | Freq: Every day | ORAL | Status: DC
  Administered 2020-06-25 – 2020-06-29 (×5): 40 mg via ORAL
  Filled 2020-06-25 (×5): qty 1

## 2020-06-25 MED ORDER — SODIUM CHLORIDE 0.9 % IV SOLN: INTRAVENOUS | Status: DC

## 2020-06-25 MED ORDER — INSULIN ASPART 100 UNIT/ML ~~LOC~~ SOLN
0.0000 [IU] | Freq: Three times a day (TID) | SUBCUTANEOUS | Status: DC
  Administered 2020-06-26: 3 [IU] via SUBCUTANEOUS
  Filled 2020-06-25: qty 0.09

## 2020-06-25 NOTE — ED Notes (Signed)
Attempted to call report x 1- asked to call back later d/t shift change

## 2020-06-25 NOTE — Progress Notes (Signed)
  Patient received from ED.     06/25/20 2037  Vitals  Temp 98.7 F (37.1 C)  Temp Source Oral  BP 136/78  MAP (mmHg) 97  BP Location Right Arm  BP Method Automatic  Patient Position (if appropriate) Lying  Pulse Rate 80  Pulse Rate Source Monitor  Resp 18  MEWS COLOR  MEWS Score Color Green  Oxygen Therapy  SpO2 93 %  O2 Device Room Air

## 2020-06-25 NOTE — ED Notes (Signed)
Pt condom cath had been removed, placed new one on pt. Removed soiled lines and new ones placed on bed w pads. Gave pt blanket and call bell.

## 2020-06-25 NOTE — ED Notes (Signed)
Brought pt food trey, pt ate and drank independently

## 2020-06-25 NOTE — ED Notes (Addendum)
Wife called ED wanting to come visit pt. Attempted to explain to wife that pt has bed bugs and is being tested for covid, so he cannot have a visitor now per hospital policy. Wife was very irate and rude over the phone, demanding to come back, and saying "what's wrong with you people?...you guys are crazy and rude... and what's the big deal about a bed bug?!". Attempted to calm wife down, reassure her we were taking care of pt and that the hospitalist said earlier in the day that he would be reaching out to her. Wife continued to be rude, yelling and insulting. This RN told wife I had other patients to care for and needed to get off the phone.

## 2020-06-25 NOTE — Progress Notes (Signed)
PROGRESS NOTE  [name] DJS:[id number] DOB: January 07, 1951 DOA: 06/24/2020 PCP: Ronald Gravel, MD  Brief History   70 year old man PMH including diabetes mellitus, CKD, possibly early dementia, possible NPH, possible early parkinsonian syndrome presented with worsening weakness over several days, found on floor by wife, febrile per EMS. Admitted for sepsis secondary to pneumonia, UTI. A & P  Sepsis secondary to pneumonia, UTI --Hemodynamics stable. No hypoxia. Continue empiric antibiotics. Follow-up culture data.  Diabetes mellitus type 2 with hypoglycemia. Hold Metformin. --Probably from decreased oral intake. Stop Amaryl. Continue sliding scale insulin.  Variable hemoglobin values, suspect spurious laboratory samples. Suspect hemoglobin at baseline. --No evidence of bleeding. CBC in a.m.  Various laboratory values chemistry and CBC. Nursing gotten change in shift of the patient had Covid. I see no signs or symptoms to suggest Covid. However given various laboratory inconsistencies, concern for mixed up attribution of samples. --Repeat Covid test but no suspicion for active Covid at this time.  CKD stage IIIb --Variable laboratory values unclear which is adherent. Hold hydrochlorothiazide and ACE inhibitor --IV fluids. Check BMP in a.m.  Essential hypertension. --Hold ACE inhibitor for now  Barrett's esophagus, GERD --Continue PPI  Cognitive impairment. Neurology note from December 2021 noted. Concern for earlier parkinsonian is him, NPH. Possible NPH referred to neurosurgery for evaluation of temporary lumbar drain or shunting. Formal neurocognitive and memory testing showed possible component of NPH. Possible early parkinsonian syndrome followed by neurology as an outpatient, considering movement disorder team opinion after neurosurgery evaluation. --Will discuss with primary neurologist tomorrow. Consider inpatient neurology consultation depending on clinical course.  Fall at  home, social. Per nursing, bedbugs discovered. In review of chart,/saw neurology in December at which time it was noted patient had been evicted. At that time suggestion was made for consideration of ALF. --No apparent injury. Patient observed standing without apparent difficulty.  --PT, OT eval. TOC consultation. --permethrin    Nutritional Assessment: Body mass index is 33.8 kg/m.Marland Kitchen Seen by dietician.  I agree with the assessment and plan as outlined below: Nutrition Status:       Disposition Plan:  Discussion: Appears to be stabilizing, downgrade to telemetry status. Continue antibiotics. Repeat laboratory studies in the morning. PT and OT evaluations. Rule out Covid (low suspicion). Treat bedbugs.  Status is: Inpatient  Remains inpatient appropriate because:IV treatments appropriate due to intensity of illness or inability to take PO and Inpatient level of care appropriate due to severity of illness   Dispo: The patient is from: Home              Anticipated d/c is to: unclear              Anticipated d/c date is: 2 days              Patient currently is not medically stable to d/c.   Difficult to place patient No  DVT prophylaxis: enoxaparin (LOVENOX) injection 40 mg Start: 06/24/20 2200   Code Status: Full Code Level of care: Telemetry Family Communication: will call wife later today  Ronald Hodgkins, MD  [facility] Direct contact: see [email or web address] (further directions at bottom of note if needed) 7PM-7AM contact night coverage as at bottom of note 06/25/2020, 10:51 AM  LOS: 1 day   Significant Hospital Events   .    Consults:  .    Procedures:  .   Significant Diagnostic Tests:  Marland Kitchen    Micro Data:  .    Antimicrobials:  .  Interval History/Subjective  CC: f/u confusion  Reports gets confused especially when outside of his house. No pain. No difficulty breathing.  Per RN, bedbugs discovered.  Objective   Vitals:  Vitals:   06/25/20 0807  06/25/20 1002  BP:  115/72  Pulse:  73  Resp:  20  Temp: 98.2 F (36.8 C)   SpO2:  100%    Exam:  Constitutional:   . Appears calm and comfortable. Earlier standing in room naked. ENMT:  . grossly normal hearing  . Lips appear normal Respiratory:  . CTA bilaterally, no w/r/r.  . Respiratory effort normal.  Cardiovascular:  . RRR, no m/r/g . No LE extremity edema   Abdomen:  . Soft ntnd . Abdomen appears normal Musculoskeletal:  . Digits/nails BUE: no clubbing, cyanosis, petechiae, infection . RUE, LUE, RLE, LLE   . strength and tone grossly normal Skin:  . No rashes, lesions, ulcers . Both feet very dry skin with hyperkeratosis Neurologic:  . Grossly nonfocal Psychiatric:  . Mental status o Mood, affect appropriate . Orientation to person, "[name]", not time   I have personally reviewed the following:   Today's Data  . Blood sugars hypoglycemic at times . Creatinine elevated at 1.28 remainder BMP unremarkable . Anemia profile unremarkable . Multiple CBCs last 24 hours with various values, unclear which is true . Urinalysis grossly positive Chest x-ray independently reviewed with left base infiltrate  Scheduled Meds: . azithromycin  250 mg Oral Daily  . enoxaparin (LOVENOX) injection  40 mg Subcutaneous Q24H  . insulin aspart  0-9 Units Subcutaneous TID WC  . pantoprazole  40 mg Oral Daily  . permethrin   Topical Once   Continuous Infusions: . sodium chloride    . cefTRIAXone (ROCEPHIN)  IV      Principal Problem:   Sepsis due to undetermined organism [facility]) Active Problems:   Hypertension   Chronic kidney disease (CKD), stage III (moderate) (HCC)   Barrett esophagus   Mild protein malnutrition (HCC)   Type 2 diabetes mellitus with hyperglycemia ([place])   Lobar pneumonia ([place])   Fall at home, initial encounter   Cognitive impairment   LOS: 1 day   How to contact the [facility] Attending or Consulting provider [name] or covering provider during  after hours [name], for this patient?  1. Check the care team in [facility] and look for a) attending/consulting TRH provider listed and b) the [facility] team listed 2. Log into [email or web address] and use [place]'s universal password to access. If you do not have the password, please contact the hospital operator. 3. Locate the [facility] provider you are looking for under [facility] and page to a number that you can be directly reached. 4. If you still have difficulty reaching the provider, please page the [facility] (Director on Call) for the Hospitalists listed on amion for assistance.

## 2020-06-25 NOTE — ED Notes (Signed)
After obtaining CBG of 63, RN gave pt two cups of orange juice with sugar. Will alert dayshift to need to check CBG at shift change.

## 2020-06-25 NOTE — ED Notes (Signed)
Pt bgl was 60, gave pt 1 cup orange juice, 1 cup apple juice and sandwich. Will re-check bgl shortly.

## 2020-06-25 NOTE — ED Notes (Signed)
CBG- 60

## 2020-06-25 NOTE — Hospital Course (Addendum)
70 year old man PMH including diabetes mellitus, CKD, possibly early dementia, possible NPH, possible early parkinsonian syndrome presented with worsening weakness over several days, found on floor by wife, febrile per EMS. Admitted for sepsis secondary to pneumonia, UTI.  Condition rapidly improved, at this point pneumonia and UTI appear clinically resolved.  SNF recommended.    A & P  Sepsis secondary to pneumonia, UTI --Sepsis resolved.  Pneumonia appears clinically resolved at this point.  UTI appears resolved. --Complete antibiotics tomorrow.  Diabetes mellitus type 2 with hypoglycemia. Hold Metformin. --Fasting blood sugar good, gets hyperglycemic later in the day.  Can resume metformin on discharge.  Mild normocytic anemia --Stable.  Essential hypertension. --Resume ACE inhibitor on discharge  Barrett's esophagus, GERD --Continue PPI  Cognitive impairment. Neurology note from December 2021 reviewed. Concern for early Parkinsonism, NPH. Referred to neurosurgery for evaluation of temporary lumbar drain or shunting. --Follow-up as an outpatient --Was to have a drain placed next week, I have communicated with Dr. Zada Finders who is aware and office will reschedule --Follow-up with neurology as an outpatient  Fall at home, social. Per nursing, bedbugs discovered. In review of chart,/saw neurology in December at which time it was noted patient had been evicted. At that time suggestion was made for consideration of ALF. --SNF recommended, wife agrees  Moderate malnutrition --Management as per dietitian which includes Ensure and boost breeze

## 2020-06-26 DIAGNOSIS — E11649 Type 2 diabetes mellitus with hypoglycemia without coma: Secondary | ICD-10-CM

## 2020-06-26 DIAGNOSIS — D649 Anemia, unspecified: Secondary | ICD-10-CM

## 2020-06-26 LAB — MAGNESIUM: Magnesium: 1.5 mg/dL — ABNORMAL LOW (ref 1.7–2.4)

## 2020-06-26 LAB — LEGIONELLA PNEUMOPHILA SEROGP 1 UR AG: L. pneumophila Serogp 1 Ur Ag: NEGATIVE

## 2020-06-26 LAB — CBC
HCT: 33.4 % — ABNORMAL LOW (ref 39.0–52.0)
Hemoglobin: 10.8 g/dL — ABNORMAL LOW (ref 13.0–17.0)
MCH: 30.7 pg (ref 26.0–34.0)
MCHC: 32.3 g/dL (ref 30.0–36.0)
MCV: 94.9 fL (ref 80.0–100.0)
Platelets: 216 K/uL (ref 150–400)
RBC: 3.52 MIL/uL — ABNORMAL LOW (ref 4.22–5.81)
RDW: 12.6 % (ref 11.5–15.5)
WBC: 8.5 K/uL (ref 4.0–10.5)
nRBC: 0 % (ref 0.0–0.2)

## 2020-06-26 LAB — GLUCOSE, CAPILLARY
Glucose-Capillary: 128 mg/dL — ABNORMAL HIGH (ref 70–99)
Glucose-Capillary: 138 mg/dL — ABNORMAL HIGH (ref 70–99)
Glucose-Capillary: 145 mg/dL — ABNORMAL HIGH (ref 70–99)
Glucose-Capillary: 223 mg/dL — ABNORMAL HIGH (ref 70–99)
Glucose-Capillary: 233 mg/dL — ABNORMAL HIGH (ref 70–99)
Glucose-Capillary: 239 mg/dL — ABNORMAL HIGH (ref 70–99)
Glucose-Capillary: 52 mg/dL — ABNORMAL LOW (ref 70–99)
Glucose-Capillary: 54 mg/dL — ABNORMAL LOW (ref 70–99)
Glucose-Capillary: 54 mg/dL — ABNORMAL LOW (ref 70–99)
Glucose-Capillary: 63 mg/dL — ABNORMAL LOW (ref 70–99)
Glucose-Capillary: 66 mg/dL — ABNORMAL LOW (ref 70–99)
Glucose-Capillary: 95 mg/dL (ref 70–99)

## 2020-06-26 LAB — COMPREHENSIVE METABOLIC PANEL
ALT: 24 U/L (ref 0–44)
AST: 47 U/L — ABNORMAL HIGH (ref 15–41)
Albumin: 2.8 g/dL — ABNORMAL LOW (ref 3.5–5.0)
Alkaline Phosphatase: 60 U/L (ref 38–126)
Anion gap: 10 (ref 5–15)
BUN: 23 mg/dL (ref 8–23)
CO2: 24 mmol/L (ref 22–32)
Calcium: 8.3 mg/dL — ABNORMAL LOW (ref 8.9–10.3)
Chloride: 103 mmol/L (ref 98–111)
Creatinine, Ser: 1.13 mg/dL (ref 0.61–1.24)
GFR, Estimated: >60 mL/min (ref >60)
Glucose, Bld: 92 mg/dL (ref 70–99)
Potassium: 4.1 mmol/L (ref 3.5–5.1)
Sodium: 137 mmol/L (ref 135–145)
Total Bilirubin: 0.5 mg/dL (ref 0.3–1.2)
Total Protein: 5.4 g/dL — ABNORMAL LOW (ref 6.5–8.1)

## 2020-06-26 LAB — PHOSPHORUS: Phosphorus: 2.2 mg/dL — ABNORMAL LOW (ref 2.5–4.6)

## 2020-06-26 MED ORDER — ENSURE ENLIVE PO LIQD
237.0000 mL | Freq: Two times a day (BID) | ORAL | Status: DC
  Administered 2020-06-26 – 2020-06-27 (×3): 237 mL via ORAL

## 2020-06-26 NOTE — Progress Notes (Signed)
PHARMACY - PHYSICIAN COMMUNICATION CRITICAL VALUE ALERT - BLOOD CULTURE IDENTIFICATION (BCID)  [name] is an 70 y.o. male who presented to [facility] on 06/24/2020 with a chief complaint of weakness  Assessment:   Admit with sepsis and suspected source of PNA vs UTI.  Clinically improving on Rocephin + Zithromax. CXR suggestive of possible PNA and resp cx with GPC in pains/chains and gram variable rod.  Strep pneumoniae UA negative.  Blood cx now growing GPC- (BCID + staph species, no resistance) in anaerobic bottle of 1 set.  This is likely contaminant.    Name of physician (or Provider) ContactedStark Klein, NP  Current antibiotics: Rocephin + Zithromax  Changes to prescribed antibiotics recommended:  Continue current antibiotics for PNA coverage  & monitor clinically, follow final cx data.    Results for orders placed or performed during the hospital encounter of 06/24/20  Blood Culture ID Panel (Reflexed) (Collected: 06/24/2020  6:10 PM)  Result Value Ref Range   Enterococcus faecalis NOT DETECTED NOT DETECTED   Enterococcus Faecium NOT DETECTED NOT DETECTED   Listeria monocytogenes NOT DETECTED NOT DETECTED   Staphylococcus species DETECTED (A) NOT DETECTED   Staphylococcus aureus (BCID) NOT DETECTED NOT DETECTED   Staphylococcus epidermidis NOT DETECTED NOT DETECTED   Staphylococcus lugdunensis NOT DETECTED NOT DETECTED   Streptococcus species NOT DETECTED NOT DETECTED   Streptococcus agalactiae NOT DETECTED NOT DETECTED   Streptococcus pneumoniae NOT DETECTED NOT DETECTED   Streptococcus pyogenes NOT DETECTED NOT DETECTED   A.calcoaceticus-baumannii NOT DETECTED NOT DETECTED   Bacteroides fragilis NOT DETECTED NOT DETECTED   Enterobacterales NOT DETECTED NOT DETECTED   Enterobacter cloacae complex NOT DETECTED NOT DETECTED   Escherichia coli NOT DETECTED NOT DETECTED   Klebsiella aerogenes NOT DETECTED NOT DETECTED   Klebsiella oxytoca NOT DETECTED NOT DETECTED    Klebsiella pneumoniae NOT DETECTED NOT DETECTED   Proteus species NOT DETECTED NOT DETECTED   Salmonella species NOT DETECTED NOT DETECTED   Serratia marcescens NOT DETECTED NOT DETECTED   Haemophilus influenzae NOT DETECTED NOT DETECTED   Neisseria meningitidis NOT DETECTED NOT DETECTED   Pseudomonas aeruginosa NOT DETECTED NOT DETECTED   Stenotrophomonas maltophilia NOT DETECTED NOT DETECTED   Candida albicans NOT DETECTED NOT DETECTED   Candida auris NOT DETECTED NOT DETECTED   Candida glabrata NOT DETECTED NOT DETECTED   Candida krusei NOT DETECTED NOT DETECTED   Candida parapsilosis NOT DETECTED NOT DETECTED   Candida tropicalis NOT DETECTED NOT DETECTED   Cryptococcus neoformans/gattii NOT DETECTED NOT DETECTED    Netta Cedars PharmD 06/26/2020  12:27 AM

## 2020-06-26 NOTE — Evaluation (Signed)
Physical Therapy Evaluation Patient Details Name: [name] MRN: [id number] DOB: 02-05-1951 Today's Date: 06/26/2020   History of Present Illness  Patient is a 70 year old man PMH including DM, CKD, possibly early dementia, possible NPH, possible early parkinsonian syndrome presented with worsening weakness over several days, found on floor by wife, febrile per EMS. Admitted for sepsis secondary to pneumonia, UTI.  Clinical Impression  Pt admitted with above diagnosis.  Pt currently with functional limitations due to the deficits listed below (see PT Problem List). Pt will benefit from skilled PT to increase their independence and safety with mobility to allow discharge to the venue listed below.   Pt assisted with ambulating in room.  Pt requiring min assist for sit to stands and also during mobility for stability.  Pt also wished to stand at sink and perform hygiene to periarea and did not listen to cues to clean front to back and was cleaning back to front - notified nurse tech.  Pt reports his spouse is "smaller then me" and he feels she needs assist caring for him.  Recommend SNF at this time if spouse unable to provide current level of assist.  If home, pt would benefit from more home health services such as aide.     Follow Up Recommendations SNF    Equipment Recommendations  None recommended by PT    Recommendations for Other Services       Precautions / Restrictions Precautions Precautions: Fall Restrictions Weight Bearing Restrictions: No      Mobility  Bed Mobility Overal bed mobility: Needs Assistance Bed Mobility: Supine to Sit     Supine to sit: Supervision     General bed mobility comments: pt in recliner on arrival    Transfers Overall transfer level: Needs assistance Equipment used: Rolling walker (2 wheeled) Transfers: Sit to/from Stand Sit to Stand: Min assist         General transfer comment: assist for steadying upon rise and controlling  descent  Ambulation/Gait Ambulation/Gait assistance: Min assist Gait Distance (Feet): 50 Feet Assistive device: Rolling walker (2 wheeled) Gait Pattern/deviations: Step-through pattern;Decreased stride length;Narrow base of support;Shuffle     General Gait Details: slow short steps, pt reports difficulty intiating but does well "once he gets going"; required assist for steadying to prevent fall x2  Stairs            Wheelchair Mobility    Modified Rankin (Stroke Patients Only)       Balance Overall balance assessment: Needs assistance Sitting-balance support: Feet supported Sitting balance-Leahy Scale: Good     Standing balance support: Single extremity supported Standing balance-Leahy Scale: Poor Standing balance comment: static is fair however poor with any challenge or while weight shifting                             Pertinent Vitals/Pain Pain Assessment: No/denies pain    Home Living Family/patient expects to be discharged to:: Private residence Living Arrangements: Spouse/significant other Available Help at Discharge: Family Type of Home: House Home Access: Stairs to enter Entrance Stairs-Rails:  (rail in center) Technical brewer of Steps: 3 Home Layout: One level Home Equipment: Environmental consultant - 2 wheels      Prior Function Level of Independence: Needs assistance   Gait / Transfers Assistance Needed: reports using walker prior to admission and needing help from spouse with steps  ADL's / Homemaking Assistance Needed: spouse is my "primary care taker" assisted with ADL/IADLs.  pt states has been sponge bathing vs transfer into step in shower        Hand Dominance   Dominant Hand: Right    Extremity/Trunk Assessment   Upper Extremity Assessment Upper Extremity Assessment: Generalized weakness    Lower Extremity Assessment Lower Extremity Assessment: Generalized weakness    Cervical / Trunk Assessment Cervical / Trunk Assessment:  Normal  Communication   Communication: No difficulties  Cognition Arousal/Alertness: Awake/alert Behavior During Therapy: WFL for tasks assessed/performed Overall Cognitive Status: No family/caregiver present to determine baseline cognitive functioning                                 General Comments: orientated to person and place; poor safety awareness      General Comments      Exercises     Assessment/Plan    PT Assessment Patient needs continued PT services  PT Problem List Decreased strength;Decreased mobility;Decreased activity tolerance;Decreased coordination;Decreased knowledge of use of DME;Decreased balance;Decreased safety awareness       PT Treatment Interventions DME instruction;Gait training;Balance training;Therapeutic exercise;Functional mobility training;Therapeutic activities;Patient/family education    PT Goals (Current goals can be found in the Care Plan section)  Acute Rehab PT Goals Patient Stated Goal: "Whatever my wife thinks" PT Goal Formulation: With patient Time For Goal Achievement: 07/10/20 Potential to Achieve Goals: Good    Frequency Min 2X/week   Barriers to discharge        Co-evaluation               AM-PAC PT "6 Clicks" Mobility  Outcome Measure Help needed turning from your back to your side while in a flat bed without using bedrails?: A Little Help needed moving from lying on your back to sitting on the side of a flat bed without using bedrails?: A Little Help needed moving to and from a bed to a chair (including a wheelchair)?: A Little Help needed standing up from a chair using your arms (e.g., wheelchair or bedside chair)?: A Little Help needed to walk in hospital room?: A Little Help needed climbing 3-5 steps with a railing? : A Little 6 Click Score: 18    End of Session Equipment Utilized During Treatment: Gait belt Activity Tolerance: Patient tolerated treatment well Patient left: in chair;with call  bell/phone within reach;with chair alarm set Nurse Communication: Mobility status PT Visit Diagnosis: Other abnormalities of gait and mobility (R26.89)    Time: 1470-9295 PT Time Calculation (min) (ACUTE ONLY): 18 min   Charges:   PT Evaluation $PT Eval Low Complexity: 1 Low         Kati PT, DPT Acute Rehabilitation Services Pager: [phone] Office: [place] E 06/26/2020, 12:40 PM

## 2020-06-26 NOTE — Progress Notes (Signed)
PROGRESS NOTE  [name] QMG:[id number] DOB: [birth year] DOA: 06/24/2020 PCP: Jani Gravel, MD  Brief History   70 year old man PMH including diabetes mellitus, CKD, possibly early dementia, possible NPH, possible early parkinsonian syndrome presented with worsening weakness over several days, found on floor by wife, febrile per EMS. Admitted for sepsis secondary to pneumonia, UTI. A & P  Sepsis secondary to pneumonia, UTI --Improving.  Sepsis phenomenon resolved.  Continue antibiotics for pneumonia and UTI.  Diabetes mellitus type 2 with hypoglycemia. Hold Metformin. --Multiple episodes of hypoglycemia.  Suspect secondary to poor appetite. --Continue care per protocol treat hypoglycemia as needed, no antihyperglycemics.  Mild normocytic anemia --Stable.  Essential hypertension. --Hold ACE inhibitor for now  Barrett's esophagus, GERD --Continue PPI  Cognitive impairment. Neurology note from December 2021 reviewed. Concern for early Parkinsonism, NPH. Referred to neurosurgery for evaluation of temporary lumbar drain or shunting. Formal neurocognitive and memory testing showed possible component of NPH. Followed by neurology as an outpatient, considering movement disorder team opinion after neurosurgery evaluation. --s/u as an outpatient  Fall at home, social. Per nursing, bedbugs discovered. In review of chart,/saw neurology in December at which time it was noted patient had been evicted. At that time suggestion was made for consideration of ALF. --Pursue therapy evaluations, TOC involved to coordinate safe discharge   Disposition Plan:  Discussion: Appears to be improving.  Discontinue telemetry.  Continue antibiotics.  Therapy evaluations.  Status is: Inpatient  Remains inpatient appropriate because:IV treatments appropriate due to intensity of illness or inability to take PO and Inpatient level of care appropriate due to severity of illness   Dispo: The patient is from:  Home              Anticipated d/c is to: unclear              Anticipated d/c date is: 2 days              Patient currently is not medically stable to d/c.   Difficult to place patient No  DVT prophylaxis: enoxaparin (LOVENOX) injection 40 mg Start: 06/24/20 2200   Code Status: Full Code Level of care: Telemetry Family Communication:   Murray Hodgkins, MD  [facility] Direct contact: see [email or web address] (further directions at bottom of note if needed) 7PM-7AM contact night coverage as at bottom of note 06/26/2020, 2:39 PM  LOS: 2 days   Significant Hospital Events   .    Consults:  .    Procedures:  .   Significant Diagnostic Tests:  Marland Kitchen    Micro Data:  .    Antimicrobials:  .   Interval History/Subjective  CC: f/u confusion  No complaints today.  Tolerating diet.  Somewhat confused, history not clearly reliable.  Objective   Vitals:  Vitals:   06/26/20 1029 06/26/20 1421  BP: 121/77 132/71  Pulse: 83 93  Resp: 18 18  Temp: 98.8 F (37.1 C) 98.4 F (36.9 C)  SpO2: 100% 100%    Exam:  Constitutional:   . Appears calm and comfortable ENMT:  . grossly normal hearing  Respiratory:  . CTA bilaterally, no w/r/r.  . Respiratory effort normal.  Cardiovascular:  . RRR, no m/r/g Psychiatric:  . Mental status o Mood appropriate, affect odd  I have personally reviewed the following:   Today's Data  Multiple episodes of hypoglycemia overnight and this morning Magnesium 1.5, remainder BMP unremarkable AST slightly elevated at 47 Hemoglobin stable 10.8 remainder CBC unremarkable  Scheduled Meds: . azithromycin  250 mg Oral Daily  . enoxaparin (LOVENOX) injection  40 mg Subcutaneous Q24H  . feeding supplement  237 mL Oral BID BM  . insulin aspart  0-9 Units Subcutaneous TID WC  . pantoprazole  40 mg Oral Daily  . permethrin   Topical Once   Continuous Infusions: . sodium chloride 75 mL/hr at 06/26/20 1036  . cefTRIAXone (ROCEPHIN)  IV 2 g (06/25/20  2317)    Principal Problem:   Sepsis due to undetermined organism [facility]) Active Problems:   Hypertension   Chronic kidney disease (CKD), stage III (moderate) (HCC)   Barrett esophagus   Mild protein malnutrition (HCC)   Type 2 diabetes mellitus with hyperglycemia ([place])   Lobar pneumonia ([place])   Fall at home, initial encounter   Cognitive impairment   LOS: 2 days   How to contact the [facility] Attending or Consulting provider [place] or covering provider during after hours [place], for this patient?  1. Check the care team in [facility] and look for a) attending/consulting TRH provider listed and b) the [facility] team listed 2. Log into [email or web address] and use [place]'s universal password to access. If you do not have the password, please contact the hospital operator. 3. Locate the [facility] provider you are looking for under [facility] and page to a number that you can be directly reached. 4. If you still have difficulty reaching the provider, please page the [facility] (Director on Call) for the Hospitalists listed on amion for assistance.

## 2020-06-26 NOTE — Progress Notes (Signed)
Hypoglycemic Event  CBG:  54 @ 0009  Treatment:  Taking PO, snacks   Symptoms:  none   Follow-up CBG: Time: 0104   CBG Result: 95  (Please see CBG results done)  Possible Reasons for Event: no dinner   Comments/MD notified:  Sent message to on-call MD @ [phone], awaiting.    Raman Featherston L Mollye Guinta

## 2020-06-26 NOTE — Evaluation (Signed)
Occupational Therapy Evaluation Patient Details Name: [name] MRN: [id number] DOB: November 17, 1950 Today's Date: 06/26/2020    History of Present Illness Patient is a 70 year old man PMH including DM, CKD, possibly early dementia, possible NPH, possible early parkinsonian syndrome presented with worsening weakness over several days, found on floor by wife, febrile per EMS. Admitted for sepsis secondary to pneumonia, UTI.   Clinical Impression   Patient lives with spouse in [facility] with 3 STE. Patient states his spouse is his caregiver, assists with ADLs/IADLs as needed. Pt also reports started using walker "before I came here" to assist with balance. Patient is very pleasant/cooperative however does present with some cognitive deficits oriented to self + place only and having moderate word finding difficulty. Patient currently set up for UB ADL, min A for LB ADLs and min A for functional transfers needing min cues for safety. When discussing discharge planning with patient he states it is up to his wife "she can't help me too much physically." If patient progresses well in hospital may be able to D/C home with [facility], otherwise may need ST rehab to maximize independence in order to reduce caregiver burden. Acute OT to follow.    Follow Up Recommendations  Home health OT;Supervision/Assistance - 24 hour;Other (comment) (vs SNF if pt's spouse cannot provide current level of A)    Equipment Recommendations  Toilet riser       Precautions / Restrictions Precautions Precautions: Fall Restrictions Weight Bearing Restrictions: No      Mobility Bed Mobility Overal bed mobility: Needs Assistance Bed Mobility: Supine to Sit     Supine to sit: Supervision     General bed mobility comments: S forsafety    Transfers Overall transfer level: Needs assistance Equipment used: Rolling walker (2 wheeled) Transfers: Sit to/from Stand Sit to Stand: Min assist;From elevated surface          General transfer comment: cues for hand placement and min A for steadying assistance, elevated bed height as patient is 6'5    Balance Overall balance assessment: Needs assistance Sitting-balance support: Feet supported Sitting balance-Leahy Scale: Good     Standing balance support: Single extremity supported Standing balance-Leahy Scale: Poor Standing balance comment: UE support in standing for peri care                           ADL either performed or assessed with clinical judgement   ADL Overall ADL's : Needs assistance/impaired Eating/Feeding: Independent;Sitting Eating/Feeding Details (indicate cue type and reason): able to open containers on food tray, drink juice Grooming: Wash/dry face;Wash/dry hands;Set up;Sitting   Upper Body Bathing: Set up;Sitting   Lower Body Bathing: Minimal assistance;Sit to/from stand;Sitting/lateral leans Lower Body Bathing Details (indicate cue type and reason): patient able to wash peri area in standing with min A for balance and thoroughness Upper Body Dressing : Set up;Sitting   Lower Body Dressing: Minimal assistance;Sitting/lateral leans Lower Body Dressing Details (indicate cue type and reason): patient perform figure 4 to don socks seated at EOB, needed assist to start sock on R foot, then could pull up without assist Toilet Transfer: Minimal assistance;Cueing for safety;Cueing for sequencing;Ambulation;RW;BSC Toilet Transfer Details (indicate cue type and reason): to recliner, min cues for hand placement and min A to steady, elevated bed height for sit to stand from EOB as patient is quite tall Toileting- Clothing Manipulation and Hygiene: Minimal assistance;Sit to/from stand Toileting - Clothing Manipulation Details (indicate cue type and reason): for  standing balance/thoroughness     Functional mobility during ADLs: Minimal assistance;Cueing for safety;Cueing for sequencing;Rolling walker General ADL Comments: patient  needing assist with ADLs due to decreased balance, safety, activity tolerance                  Pertinent Vitals/Pain Pain Assessment: No/denies pain     Hand Dominance Right   Extremity/Trunk Assessment Upper Extremity Assessment Upper Extremity Assessment: Generalized weakness   Lower Extremity Assessment Lower Extremity Assessment: Defer to PT evaluation       Communication Communication Communication: No difficulties   Cognition Arousal/Alertness: Awake/alert Behavior During Therapy: WFL for tasks assessed/performed Overall Cognitive Status: No family/caregiver present to determine baseline cognitive functioning                                 General Comments: oriented to place, not month/year, knows he was admitted 2 days ago. intermittent word finding difficulty              Home Living Family/patient expects to be discharged to:: Private residence Living Arrangements: Spouse/significant other Available Help at Discharge: Family Type of Home: House Home Access: Stairs to enter Technical brewer of Steps: 3 Entrance Stairs-Rails:  (rail in center) Home Layout: One level     Bathroom Shower/Tub: Occupational psychologist: Vincent: Environmental consultant - 2 wheels          Prior Functioning/Environment Level of Independence: Needs assistance  Gait / Transfers Assistance Needed: started walking with walker "before I came here" ADL's / Homemaking Assistance Needed: spouse is my "primary care taker" assisted with ADL/IADLs. pt states has been sponge bathing vs transfer into step in shower            OT Problem List: Decreased strength;Decreased activity tolerance;Impaired balance (sitting and/or standing);Decreased cognition;Decreased safety awareness      OT Treatment/Interventions: Self-care/ADL training;Therapeutic exercise;DME and/or AE instruction;Therapeutic activities;Cognitive  remediation/compensation;Patient/family education;Balance training    OT Goals(Current goals can be found in the care plan section) Acute Rehab OT Goals Patient Stated Goal: "Whatever my wife thinks" OT Goal Formulation: With patient Time For Goal Achievement: 07/10/20 Potential to Achieve Goals: Good  OT Frequency: Min 2X/week    AM-PAC OT "6 Clicks" Daily Activity     Outcome Measure Help from another person eating meals?: None Help from another person taking care of personal grooming?: A Little Help from another person toileting, which includes using toliet, bedpan, or urinal?: A Little Help from another person bathing (including washing, rinsing, drying)?: A Little Help from another person to put on and taking off regular upper body clothing?: A Little Help from another person to put on and taking off regular lower body clothing?: A Little 6 Click Score: 19   End of Session Equipment Utilized During Treatment: Rolling walker Nurse Communication: Mobility status  Activity Tolerance: Patient tolerated treatment well Patient left: in chair;with call bell/phone within reach;with chair alarm set  OT Visit Diagnosis: Unsteadiness on feet (R26.81);History of falling (Z91.81);Muscle weakness (generalized) (M62.81);Other symptoms and signs involving cognitive function                Time: 4010-2725 OT Time Calculation (min): 25 min Charges:  OT General Charges $OT Visit: 1 Visit OT Evaluation $OT Eval Low Complexity: 1 Low OT Treatments $Self Care/Home Management : 8-22 mins  Delbert Phenix OT OT pager: [place]  06/26/2020, 10:08 AM

## 2020-06-26 NOTE — TOC Progression Note (Signed)
Transition of Care [facility]) - Progression Note    Patient Details  Name: [name] MRN: [id number] Date of Birth: [birth year]  [facility] of [facility]) CM/SW Contact  Purcell Mouton, RN Phone Number: 06/26/2020, 12:56 PM  Clinical Narrative:    Spoke with pt's wife concerning discharge plans. Pt's wife states, "right now we live in a hotel, we were evicted from our home. We are staying at a [facility] on [address]. I am not sure what he will need." TOC will continue to follow.   Expected Discharge Plan: Home/Self Care Barriers to Discharge: No Barriers Identified  Expected Discharge Plan and Services Expected Discharge Plan: Home/Self Care       Living arrangements for the past 2 months: Hotel/Motel ([place])                                       Social Determinants of Health (SDOH) Interventions    Readmission Risk Interventions No flowsheet data found.

## 2020-06-27 DIAGNOSIS — J189 Pneumonia, unspecified organism: Secondary | ICD-10-CM

## 2020-06-27 DIAGNOSIS — E440 Moderate protein-calorie malnutrition: Secondary | ICD-10-CM | POA: Insufficient documentation

## 2020-06-27 LAB — URINE CULTURE: Culture: >=100000 — AB

## 2020-06-27 LAB — GLUCOSE, CAPILLARY
Glucose-Capillary: 83 mg/dL (ref 70–99)
Glucose-Capillary: 189 mg/dL — ABNORMAL HIGH (ref 70–99)
Glucose-Capillary: 307 mg/dL — ABNORMAL HIGH (ref 70–99)
Glucose-Capillary: 232 mg/dL — ABNORMAL HIGH (ref 70–99)

## 2020-06-27 LAB — CULTURE, RESPIRATORY W GRAM STAIN
Culture: NORMAL
Gram Stain: NONE SEEN

## 2020-06-27 MED ORDER — ENSURE ENLIVE PO LIQD
237.0000 mL | Freq: Two times a day (BID) | ORAL | Status: DC
  Administered 2020-06-27 – 2020-06-29 (×4): 237 mL via ORAL

## 2020-06-27 MED ORDER — INSULIN ASPART 100 UNIT/ML ~~LOC~~ SOLN
0.0000 [IU] | Freq: Three times a day (TID) | SUBCUTANEOUS | Status: DC
  Administered 2020-06-28: 7 [IU] via SUBCUTANEOUS
  Administered 2020-06-28 – 2020-06-29 (×2): 1 [IU] via SUBCUTANEOUS
  Administered 2020-06-29: 2 [IU] via SUBCUTANEOUS

## 2020-06-27 MED ORDER — INSULIN ASPART 100 UNIT/ML ~~LOC~~ SOLN
0.0000 [IU] | Freq: Every day | SUBCUTANEOUS | Status: DC
  Administered 2020-06-27: 2 [IU] via SUBCUTANEOUS

## 2020-06-27 MED ORDER — BOOST / RESOURCE BREEZE PO LIQD CUSTOM
1.0000 | ORAL | Status: DC
  Administered 2020-06-27 – 2020-06-29 (×3): 1 via ORAL

## 2020-06-27 NOTE — Progress Notes (Signed)
Initial Nutrition Assessment  DOCUMENTATION CODES:   Non-severe (moderate) malnutrition in context of chronic illness  INTERVENTION:  - continue Ensure Enlive BID, each supplement provides 350 kcal and 20 grams of protein. - will order Boost Breeze once/day, each supplement provides 250 kcal and 9 grams of protein. - weigh patient today.   NUTRITION DIAGNOSIS:   Moderate Malnutrition related to chronic illness (possible dementia, possible Parkinson's) as evidenced by mild fat depletion,mild muscle depletion,moderate muscle depletion.  GOAL:   Patient will meet greater than or equal to 90% of their needs  MONITOR:   PO intake,Supplement acceptance,Labs,Weight trends  REASON FOR ASSESSMENT:   Malnutrition Screening Tool,Consult Assessment of nutrition requirement/status  ASSESSMENT:   70 year old male with medical history of DM, CKD, possibly early dementia, possible NPH, and possible early Parkinson's syndrome. He presented to the ED after being found on the floor by his wife. EMS was called and patient was found to be febrile and reported weakness for several days. In the ED, he was dx with sepsis, PNA, and UTI.  Patient consumed 75% of breakfast, 100% of lunch, and 100% of dinner yesterday (total of 1847 kcal and 62 grams protein). Visualized breakfast tray from this morning with 75% completion (682 kcal and 21 grams protein).  Ensure Enlive was ordered BID yesterday and patient has accepted all 3 bottles offered to him so far.  Patient reports that he lives with his wife. He enjoys doing the grocery shopping and cooking so these are activities that he takes care of for him and his wife. He has been feeling weak, but even previously had been using a walker to help him get around.   He denies any abdominal pain/pressure or nausea today or PTA. He denies any chewing or swallowing difficulties and makes sure to cut foods into pieces he can then adequately chew.   He reports  that his appetite has been decreased or even poor for at least several weeks. He conveys that he and his wife are making decisions about living arrangements as they continue to get older and that this has been difficult for both of them emotionally.   Weight on 2/6 was documented as 285 lb and appears to be a stated weight. This is an outlier compared to weight as far back as 09/08/12.   Weight on 05/01/20 was 167 lb and weight on 12/28/19 was 192 lb. This indicates 25 lb weight loss (13% body weight) in 4 months; significant for time frame.    Labs reviewed; CBG: 83 mg/dl Medications reviewed; 40 mg oral protonix/day.  IVF; NS @ 75 ml/hr.     NUTRITION - FOCUSED PHYSICAL EXAM:  Flowsheet Row Most Recent Value  Orbital Region Mild depletion  Upper Arm Region Moderate depletion  Thoracic and Lumbar Region Unable to assess  Buccal Region Mild depletion  Temple Region Mild depletion  Clavicle Bone Region Moderate depletion  Clavicle and Acromion Bone Region Moderate depletion  Scapular Bone Region Mild depletion  Dorsal Hand Mild depletion  Patellar Region Unable to assess  Anterior Thigh Region Unable to assess  Posterior Calf Region Unable to assess  Edema (RD Assessment) Unable to assess  Hair Reviewed  Eyes Reviewed  Mouth Reviewed  Skin Reviewed  Nails Reviewed       Diet Order:   Diet Order            Diet regular Room service appropriate? Yes; Fluid consistency: Thin  Diet effective now  EDUCATION NEEDS:   No education needs have been identified at this time  Skin:  Skin Assessment: Reviewed RN Assessment  Last BM:  2/5  Height:   Ht Readings from Last 1 Encounters:  06/25/20 6\' 5"  (1.956 m)    Weight:   Wt Readings from Last 1 Encounters:  06/25/20 129.3 kg    Estimated Nutritional Needs:  Kcal:  2350-2550 kcal Protein:  120-135 grams Fluid:  >/= 2.5 L/day     Ronald Matin, MS, RD, LDN, CNSC Inpatient Clinical  Dietitian RD pager # available in AMION  After hours/weekend pager # available in [facility]

## 2020-06-27 NOTE — Care Management Important Message (Signed)
Important Message  Patient Details IM Letter placed in Patient's room. Name: [name] MRN: [id number] Date of Birth: 10-29-1950   Medicare Important Message Given:  Yes     Kerin Salen 06/27/2020, 10:38 AM

## 2020-06-27 NOTE — Progress Notes (Signed)
PROGRESS NOTE  [name] ZOX:[id number] DOB: 1950/12/18 DOA: 06/24/2020 PCP: Jani Gravel, MD  Brief History   70 year old man PMH including diabetes mellitus, CKD, possibly early dementia, possible NPH, possible early parkinsonian syndrome presented with worsening weakness over several days, found on floor by wife, febrile per EMS. Admitted for sepsis secondary to pneumonia, UTI.  Condition rapidly improved, at this point pneumonia and UTI appear clinically resolved.  SNF recommended.    A & P  Sepsis secondary to pneumonia, UTI --Sepsis resolved.  Pneumonia appears clinically resolved at this point.  UTI appears resolved. --Complete antibiotics tomorrow.  Diabetes mellitus type 2 with hypoglycemia. Hold Metformin. --Fasting blood sugar good, gets hyperglycemic later in the day.  Can resume metformin on discharge.  Mild normocytic anemia --Stable.  Essential hypertension. --Resume ACE inhibitor on discharge  Barrett's esophagus, GERD --Continue PPI  Cognitive impairment. Neurology note from December 2021 reviewed. Concern for early Parkinsonism, NPH. Referred to neurosurgery for evaluation of temporary lumbar drain or shunting. --Follow-up as an outpatient --Was to have a drain placed next week, I have communicated with Dr. Zada Finders who is aware and office will reschedule --Follow-up with neurology as an outpatient  Fall at home, social. Per nursing, bedbugs discovered. In review of chart,/saw neurology in December at which time it was noted patient had been evicted. At that time suggestion was made for consideration of ALF. --SNF recommended, wife agrees  Moderate malnutrition --Management as per dietitian which includes Ensure and boost breeze   Disposition Plan:  Discussion: Appears to be improving.  Discontinue telemetry.  Continue antibiotics.  Therapy evaluations.  Status is: Inpatient  Remains inpatient appropriate because:IV treatments appropriate due to  intensity of illness or inability to take PO and Inpatient level of care appropriate due to severity of illness   Dispo: The patient is from: Home              Anticipated d/c is to: unclear              Anticipated d/c date is: 2 days              Patient currently is not medically stable to d/c.   Difficult to place patient No  DVT prophylaxis: enoxaparin (LOVENOX) injection 40 mg Start: 06/24/20 2200   Code Status: Full Code Level of care: Med-Surg Family Communication: updated wife by telephone last night and again 2/8  Murray Hodgkins, MD  [facility] Direct contact: see [email or web address] (further directions at bottom of note if needed) 7PM-7AM contact night coverage as at bottom of note 06/27/2020, 5:46 PM  LOS: 3 days     Micro Data:  . 2/5 UC Klebsiella . 2/5 BC 1/2 contaminant   Antimicrobials:  . 2/5 ceftriaxone >  . 2/5 azithromycin >  Interval History/Subjective  CC: f/u confusion  Feels better today  Objective   Vitals:  Vitals:   06/27/20 0616 06/27/20 1308  BP: 136/80 137/87  Pulse: 64 78  Resp: 18 18  Temp: 98 F (36.7 C) 99.5 F (37.5 C)  SpO2: 99% 99%    Exam:  Constitutional:   . Appears calm and comfortable ENMT:  . grossly normal hearing  Respiratory:  . CTA bilaterally, no w/r/r.  . Respiratory effort normal.  Cardiovascular:  . RRR, no m/r/g . No LE extremity edema   Musculoskeletal:  . RLE, LLE   . strength and tone grossly normal Psychiatric:  . Mental status o Mood, affect appropriate  I have personally reviewed  the following:   Today's Data  Fasting blood sugar 83.  Hyperglycemic later in the day.  Scheduled Meds: . azithromycin  250 mg Oral Daily  . enoxaparin (LOVENOX) injection  40 mg Subcutaneous Q24H  . feeding supplement  1 Container Oral Q24H  . feeding supplement  237 mL Oral BID BM  . pantoprazole  40 mg Oral Daily  . permethrin   Topical Once   Continuous Infusions: . cefTRIAXone (ROCEPHIN)  IV Stopped  (06/27/20 0018)    Principal Problem:   Sepsis due to undetermined organism [facility]) Active Problems:   Hypertension   Chronic kidney disease (CKD), stage III (moderate) (HCC)   Barrett esophagus   Mild protein malnutrition (HCC)   Type 2 diabetes mellitus with hyperglycemia ([place])   Lobar pneumonia ([place])   Fall at home, initial encounter   Cognitive impairment   Malnutrition of moderate degree   LOS: 3 days   How to contact the [facility] Attending or Consulting provider [place] or covering provider during after hours [place], for this patient?  1. Check the care team in [facility] and look for a) attending/consulting TRH provider listed and b) the [facility] team listed 2. Log into [email or web address] and use [place]'s universal password to access. If you do not have the password, please contact the hospital operator. 3. Locate the [facility] provider you are looking for under [facility] and page to a number that you can be directly reached. 4. If you still have difficulty reaching the provider, please page the [facility] (Director on Call) for the Hospitalists listed on amion for assistance.

## 2020-06-27 NOTE — NC FL2 (Signed)
[place] LEVEL OF CARE SCREENING TOOL     IDENTIFICATION  Patient Name: [name] Birthdate: 07-11-1950 Sex: male Admission Date (Current Location): 06/24/2020  [facility] and [place] Number:  Herbalist and Address:  [facility],  [place], [place]      Provider Number: [id number]  Attending Physician Name and Address:  Samuella Cota, MD  Relative Name and Phone Number:  wife, Thermon Zulauf @ [phone]    Current Level of Care: Hospital Recommended Level of Care: [place] Prior Approval Number:    Date Approved/Denied:   PASRR Number: [id number]  Discharge Plan: SNF    Current Diagnoses: Patient Active Problem List   Diagnosis Date Noted  . Type 2 diabetes mellitus with hyperglycemia ([place]) 06/25/2020  . Lobar pneumonia ([place]) 06/25/2020  . Fall at home, initial encounter 06/25/2020  . Cognitive impairment 06/25/2020  . Sepsis due to undetermined organism ([place]) 06/24/2020  . Barrett esophagus   . Mild protein malnutrition ([place])   . Diabetes mellitus ([place])   . Hypertension   . Chronic kidney disease (CKD), stage III (moderate) (HCC)     Orientation RESPIRATION BLADDER Height & Weight     Self,Time,Place  Normal Continent,Incontinent Weight: 285 lb (129.3 kg) Height:  6\' 5"  (195.6 cm)  BEHAVIORAL SYMPTOMS/MOOD NEUROLOGICAL BOWEL NUTRITION STATUS      Continent    AMBULATORY STATUS COMMUNICATION OF NEEDS Skin   Limited Assist Verbally Normal                       Personal Care Assistance Level of Assistance  Bathing,Dressing Bathing Assistance: Limited assistance   Dressing Assistance: Limited assistance     Functional Limitations Info             SPECIAL CARE FACTORS FREQUENCY  PT (By licensed PT),OT (By licensed OT)     PT Frequency: 5x/wk OT Frequency: 5x/wk            Contractures Contractures Info: Not present    Additional Factors Info  Code  Status,Allergies,Isolation Precautions Code Status Info: Full Allergies Info: NKDA           Current Medications (06/27/2020):  This is the current hospital active medication list Current Facility-Administered Medications  Medication Dose Route Frequency Provider Last Rate Last Admin  . 0.9 %  sodium chloride infusion   Intravenous Continuous Samuella Cota, MD 75 mL/hr at 06/27/20 1402 New Bag at 06/27/20 1402  . acetaminophen (TYLENOL) tablet 650 mg  650 mg Oral Q6H PRN Reubin Milan, MD       Or  . acetaminophen (TYLENOL) suppository 650 mg  650 mg Rectal Q6H PRN Reubin Milan, MD      . azithromycin [facility]) tablet 250 mg  250 mg Oral Daily Reubin Milan, MD   250 mg at 06/27/20 0850  . cefTRIAXone (ROCEPHIN) 2 g in sodium chloride 0.9 % 100 mL IVPB  2 g Intravenous Q24H Reubin Milan, MD   Stopped at 06/27/20 0018  . enoxaparin (LOVENOX) injection 40 mg  40 mg Subcutaneous Q24H Reubin Milan, MD   40 mg at 06/26/20 2331  . feeding supplement (BOOST / RESOURCE BREEZE) liquid 1 Container  1 Container Oral Q24H Samuella Cota, MD   1 Container at 06/27/20 1401  . feeding supplement (ENSURE ENLIVE / ENSURE PLUS) liquid 237 mL  237 mL Oral BID BM Samuella Cota,  MD      . ipratropium-albuterol (DUONEB) 0.5-2.5 (3) MG/3ML nebulizer solution 3 mL  3 mL Nebulization Q6H PRN Reubin Milan, MD      . ondansetron [facility]) tablet 4 mg  4 mg Oral Q6H PRN Reubin Milan, MD       Or  . ondansetron [facility]) injection 4 mg  4 mg Intravenous Q6H PRN Reubin Milan, MD      . pantoprazole (PROTONIX) EC tablet 40 mg  40 mg Oral Daily Reubin Milan, MD   40 mg at 06/27/20 0847  . permethrin (ELIMITE) 5 % cream   Topical Once Reubin Milan, MD         Discharge Medications: Please see discharge summary for a list of discharge medications.  Relevant Imaging Results:  Relevant Lab Results:   Additional Information SS#   [id number];  Pt is vaccinated and booster  Erice Ahles, LCSW

## 2020-06-27 NOTE — TOC Initial Note (Signed)
Transition of Care [name]) - Initial/Assessment Note    Patient Details  Name: [name] MRN: [id number] Date of Birth: 1951-03-14  Transition of Care [name]) CM/SW Contact:    Ronald Pall, LCSW Phone Number: 06/27/2020, 2:27 PM  Clinical Narrative:                 Met with pt and spoke with wife via phone to review PT recommendations for SNF.  Pt confirms that he and his wife are currently living in a local motel.  Wife is independent overall, however, both concerned about physical assistance he now needs.  They are agreeable to short term rehab.  Have begun insurance authorization and bed search.  Expected Discharge Plan: [name] Barriers to Discharge: Continued Medical Work up   Patient Goals and CMS Choice Patient states their goals for this hospitalization and ongoing recovery are:: To get better   Choice offered to / list presented to : Spouse  Expected Discharge Plan and Services Expected Discharge Plan: [name] In-house Referral: Clinical Social Work     Living arrangements for the past 2 months: Hotel/Motel ([name], [place])                 DME Arranged: N/A DME Agency: NA                  Prior Living Arrangements/Services Living arrangements for the past 2 months: Hotel/Motel ([name], [name]) Lives with:: Spouse Patient language and need for interpreter reviewed:: Yes Do you feel safe going back to the place where you live?: Yes      Need for Family Participation in Patient Care: Yes (Comment) Care giver support system in place?: Yes (comment)   Criminal Activity/Legal Involvement Pertinent to Current Situation/Hospitalization: No - Comment as needed  Activities of Daily Living Home Assistive Devices/Equipment: Cane (specify quad or straight),Walker (specify type) ADL Screening (condition at time of admission) Patient's cognitive ability adequate to safely complete daily activities?: No Is  the patient deaf or have difficulty hearing?: No Does the patient have difficulty seeing, even when wearing glasses/contacts?: Yes Does the patient have difficulty concentrating, remembering, or making decisions?: Yes Patient able to express need for assistance with ADLs?: Yes Does the patient have difficulty dressing or bathing?: Yes Independently performs ADLs?: No Communication: Needs assistance Dressing (OT): Needs assistance Is this a change from baseline?: Pre-admission baseline Grooming: Independent Feeding: Independent Bathing: Needs assistance Is this a change from baseline?: Pre-admission baseline Toileting: Needs assistance Is this a change from baseline?: Pre-admission baseline In/Out Bed: Needs assistance Is this a change from baseline?: Pre-admission baseline Walks in Home: Needs assistance Is this a change from baseline?: Pre-admission baseline Does the patient have difficulty walking or climbing stairs?: Yes Weakness of Legs: Both Weakness of Arms/Hands: Both  Permission Sought/Granted Permission sought to share information with : Family Supports Permission granted to share information with : Yes, Verbal Permission Granted  Share Information with NAME: [name]     Permission granted to share info w Relationship: wife  Permission granted to share info w Contact Information: [phone]  Emotional Assessment Appearance:: Appears stated age Attitude/Demeanor/Rapport: Engaged,Gracious Affect (typically observed): Accepting Orientation: : Oriented to Self,Oriented to Place,Oriented to  Time,Oriented to Situation Alcohol / Substance Use: Not Applicable Psych Involvement: No (comment)  Admission diagnosis:  Acute cystitis with hematuria [N30.01] Sepsis ([place]) [A41.9] Sepsis due to undetermined organism ([place]) [A41.9] Community acquired pneumonia, unspecified laterality [J18.9] Sepsis, due to unspecified  organism, unspecified whether acute organ dysfunction  present [facility]) [A41.9] Patient Active Problem List   Diagnosis Date Noted  . Type 2 diabetes mellitus with hyperglycemia ([place]) 06/25/2020  . Lobar pneumonia ([place]) 06/25/2020  . Fall at home, initial encounter 06/25/2020  . Cognitive impairment 06/25/2020  . Sepsis due to undetermined organism ([place]) 06/24/2020  . Barrett esophagus   . Mild protein malnutrition ([place])   . Diabetes mellitus ([place])   . Hypertension   . Chronic kidney disease (CKD), stage III (moderate) ([facility])    PCP:  Ronald Gravel, MD Pharmacy:   [name], [name] [place] [address] Phone: [phone] Fax: [phone]     Social Determinants of Health (SDOH) Interventions    Readmission Risk Interventions Readmission Risk Prevention Plan 06/27/2020  Post Dischage Appt Complete  Medication Screening Complete  Transportation Screening Complete  Some recent data might be hidden

## 2020-06-28 DIAGNOSIS — K219 Gastro-esophageal reflux disease without esophagitis: Secondary | ICD-10-CM

## 2020-06-28 DIAGNOSIS — N390 Urinary tract infection, site not specified: Secondary | ICD-10-CM

## 2020-06-28 LAB — GLUCOSE, CAPILLARY
Glucose-Capillary: 99 mg/dL (ref 70–99)
Glucose-Capillary: 341 mg/dL — ABNORMAL HIGH (ref 70–99)
Glucose-Capillary: 133 mg/dL — ABNORMAL HIGH (ref 70–99)
Glucose-Capillary: 146 mg/dL — ABNORMAL HIGH (ref 70–99)

## 2020-06-28 LAB — SARS CORONAVIRUS 2 (TAT 6-24 HRS): SARS Coronavirus 2: NEGATIVE

## 2020-06-28 MED ORDER — POLYETHYLENE GLYCOL 3350 17 G PO PACK
17.0000 g | PACK | Freq: Every day | ORAL | Status: DC
  Administered 2020-06-29: 17 g via ORAL
  Filled 2020-06-28: qty 1

## 2020-06-28 MED ORDER — SENNA 8.6 MG PO TABS
1.0000 | ORAL_TABLET | Freq: Every day | ORAL | Status: DC
  Administered 2020-06-29: 8.6 mg via ORAL
  Filled 2020-06-28: qty 1

## 2020-06-28 NOTE — TOC Progression Note (Addendum)
Transition of Care [facility]) - Progression Note    Patient Details  Name: [name] MRN: [id number] Date of Birth: March 18, 1951  Transition [facility]) CM/SW Contact  Lennart Pall, LCSW Phone Number: 06/28/2020, 11:32 AM  Clinical Narrative:    Have alerted MD/RN of facility concerns over reported bed bugs found on patient in the ED at admission.  Need to proceed with any treatment of person and room that is needed and be able to confirm no longer any evidence of bugs before any facility can truly offer bed.  Have alerted pt's wife that she needs to come and pick up his personal belongings and take home - she plans to do this today.    I do have insurance authorization (ref# [id number]).  Expected Discharge Plan: [place] Barriers to Discharge: Continued Medical Work up  Expected Discharge Plan and Services Expected Discharge Plan: [place] In-house Referral: Clinical Social Work     Living arrangements for the past 2 months: Hotel/Motel ([place], [place]) Expected Discharge Date: 06/28/20               DME Arranged: N/A DME Agency: NA                   Social Determinants of Health (SDOH) Interventions    Readmission Risk Interventions Readmission Risk Prevention Plan 06/27/2020  Post Dischage Appt Complete  Medication Screening Complete  Transportation Screening Complete  Some recent data might be hidden

## 2020-06-28 NOTE — Discharge Summary (Addendum)
Physician Discharge Summary  [name] SAY:[id number] DOB: December 25, 1950 DOA: 06/24/2020  PCP: Jani Gravel, MD  Admit date: 06/24/2020 Discharge date: 06/29/2020  Admitted From: Home Disposition:  SNF   Recommendations for Outpatient Follow-up:  1. Follow up with PCP in 1 week 2. Follow up with neurosurgery Dr. Venetia Constable 3. Follow-up with neurology Dr. Jaynee Eagles  Discharge Condition: Stable CODE STATUS: Full  Diet recommendation:  Diet Orders (From admission, onward)    Start     Ordered   06/28/20 0000  Diet - low sodium heart healthy        06/28/20 1024   06/25/20 1043  Diet regular Room service appropriate? Yes; Fluid consistency: Thin  Diet effective now       Question Answer Comment  Room service appropriate? Yes   Fluid consistency: Thin      06/25/20 1043          Brief/Interim Summary: From H&P per Dr. Olevia Bowens "HPI: [name] is a 70 y.o. male with medical history significant of Barrett's esophagus, esophageal cancer, GERD, gastroparesis, type II DM, stage III CKD, hypertension, history of MRSA infection who is coming to the emergency department via EMS after the patient has been having worsening weakness for the past 2 to 3 days associated with decreasing oral intake.  The patient's wife stated that the patient went down on the floor and was unable to assess with transfers due to lower extremity weakness.  EMS found that the patient was febrile with a temperature 101.4 F and they gave the patient 1000 mg of acetaminophen and a liter of normal saline in route to the emergency department.  The patient has had a similar episodes when he gets a urine tract infection.  He is oriented to name, he knows he is in the hospital, but think he is at [facility].  He is disoriented to time, date and partially to situation.  He is able to answer simple questions denies headache, chest pain, flank or abdominal pain.  ED Course: Initial vital signs were temperature 99.1 F, pulse 110,  respirations 18, BP 114/66 mmHg and O2 sat 99% on room air.  The patient's temperature subsequently increased to 100.4 F.  He was given 2000 mL of NS bolus, ceftriaxone 1 g IVPB x1 and azithromycin 500 mg p.o. x1 dose.  I added LR 1000 mL IV bolus and Lokelma 10 g p.o. x1.  Labwork: Urinalysis showed glucosuria more than 500 mg/dL with large hemoglobinuria moderate leukocyte esterase.  RBC were 6-10 WBC were more than 50 per hpf and there were rare bacteria.  Lactic acid was 3.4 and then one-point a mmol/L.  CBC showed a white count of 10.4 with 93% neutrophils, hemoglobin 9.6 g/dL and platelets 187.  Normal PT/INR/PTT.  SARS coronavirus PCR was negative.  Fecal occult blood negative.  Sodium is 130, potassium 6.0, chloride 98 and CO2 20 mmol/L.  Glucose 398, BUN 37, creatinine 1.69 and calcium 8.1 mg/dL (normalizes when corrected to albumin).  Total protein is 5.9 and albumin 3.1 g/dL.  AST was 76 units/L.  Total bilirubin was 1.7 mg/dL.  ALT and alkaline phosphatase were normal.  Addendum to labs: CMP showed a potassium level of 2.3 mmol/, calcium of 5.9 mg/dL, different values for renal function and LFTs.  I-STAT Chem-8 and BMP showed that these results were inaccurate.  Imaging: A one-view portable chest radiograph show small left effusion with patchy opacities at the left lung base suspicious for pneumonia.  Please see  image and full radiology report for further detail."  Patient was treated with antibiotics with improvement in sepsis and mentation.  He was evaluated by physical therapy team who recommended SNF placement.  Discharge Diagnoses:  Principal Problem:   Sepsis secondary to UTI [facility]) Active Problems:   Hypertension   Chronic kidney disease (CKD), stage III (moderate) (HCC)   Barrett esophagus   Mild protein malnutrition (HCC)   Type 2 diabetes mellitus with hyperglycemia ([place])   Lobar pneumonia ([place])   Fall at home, initial encounter   Cognitive impairment   Malnutrition of  moderate degree   GERD (gastroesophageal reflux disease)    Discharge Instructions  Discharge Instructions    Diet - low sodium heart healthy   Complete by: As directed    Increase activity slowly   Complete by: As directed      Allergies as of 06/28/2020      Reactions   Duragen [estradiol Valerate] Other (See Comments)   unknown   Fentanyl Other (See Comments)   hallucinations   Morphine And Related Other (See Comments)   Hallucinations.      Medication List    TAKE these medications   esomeprazole 40 MG capsule Commonly known as: NEXIUM Take 40 mg by mouth daily before breakfast.   Fusion Plus Caps Take 1 capsule by mouth daily.   glimepiride 4 MG tablet Commonly known as: AMARYL Take 4 mg by mouth daily.   metFORMIN 1000 MG tablet Commonly known as: GLUCOPHAGE Take 1,000 mg by mouth 2 (two) times daily with a meal.   multivitamin with minerals Tabs tablet Take 1 tablet by mouth daily.   quinapril-hydrochlorothiazide 10-12.5 MG tablet Commonly known as: ACCURETIC Take 1 tablet by mouth daily.       Follow-up Information    Jani Gravel, MD. Schedule an appointment as soon as possible for a visit in 1 week(s).   Specialty: Internal Medicine Contact information: [place] [address] [phone]        Judith Part, MD. Schedule an appointment as soon as possible for a visit.   Specialty: Neurosurgery Contact information: [place] [address] [phone]        Melvenia Beam, MD Follow up.   Specialty: Neurology Contact information: [address] [place] [address] [phone]              Allergies  Allergen Reactions  . Duragen [Estradiol Valerate] Other (See Comments)    unknown  . Fentanyl Other (See Comments)    hallucinations  . Morphine And Related Other (See Comments)    Hallucinations.    Consultations:  None    Procedures/Studies: DG Chest  Port 1 View  Result Date: 06/24/2020 CLINICAL DATA:  Questionable sepsis EXAM: PORTABLE CHEST 1 VIEW COMPARISON:  06/03/2007 FINDINGS: Heart is normal size. Possible small left effusion. Patchy opacities in the left lower lung. Right lung clear. No acute bony abnormality. IMPRESSION: Suspect small left effusion with patchy opacities at the left lung base. This could reflect pneumonia. Electronically Signed   By: Rolm Baptise M.D.   On: 06/24/2020 19:14       Discharge Exam: Vitals:   06/27/20 2157 06/28/20 0411  BP: 117/74 115/74  Pulse: 74 62  Resp: 15 16  Temp: 99 F (37.2 C) 98.7 F (37.1 C)  SpO2: 99% 100%    General: Pt is alert, awake, not in acute distress Cardiovascular: RRR, S1/S2 +, no  edema Respiratory: CTA bilaterally, no wheezing, no rhonchi, no respiratory distress, no conversational dyspnea, on room air  Abdominal: Soft, NT, ND, bowel sounds + Extremities: no edema, no cyanosis Psych: Normal mood and affect, stable judgement and insight     The results of significant diagnostics from this hospitalization (including imaging, microbiology, ancillary and laboratory) are listed below for reference.     Microbiology: Recent Results (from the past 240 hour(s))  Blood Culture (routine x 2)     Status: Abnormal (Preliminary result)   Collection Time: 06/24/20  6:10 PM   Specimen: BLOOD  Result Value Ref Range Status   Specimen Description   Final    BLOOD RIGHT ARM Performed at [place] [address], [place], [place] [address]    Special Requests   Final    BOTTLES DRAWN AEROBIC AND ANAEROBIC Blood Culture adequate volume Performed at [place] [address], [place], [place] [address]    Culture  Setup Time   Final    GRAM POSITIVE COCCI IN BOTH AEROBIC AND ANAEROBIC BOTTLES CRITICAL RESULT CALLED TO, READ BACK BY AND VERIFIED WITH: PHARMD MICHELLE LILLISTON BY MESSAN H. AT 2355 ON 06/25/2020    Culture (A)   Final    STAPHYLOCOCCUS HOMINIS THE SIGNIFICANCE OF ISOLATING THIS ORGANISM FROM A SINGLE SET OF BLOOD CULTURES WHEN MULTIPLE SETS ARE DRAWN IS UNCERTAIN. PLEASE NOTIFY THE MICROBIOLOGY DEPARTMENT WITHIN ONE WEEK IF SPECIATION AND SENSITIVITIES ARE REQUIRED. CULTURE REINCUBATED FOR BETTER GROWTH Performed at [facility], [place] [address] [place], [place] [address]    Report Status PENDING  Incomplete  SARS Coronavirus 2 by RT PCR (hospital order, performed in [facility] hospital lab) Nasopharyngeal Nasopharyngeal Swab     Status: None   Collection Time: 06/24/20  6:10 PM   Specimen: Nasopharyngeal Swab  Result Value Ref Range Status   SARS Coronavirus 2 NEGATIVE NEGATIVE Final    Comment: (NOTE) SARS-CoV-2 target nucleic acids are NOT DETECTED.  The SARS-CoV-2 RNA is generally detectable in upper and lower respiratory specimens during the acute phase of infection. The lowest concentration of SARS-CoV-2 viral copies this assay can detect is 250 copies / mL. A negative result does not preclude SARS-CoV-2 infection and should not be used as the sole basis for treatment or other patient management decisions.  A negative result may occur with improper specimen collection / handling, submission of specimen other than nasopharyngeal swab, presence of viral mutation(s) within the areas targeted by this assay, and inadequate number of viral copies (<250 copies / mL). A negative result must be combined with clinical observations, patient history, and epidemiological information.  Fact Sheet for Patients:   StrictlyIdeas.no  Fact Sheet for Healthcare Providers: BankingDealers.co.za  This test is not yet approved or  cleared by the [place] FDA and has been authorized for detection and/or diagnosis of SARS-CoV-2 by FDA under an Emergency Use Authorization (EUA).  This EUA will remain in effect (meaning this test can be used) for the  duration of the COVID-19 declaration under Section 564(b)(1) of the Act, 21 U.S.C. section 360bbb-3(b)(1), unless the authorization is terminated or revoked sooner.  Performed at [facility], [place] [address] [place],  [address]   Blood Culture ID Panel (Reflexed)     Status: Abnormal   Collection Time: 06/24/20  6:10 PM  Result Value Ref Range Status   Enterococcus faecalis NOT DETECTED NOT DETECTED Final   Enterococcus Faecium NOT DETECTED NOT  DETECTED Final   Listeria monocytogenes NOT DETECTED NOT DETECTED Final   Staphylococcus species DETECTED (A) NOT DETECTED Final    Comment: CRITICAL RESULT CALLED TO, READ BACK BY AND VERIFIED WITH: PHARMD MICHELLE LILLISTON BY MESSAN H. AT 2355 ON 06/25/2020    Staphylococcus aureus (BCID) NOT DETECTED NOT DETECTED Final   Staphylococcus epidermidis NOT DETECTED NOT DETECTED Final   Staphylococcus lugdunensis NOT DETECTED NOT DETECTED Final   Streptococcus species NOT DETECTED NOT DETECTED Final   Streptococcus agalactiae NOT DETECTED NOT DETECTED Final   Streptococcus pneumoniae NOT DETECTED NOT DETECTED Final   Streptococcus pyogenes NOT DETECTED NOT DETECTED Final   A.calcoaceticus-baumannii NOT DETECTED NOT DETECTED Final   Bacteroides fragilis NOT DETECTED NOT DETECTED Final   Enterobacterales NOT DETECTED NOT DETECTED Final   Enterobacter cloacae complex NOT DETECTED NOT DETECTED Final   Escherichia coli NOT DETECTED NOT DETECTED Final   Klebsiella aerogenes NOT DETECTED NOT DETECTED Final   Klebsiella oxytoca NOT DETECTED NOT DETECTED Final   Klebsiella pneumoniae NOT DETECTED NOT DETECTED Final   Proteus species NOT DETECTED NOT DETECTED Final   Salmonella species NOT DETECTED NOT DETECTED Final   Serratia marcescens NOT DETECTED NOT DETECTED Final   Haemophilus influenzae NOT DETECTED NOT DETECTED Final   Neisseria meningitidis NOT DETECTED NOT DETECTED Final   Pseudomonas aeruginosa NOT DETECTED NOT  DETECTED Final   Stenotrophomonas maltophilia NOT DETECTED NOT DETECTED Final   Candida albicans NOT DETECTED NOT DETECTED Final   Candida auris NOT DETECTED NOT DETECTED Final   Candida glabrata NOT DETECTED NOT DETECTED Final   Candida krusei NOT DETECTED NOT DETECTED Final   Candida parapsilosis NOT DETECTED NOT DETECTED Final   Candida tropicalis NOT DETECTED NOT DETECTED Final   Cryptococcus neoformans/gattii NOT DETECTED NOT DETECTED Final    Comment: Performed at [facility], [address] [place], [place] [address]  Blood Culture (routine x 2)     Status: None (Preliminary result)   Collection Time: 06/24/20  6:15 PM   Specimen: BLOOD  Result Value Ref Range Status   Specimen Description   Final    BLOOD SITE NOT SPECIFIED Performed at [place] [address], [place], [place] [address]    Special Requests   Final    BOTTLES DRAWN AEROBIC AND ANAEROBIC Blood Culture adequate volume Performed at [place] [address] [place], [place] [address]    Culture   Final    NO GROWTH 3 DAYS Performed at [facility], [place] [address] [place], [place] [address]    Report Status PENDING  Incomplete  Urine culture     Status: Abnormal   Collection Time: 06/24/20  7:55 PM   Specimen: In/Out Cath Urine  Result Value Ref Range Status   Specimen Description   Final    IN/OUT CATH URINE Performed at [place] [address] [place], [place] [address]    Special Requests   Final    NONE Performed at [facility], [place] [address], [place], [place] [address]    Culture >=100,000 COLONIES/mL KLEBSIELLA PNEUMONIAE (A)  Final   Report Status 06/27/2020 FINAL  Final   Organism ID, Bacteria KLEBSIELLA PNEUMONIAE (A)  Final      Susceptibility   Klebsiella pneumoniae - MIC*    AMPICILLIN >=32 RESISTANT Resistant     CEFAZOLIN <=4 SENSITIVE Sensitive     CEFEPIME <=0.12 SENSITIVE Sensitive  CEFTRIAXONE <=0.25 SENSITIVE Sensitive     CIPROFLOXACIN <=0.25 SENSITIVE Sensitive     GENTAMICIN <=1 SENSITIVE Sensitive     IMIPENEM <=0.25 SENSITIVE Sensitive     NITROFURANTOIN 64 INTERMEDIATE Intermediate     TRIMETH/SULFA <=20 SENSITIVE Sensitive     AMPICILLIN/SULBACTAM >=32 RESISTANT Resistant     PIP/TAZO 32 INTERMEDIATE Intermediate     * >=100,000 COLONIES/mL KLEBSIELLA PNEUMONIAE  Expectorated sputum assessment w rflx to resp cult     Status: None   Collection Time: 06/25/20  2:24 AM   Specimen: Sputum  Result Value Ref Range Status   Specimen Description SPUTUM  Final   Special Requests Immunocompromised  Final   Sputum evaluation   Final    THIS SPECIMEN IS ACCEPTABLE FOR SPUTUM CULTURE Performed at [place] [address], [place], [place] [address]    Report Status 06/25/2020 FINAL  Final  Culture, respiratory     Status: None   Collection Time: 06/25/20  2:24 AM   Specimen: SPU  Result Value Ref Range Status   Specimen Description   Final    SPUTUM Performed at [place] [address], [place], [place] [address]    Special Requests   Final    Immunocompromised Reflexed from [phone] Performed at [facility], [place] [address], [place], [place] [address]    Gram Stain   Final    NO WBC SEEN FEW SQUAMOUS EPITHELIAL CELLS PRESENT MODERATE GRAM POSITIVE COCCI IN PAIRS IN CHAINS MODERATE GRAM VARIABLE ROD RARE BUDDING YEAST SEEN    Culture   Final    Normal respiratory flora-no Staph aureus or Pseudomonas seen Performed at [place] [address] [place], [place] [address]    Report Status 06/27/2020 FINAL  Final  SARS CORONAVIRUS 2 (TAT 6-24 HRS) Nasopharyngeal Nasopharyngeal Swab     Status: None   Collection Time: 06/25/20 11:28 AM   Specimen: Nasopharyngeal Swab  Result Value Ref Range Status   SARS Coronavirus 2 NEGATIVE NEGATIVE Final    Comment: (NOTE) SARS-CoV-2 target  nucleic acids are NOT DETECTED.  The SARS-CoV-2 RNA is generally detectable in upper and lower respiratory specimens during the acute phase of infection. Negative results do not preclude SARS-CoV-2 infection, do not rule out co-infections with other pathogens, and should not be used as the sole basis for treatment or other patient management decisions. Negative results must be combined with clinical observations, patient history, and epidemiological information. The expected result is Negative.  Fact Sheet for Patients: SugarRoll.be  Fact Sheet for Healthcare Providers: https://www.woods-mathews.com/  This test is not yet approved or cleared by the [place] FDA and  has been authorized for detection and/or diagnosis of SARS-CoV-2 by FDA under an Emergency Use Authorization (EUA). This EUA will remain  in effect (meaning this test can be used) for the duration of the COVID-19 declaration under Se ction 564(b)(1) of the Act, 21 U.S.C. section 360bbb-3(b)(1), unless the authorization is terminated or revoked sooner.  Performed at [facility], [place] [address], [place], [place] [address]      Labs: BNP (last 3 results) No results for input(s): BNP in the last 8760 hours. Basic Metabolic Panel: Recent Labs  Lab 06/24/20 1809 06/24/20 2133 06/24/20 2312 06/25/20 0005 06/25/20 0430 06/26/20 0420  NA 130* 140 136 138 139 137  K 6.0* 2.3* 3.6 3.5 3.5 4.1  CL 98 116* 104 103 100 103  CO2 20* 17* 21*  --  25 24  GLUCOSE 398* 176* 188* 185* 93 92  BUN 37* 24* 30* 33* 28* 23  CREATININE 1.69* 0.86 1.20 1.10 1.28* 1.13  CALCIUM 8.1* 5.5* 7.9*  --  9.0 8.3*  MG  --   --   --   --   --  1.5*  PHOS  --   --   --   --   --  2.2*   Liver Function Tests: Recent Labs  Lab 06/24/20 1809 06/24/20 2133 06/26/20 0420  AST 76* 26 47*  ALT 25 13 24   ALKPHOS 76 48 60  BILITOT 1.7* 0.5 0.5  PROT 5.9* 3.8* 5.4*  ALBUMIN 3.1* 1.9*  2.8*   No results for input(s): LIPASE, AMYLASE in the last 168 hours. No results for input(s): AMMONIA in the last 168 hours. CBC: Recent Labs  Lab 06/24/20 1809 06/25/20 0005 06/25/20 0430 06/26/20 0420  WBC 10.4  --  14.5* 8.5  NEUTROABS 9.7*  --  11.0*  --   HGB 9.6* 10.2* 13.1 10.8*  HCT 28.6* 30.0* 40.3 33.4*  MCV 92.9  --  92.9 94.9  PLT 187  --  258 216   Cardiac Enzymes: No results for input(s): CKTOTAL, CKMB, CKMBINDEX, TROPONINI in the last 168 hours. BNP: Invalid input(s): POCBNP CBG: Recent Labs  Lab 06/27/20 0702 06/27/20 1212 06/27/20 1707 06/27/20 2247 06/28/20 0527  GLUCAP 83 189* 307* 232* 99   D-Dimer No results for input(s): DDIMER in the last 72 hours. Hgb A1c No results for input(s): HGBA1C in the last 72 hours. Lipid Profile No results for input(s): CHOL, HDL, LDLCALC, TRIG, CHOLHDL, LDLDIRECT in the last 72 hours. Thyroid function studies No results for input(s): TSH, T4TOTAL, T3FREE, THYROIDAB in the last 72 hours.  Invalid input(s): FREET3 Anemia work up No results for input(s): VITAMINB12, FOLATE, FERRITIN, TIBC, IRON, RETICCTPCT in the last 72 hours. Urinalysis    Component Value Date/Time   COLORURINE YELLOW 06/24/2020 1955   APPEARANCEUR CLEAR 06/24/2020 1955   LABSPEC 1.016 06/24/2020 1955   PHURINE 5.0 06/24/2020 1955   GLUCOSEU >=500 (A) 06/24/2020 1955   HGBUR LARGE (A) 06/24/2020 [place] NEGATIVE 06/24/2020 1955   KETONESUR NEGATIVE 06/24/2020 1955   PROTEINUR NEGATIVE 06/24/2020 1955   UROBILINOGEN 1.0 08/27/2007 1745   NITRITE NEGATIVE 06/24/2020 1955   LEUKOCYTESUR MODERATE (A) 06/24/2020 1955   Sepsis Labs Invalid input(s): PROCALCITONIN,  WBC,  LACTICIDVEN Microbiology Recent Results (from the past 240 hour(s))  Blood Culture (routine x 2)     Status: Abnormal (Preliminary result)   Collection Time: 06/24/20  6:10 PM   Specimen: BLOOD  Result Value Ref Range Status   Specimen Description   Final     BLOOD RIGHT ARM Performed at [facility], [place] [address] [place], [place] [address]    Special Requests   Final    BOTTLES DRAWN AEROBIC AND ANAEROBIC Blood Culture adequate volume Performed at [place] [address] [place], [place] [address]    Culture  Setup Time   Final    GRAM POSITIVE COCCI IN BOTH AEROBIC AND ANAEROBIC BOTTLES CRITICAL RESULT CALLED TO, READ BACK BY AND VERIFIED WITH: PHARMD MICHELLE LILLISTON BY MESSAN H. AT 2355 ON 06/25/2020    Culture (A)  Final    STAPHYLOCOCCUS HOMINIS THE SIGNIFICANCE OF ISOLATING THIS ORGANISM FROM A SINGLE SET OF BLOOD CULTURES WHEN MULTIPLE SETS ARE DRAWN IS UNCERTAIN. PLEASE NOTIFY THE MICROBIOLOGY DEPARTMENT WITHIN ONE WEEK IF SPECIATION  AND SENSITIVITIES ARE REQUIRED. CULTURE REINCUBATED FOR BETTER GROWTH Performed at [facility], [place] [address], [place], [place] [address]    Report Status PENDING  Incomplete  SARS Coronavirus 2 by RT PCR (hospital order, performed in [facility] hospital lab) Nasopharyngeal Nasopharyngeal Swab     Status: None   Collection Time: 06/24/20  6:10 PM   Specimen: Nasopharyngeal Swab  Result Value Ref Range Status   SARS Coronavirus 2 NEGATIVE NEGATIVE Final    Comment: (NOTE) SARS-CoV-2 target nucleic acids are NOT DETECTED.  The SARS-CoV-2 RNA is generally detectable in upper and lower respiratory specimens during the acute phase of infection. The lowest concentration of SARS-CoV-2 viral copies this assay can detect is 250 copies / mL. A negative result does not preclude SARS-CoV-2 infection and should not be used as the sole basis for treatment or other patient management decisions.  A negative result may occur with improper specimen collection / handling, submission of specimen other than nasopharyngeal swab, presence of viral mutation(s) within the areas targeted by this assay, and inadequate number of viral copies (<250 copies / mL). A  negative result must be combined with clinical observations, patient history, and epidemiological information.  Fact Sheet for Patients:   StrictlyIdeas.no  Fact Sheet for Healthcare Providers: BankingDealers.co.za  This test is not yet approved or  cleared by the [place] FDA and has been authorized for detection and/or diagnosis of SARS-CoV-2 by FDA under an Emergency Use Authorization (EUA).  This EUA will remain in effect (meaning this test can be used) for the duration of the COVID-19 declaration under Section 564(b)(1) of the Act, 21 U.S.C. section 360bbb-3(b)(1), unless the authorization is terminated or revoked sooner.  Performed at [facility], [place] [address], [place], [place] [address]   Blood Culture ID Panel (Reflexed)     Status: Abnormal   Collection Time: 06/24/20  6:10 PM  Result Value Ref Range Status   Enterococcus faecalis NOT DETECTED NOT DETECTED Final   Enterococcus Faecium NOT DETECTED NOT DETECTED Final   Listeria monocytogenes NOT DETECTED NOT DETECTED Final   Staphylococcus species DETECTED (A) NOT DETECTED Final    Comment: CRITICAL RESULT CALLED TO, READ BACK BY AND VERIFIED WITH: PHARMD MICHELLE LILLISTON BY MESSAN H. AT 2355 ON 06/25/2020    Staphylococcus aureus (BCID) NOT DETECTED NOT DETECTED Final   Staphylococcus epidermidis NOT DETECTED NOT DETECTED Final   Staphylococcus lugdunensis NOT DETECTED NOT DETECTED Final   Streptococcus species NOT DETECTED NOT DETECTED Final   Streptococcus agalactiae NOT DETECTED NOT DETECTED Final   Streptococcus pneumoniae NOT DETECTED NOT DETECTED Final   Streptococcus pyogenes NOT DETECTED NOT DETECTED Final   A.calcoaceticus-baumannii NOT DETECTED NOT DETECTED Final   Bacteroides fragilis NOT DETECTED NOT DETECTED Final   Enterobacterales NOT DETECTED NOT DETECTED Final   Enterobacter cloacae complex NOT DETECTED NOT DETECTED Final    Escherichia coli NOT DETECTED NOT DETECTED Final   Klebsiella aerogenes NOT DETECTED NOT DETECTED Final   Klebsiella oxytoca NOT DETECTED NOT DETECTED Final   Klebsiella pneumoniae NOT DETECTED NOT DETECTED Final   Proteus species NOT DETECTED NOT DETECTED Final   Salmonella species NOT DETECTED NOT DETECTED Final   Serratia marcescens NOT DETECTED NOT DETECTED Final   Haemophilus influenzae NOT DETECTED NOT DETECTED Final   Neisseria meningitidis NOT DETECTED NOT DETECTED Final   Pseudomonas aeruginosa NOT DETECTED NOT DETECTED Final   Stenotrophomonas maltophilia NOT DETECTED NOT DETECTED Final   Candida albicans NOT DETECTED  NOT DETECTED Final   Candida auris NOT DETECTED NOT DETECTED Final   Candida glabrata NOT DETECTED NOT DETECTED Final   Candida krusei NOT DETECTED NOT DETECTED Final   Candida parapsilosis NOT DETECTED NOT DETECTED Final   Candida tropicalis NOT DETECTED NOT DETECTED Final   Cryptococcus neoformans/gattii NOT DETECTED NOT DETECTED Final    Comment: Performed at [facility], [place] [address] [place], [place] [address]  Blood Culture (routine x 2)     Status: None (Preliminary result)   Collection Time: 06/24/20  6:15 PM   Specimen: BLOOD  Result Value Ref Range Status   Specimen Description   Final    BLOOD SITE NOT SPECIFIED Performed at [facility] [address] [place], [place] [address]    Special Requests   Final    BOTTLES DRAWN AEROBIC AND ANAEROBIC Blood Culture adequate volume Performed at [facility] [address] [place], [place] [address]    Culture   Final    NO GROWTH 3 DAYS Performed at [facility], [place] [address] [place], [place] [address]    Report Status PENDING  Incomplete  Urine culture     Status: Abnormal   Collection Time: 06/24/20  7:55 PM   Specimen: In/Out Cath Urine  Result Value Ref Range Status   Specimen Description   Final    IN/OUT CATH URINE Performed at [facility] [address], [place], [place] [address]    Special Requests   Final    NONE Performed at [facility], [place] [address] [place], [place] [address]    Culture >=100,000 COLONIES/mL KLEBSIELLA PNEUMONIAE (A)  Final   Report Status 06/27/2020 FINAL  Final   Organism ID, Bacteria KLEBSIELLA PNEUMONIAE (A)  Final      Susceptibility   Klebsiella pneumoniae - MIC*    AMPICILLIN >=32 RESISTANT Resistant     CEFAZOLIN <=4 SENSITIVE Sensitive     CEFEPIME <=0.12 SENSITIVE Sensitive     CEFTRIAXONE <=0.25 SENSITIVE Sensitive     CIPROFLOXACIN <=0.25 SENSITIVE Sensitive     GENTAMICIN <=1 SENSITIVE Sensitive     IMIPENEM <=0.25 SENSITIVE Sensitive     NITROFURANTOIN 64 INTERMEDIATE Intermediate     TRIMETH/SULFA <=20 SENSITIVE Sensitive     AMPICILLIN/SULBACTAM >=32 RESISTANT Resistant     PIP/TAZO 32 INTERMEDIATE Intermediate     * >=100,000 COLONIES/mL KLEBSIELLA PNEUMONIAE  Expectorated sputum assessment w rflx to resp cult     Status: None   Collection Time: 06/25/20  2:24 AM   Specimen: Sputum  Result Value Ref Range Status   Specimen Description SPUTUM  Final   Special Requests Immunocompromised  Final   Sputum evaluation   Final    THIS SPECIMEN IS ACCEPTABLE FOR SPUTUM CULTURE Performed at [facility] [address], [place], [place] [address]    Report Status 06/25/2020 FINAL  Final  Culture, respiratory     Status: None   Collection Time: 06/25/20  2:24 AM   Specimen: SPU  Result Value Ref Range Status   Specimen Description   Final    SPUTUM Performed at [facility] [address] [place], [place] [address]    Special Requests   Final    Immunocompromised Reflexed from [id number] Performed at [facility], [place] [address] [place], [place] [address]    Gram Stain   Final    NO WBC SEEN FEW SQUAMOUS  EPITHELIAL CELLS PRESENT MODERATE GRAM POSITIVE COCCI IN PAIRS IN  CHAINS MODERATE GRAM VARIABLE ROD RARE BUDDING YEAST SEEN    Culture   Final    Normal respiratory flora-no Staph aureus or Pseudomonas seen Performed at [facility], [place] [address] [place], [place] [address]    Report Status 06/27/2020 FINAL  Final  SARS CORONAVIRUS 2 (TAT 6-24 HRS) Nasopharyngeal Nasopharyngeal Swab     Status: None   Collection Time: 06/25/20 11:28 AM   Specimen: Nasopharyngeal Swab  Result Value Ref Range Status   SARS Coronavirus 2 NEGATIVE NEGATIVE Final    Comment: (NOTE) SARS-CoV-2 target nucleic acids are NOT DETECTED.  The SARS-CoV-2 RNA is generally detectable in upper and lower respiratory specimens during the acute phase of infection. Negative results do not preclude SARS-CoV-2 infection, do not rule out co-infections with other pathogens, and should not be used as the sole basis for treatment or other patient management decisions. Negative results must be combined with clinical observations, patient history, and epidemiological information. The expected result is Negative.  Fact Sheet for Patients: SugarRoll.be  Fact Sheet for Healthcare Providers: https://www.woods-mathews.com/  This test is not yet approved or cleared by the [place] FDA and  has been authorized for detection and/or diagnosis of SARS-CoV-2 by FDA under an Emergency Use Authorization (EUA). This EUA will remain  in effect (meaning this test can be used) for the duration of the COVID-19 declaration under Se ction 564(b)(1) of the Act, 21 U.S.C. section 360bbb-3(b)(1), unless the authorization is terminated or revoked sooner.  Performed at [facility], [place] [address] [place], [place] [address]      Patient was seen and examined on the day of discharge and was found to be in stable condition. Time coordinating discharge: 35 minutes including assessment and coordination of care, as well as examination of the patient.    SIGNED:  Dessa Phi, DO [facility] 06/28/2020, 10:26 AM

## 2020-06-28 NOTE — Progress Notes (Signed)
Physical Therapy Treatment Patient Details Name: [name] MRN: [id number] DOB: [birth year] Today's Date: 06/28/2020    History of Present Illness Patient is a 70 year old man PMH including DM, CKD, possibly early dementia, possible NPH, possible early parkinsonian syndrome presented with worsening weakness over several days, found on floor by wife, febrile per EMS. Admitted for sepsis secondary to pneumonia, UTI.    PT Comments    Pt very pleasant and agreeable to mobilize.  Pt assisted with ambulating in hallway with RW and requiring min assist for mobility at this time.  Pt presents a high fall risk.  Continue to recommend SNF upon d/c.   Follow Up Recommendations  SNF     Equipment Recommendations  None recommended by PT    Recommendations for Other Services       Precautions / Restrictions Precautions Precautions: Fall    Mobility  Bed Mobility Overal bed mobility: Needs Assistance Bed Mobility: Supine to Sit     Supine to sit: Supervision        Transfers Overall transfer level: Needs assistance Equipment used: Rolling walker (2 wheeled) Transfers: Sit to/from Stand Sit to Stand: Min assist         General transfer comment: assist for steadying upon rise and controlling descent  Ambulation/Gait Ambulation/Gait assistance: Min assist Gait Distance (Feet): 160 Feet Assistive device: Rolling walker (2 wheeled) Gait Pattern/deviations: Step-through pattern;Decreased stride length;Narrow base of support;Drifts right/left Gait velocity: decr   General Gait Details: slow short steps, assist for manuevering RW as pt consistently drifts to right despite cues to correct   Stairs             Wheelchair Mobility    Modified Rankin (Stroke Patients Only)       Balance Overall balance assessment: Needs assistance         Standing balance support: Bilateral upper extremity supported Standing balance-Leahy Scale: Poor                               Cognition Arousal/Alertness: Awake/alert Behavior During Therapy: WFL for tasks assessed/performed                                   General Comments: orientated to person and place; poor safety awareness      Exercises      General Comments        Pertinent Vitals/Pain Pain Assessment: No/denies pain    Home Living                      Prior Function            PT Goals (current goals can now be found in the care plan section) Progress towards PT goals: Progressing toward goals    Frequency    Min 2X/week      PT Plan Current plan remains appropriate    Co-evaluation              AM-PAC PT "6 Clicks" Mobility   Outcome Measure  Help needed turning from your back to your side while in a flat bed without using bedrails?: A Little Help needed moving from lying on your back to sitting on the side of a flat bed without using bedrails?: A Little Help needed moving to and from a bed to a chair (including a wheelchair)?: A Little  Help needed standing up from a chair using your arms (Mccarthy.g., wheelchair or bedside chair)?: A Little Help needed to walk in hospital room?: A Little Help needed climbing 3-5 steps with a railing? : A Lot 6 Click Score: 17    End of Session Equipment Utilized During Treatment: Gait belt Activity Tolerance: Patient tolerated treatment well Patient left: in chair;with call bell/phone within reach;with chair alarm set Nurse Communication: Mobility status PT Visit Diagnosis: Other abnormalities of gait and mobility (R26.89)     Time: 9833-8250 PT Time Calculation (min) (ACUTE ONLY): 20 min  Jannette Spanner PT, DPT Acute Rehabilitation Services Pager: [phone] Office: [phone]  [name] 06/28/2020, 2:44 PM

## 2020-06-28 NOTE — Progress Notes (Signed)
Patient was given a shower by NT, hair and body washed. After shower patient was Moved from room 1332 to room 1327. EVS made aware that the patient in 1332 had had bed bugs in the ED. No bed bugs were seen by this nurse or any staff during yesterdays shift or todays shift.

## 2020-06-29 ENCOUNTER — Other Ambulatory Visit (HOSPITAL_COMMUNITY): Payer: Medicare PPO

## 2020-06-29 DIAGNOSIS — A419 Sepsis, unspecified organism: Secondary | ICD-10-CM | POA: Diagnosis not present

## 2020-06-29 DIAGNOSIS — R5381 Other malaise: Secondary | ICD-10-CM | POA: Diagnosis not present

## 2020-06-29 DIAGNOSIS — K2270 Barrett's esophagus without dysplasia: Secondary | ICD-10-CM | POA: Diagnosis not present

## 2020-06-29 DIAGNOSIS — M6281 Muscle weakness (generalized): Secondary | ICD-10-CM | POA: Diagnosis not present

## 2020-06-29 DIAGNOSIS — K3184 Gastroparesis: Secondary | ICD-10-CM | POA: Diagnosis not present

## 2020-06-29 DIAGNOSIS — R4189 Other symptoms and signs involving cognitive functions and awareness: Secondary | ICD-10-CM | POA: Diagnosis not present

## 2020-06-29 DIAGNOSIS — E162 Hypoglycemia, unspecified: Secondary | ICD-10-CM | POA: Diagnosis not present

## 2020-06-29 DIAGNOSIS — R531 Weakness: Secondary | ICD-10-CM | POA: Diagnosis not present

## 2020-06-29 DIAGNOSIS — C159 Malignant neoplasm of esophagus, unspecified: Secondary | ICD-10-CM | POA: Diagnosis not present

## 2020-06-29 DIAGNOSIS — E440 Moderate protein-calorie malnutrition: Secondary | ICD-10-CM | POA: Diagnosis not present

## 2020-06-29 DIAGNOSIS — R279 Unspecified lack of coordination: Secondary | ICD-10-CM | POA: Diagnosis not present

## 2020-06-29 DIAGNOSIS — J189 Pneumonia, unspecified organism: Secondary | ICD-10-CM | POA: Diagnosis not present

## 2020-06-29 DIAGNOSIS — E46 Unspecified protein-calorie malnutrition: Secondary | ICD-10-CM | POA: Diagnosis not present

## 2020-06-29 DIAGNOSIS — K2100 Gastro-esophageal reflux disease with esophagitis, without bleeding: Secondary | ICD-10-CM | POA: Diagnosis not present

## 2020-06-29 DIAGNOSIS — E1165 Type 2 diabetes mellitus with hyperglycemia: Secondary | ICD-10-CM | POA: Diagnosis not present

## 2020-06-29 DIAGNOSIS — E1122 Type 2 diabetes mellitus with diabetic chronic kidney disease: Secondary | ICD-10-CM | POA: Diagnosis not present

## 2020-06-29 DIAGNOSIS — N183 Chronic kidney disease, stage 3 unspecified: Secondary | ICD-10-CM | POA: Diagnosis not present

## 2020-06-29 DIAGNOSIS — Z743 Need for continuous supervision: Secondary | ICD-10-CM | POA: Diagnosis not present

## 2020-06-29 DIAGNOSIS — K219 Gastro-esophageal reflux disease without esophagitis: Secondary | ICD-10-CM | POA: Diagnosis not present

## 2020-06-29 DIAGNOSIS — I10 Essential (primary) hypertension: Secondary | ICD-10-CM | POA: Diagnosis not present

## 2020-06-29 DIAGNOSIS — N3000 Acute cystitis without hematuria: Secondary | ICD-10-CM | POA: Diagnosis not present

## 2020-06-29 DIAGNOSIS — R419 Unspecified symptoms and signs involving cognitive functions and awareness: Secondary | ICD-10-CM | POA: Diagnosis not present

## 2020-06-29 LAB — GLUCOSE, CAPILLARY
Glucose-Capillary: 112 mg/dL — ABNORMAL HIGH (ref 70–99)
Glucose-Capillary: 148 mg/dL — ABNORMAL HIGH (ref 70–99)
Glucose-Capillary: 199 mg/dL — ABNORMAL HIGH (ref 70–99)

## 2020-06-29 LAB — CULTURE, BLOOD (ROUTINE X 2): Special Requests: ADEQUATE

## 2020-06-29 NOTE — Progress Notes (Signed)
  PROGRESS NOTE  Patient medically stable for discharge to SNF. There was a delay due to report of bedbugs found on patient in the ED. Patient has been bathed and moved to a new room. No bedbugs reported by staff last 2 days.    Dessa Phi, DO [facility] 06/29/2020, 9:17 AM  Available via Epic secure chat 7am-7pm After these hours, please refer to coverage provider listed on [email or web address]

## 2020-06-29 NOTE — Progress Notes (Signed)
Occupational Therapy Treatment Patient Details Name: [name] MRN: [id number] DOB: December 09, 1950 Today's Date: 06/29/2020    History of present illness Patient is a 70 year old man PMH including DM, CKD, possibly early dementia, possible NPH, possible early parkinsonian syndrome presented with worsening weakness over several days, found on floor by wife, febrile per EMS. Admitted for sepsis secondary to pneumonia, UTI.   OT comments  Patient progressing toward acute OT goals overall min G assist for sit to stand from EOB and dynamic standing balance during UB/LB bathing. Patient needing unilateral support holding onto back of chair during bathing task, tolerate standing ~8 mins. HHA x1 to take few steps from EOB to recliner (no walker in room) with no loss of balance noted. Continue with POC.   Follow Up Recommendations  Home health OT;Supervision/Assistance - 24 hour;Other (comment) (vs SNF if patient's spouse cannot provide current level of A)    Equipment Recommendations  Toilet riser       Precautions / Restrictions Precautions Precautions: Fall Restrictions Weight Bearing Restrictions: No       Mobility Bed Mobility Overal bed mobility: Modified Independent                Transfers Overall transfer level: Needs assistance Equipment used: 1 person hand held assist Transfers: Sit to/from Stand Sit to Stand: Min guard         General transfer comment: no assist to power up to standing from EOB, HHA x1 to take few steps to recliner for steadying    Balance Overall balance assessment: Needs assistance Sitting-balance support: Feet supported Sitting balance-Leahy Scale: Good     Standing balance support: Single extremity supported Standing balance-Leahy Scale: Poor Standing balance comment: reliant on at least unilateral support in standing with bathing otherwise unsteadiness increases slight "sway"                           ADL either  performed or assessed with clinical judgement   ADL Overall ADL's : Needs assistance/impaired         Upper Body Bathing: Min guard;Standing Upper Body Bathing Details (indicate cue type and reason): for safety with balance Lower Body Bathing: Min guard;Sit to/from stand Lower Body Bathing Details (indicate cue type and reason): patient wash peri area and upper legs in standing with unilateral UE support and min G assist for safety due to mild instability with dynamic standing balance         Toilet Transfer: Minimal assistance;Ambulation Toilet Transfer Details (indicate cue type and reason): hand held assist x1 to take steps from EOB to recliner chair for safety with balance         Functional mobility during ADLs: Minimal assistance General ADL Comments: upon arrival patient asks if he has on catheter "I feel wet" patient's condom cath not secure with RN informed. pt able to power up to standing from EOB without physical assistance and held onto back of chair with 1 UE and min G assist for steadying during UB/LB bathing               Cognition Arousal/Alertness: Awake/alert Behavior During Therapy: WFL for tasks assessed/performed Overall Cognitive Status: No family/caregiver present to determine baseline cognitive functioning                                 General Comments: patient following directions appropriately  Pertinent Vitals/ Pain       Pain Assessment: No/denies pain         Frequency  Min 2X/week        Progress Toward Goals  OT Goals(current goals can now be found in the care plan section)  Progress towards OT goals: Progressing toward goals  Acute Rehab OT Goals Patient Stated Goal: "Whatever my wife thinks" OT Goal Formulation: With patient Time For Goal Achievement: 07/10/20 Potential to Achieve Goals: Good ADL Goals Pt Will Perform Lower Body Dressing: with supervision;sit to/from  stand;sitting/lateral leans Pt Will Transfer to Toilet: with supervision;ambulating (walker, high toilet) Pt Will Perform Toileting - Clothing Manipulation and hygiene: with supervision;sit to/from stand;sitting/lateral leans  Plan Discharge plan remains appropriate       AM-PAC OT "6 Clicks" Daily Activity     Outcome Measure   Help from another person eating meals?: None Help from another person taking care of personal grooming?: A Little Help from another person toileting, which includes using toliet, bedpan, or urinal?: A Little Help from another person bathing (including washing, rinsing, drying)?: A Little Help from another person to put on and taking off regular upper body clothing?: A Little Help from another person to put on and taking off regular lower body clothing?: A Little 6 Click Score: 19    End of Session  OT Visit Diagnosis: Unsteadiness on feet (R26.81);History of falling (Z91.81);Muscle weakness (generalized) (M62.81);Other symptoms and signs involving cognitive function   Activity Tolerance Patient tolerated treatment well   Patient Left in chair;with call bell/phone within reach;with chair alarm set;with nursing/sitter in room   Nurse Communication Mobility status;Other (comment) (condom cath)        Time: [phone] OT Time Calculation (min): 16 min  Charges: OT General Charges $OT Visit: 1 Visit OT Treatments $Self Care/Home Management : 8-22 mins  Delbert Phenix OT OT pager: [phone]   Rosemary Holms 06/29/2020, 10:03 AM

## 2020-06-29 NOTE — Plan of Care (Signed)
Plan of care reviewed and discussed with the patient. 

## 2020-06-29 NOTE — TOC Transition Note (Signed)
Transition of Care [facility]) - CM/SW Discharge Note   Patient Details  Name: [name] MRN: [id number] Date of Birth: [birth year]  Transition of Care [facility] CM/SW Contact:  Lia Hopping, [place] Phone Number: 06/29/2020, 11:02 AM   Clinical Narrative: Per nursing staff bed bed protocol was followed. Patient cleared.  CSW informed spouse of SNF bed choices. Spouse is agreeable to [facility]. CSW confirm facility will accept the patient today. Spouse to complete paperwork at 1:30pm.  Patient covid negative.  PTAR to transport.   Nurse call report to:      Final next level of care: Skilled Nursing Facility Barriers to Discharge: Continued Medical Work up   Patient Goals and CMS Choice Patient states their goals for this hospitalization and ongoing recovery are:: To get better   Choice offered to / list presented to : Spouse  Discharge Placement   Existing PASRR number confirmed : 06/29/20          Patient chooses bed at: [facility] Patient to be transferred to facility by: Ignacio Name of family member notified: Spouse Tufo,[name] Patient and family notified of of transfer: 06/29/20  Discharge Plan and Services In-house Referral: Clinical Social Work              DME Arranged: N/A DME Agency: NA                  Social Determinants of Health ([place]) Interventions     Readmission Risk Interventions Readmission Risk Prevention Plan 06/27/2020  Post Dischage Appt Complete  Medication Screening Complete  Transportation Screening Complete  Some recent data might be hidden

## 2020-06-29 NOTE — Progress Notes (Signed)
Patient discharged to facility, all belongings w/ patient. Report called.

## 2020-06-30 DIAGNOSIS — C159 Malignant neoplasm of esophagus, unspecified: Secondary | ICD-10-CM | POA: Diagnosis not present

## 2020-06-30 DIAGNOSIS — N183 Chronic kidney disease, stage 3 unspecified: Secondary | ICD-10-CM | POA: Diagnosis not present

## 2020-06-30 DIAGNOSIS — K2270 Barrett's esophagus without dysplasia: Secondary | ICD-10-CM | POA: Diagnosis not present

## 2020-06-30 DIAGNOSIS — E46 Unspecified protein-calorie malnutrition: Secondary | ICD-10-CM | POA: Diagnosis not present

## 2020-06-30 DIAGNOSIS — E1122 Type 2 diabetes mellitus with diabetic chronic kidney disease: Secondary | ICD-10-CM | POA: Diagnosis not present

## 2020-06-30 DIAGNOSIS — R531 Weakness: Secondary | ICD-10-CM | POA: Diagnosis not present

## 2020-06-30 DIAGNOSIS — A419 Sepsis, unspecified organism: Secondary | ICD-10-CM | POA: Diagnosis not present

## 2020-06-30 DIAGNOSIS — R4189 Other symptoms and signs involving cognitive functions and awareness: Secondary | ICD-10-CM | POA: Diagnosis not present

## 2020-06-30 DIAGNOSIS — J189 Pneumonia, unspecified organism: Secondary | ICD-10-CM | POA: Diagnosis not present

## 2020-06-30 LAB — CULTURE, BLOOD (ROUTINE X 2)
Culture: NO GROWTH
Special Requests: ADEQUATE

## 2020-07-03 ENCOUNTER — Ambulatory Visit (HOSPITAL_COMMUNITY): Admission: RE | Admit: 2020-07-03 | Payer: Medicare PPO | Source: Home / Self Care | Admitting: Neurological Surgery

## 2020-07-03 ENCOUNTER — Encounter (HOSPITAL_COMMUNITY): Admission: RE | Payer: Self-pay | Source: Home / Self Care

## 2020-07-03 SURGERY — PLACEMENT OF LUMBAR DRAIN
Anesthesia: General

## 2020-07-07 DIAGNOSIS — C159 Malignant neoplasm of esophagus, unspecified: Secondary | ICD-10-CM | POA: Diagnosis not present

## 2020-07-07 DIAGNOSIS — R419 Unspecified symptoms and signs involving cognitive functions and awareness: Secondary | ICD-10-CM | POA: Diagnosis not present

## 2020-07-07 DIAGNOSIS — E1122 Type 2 diabetes mellitus with diabetic chronic kidney disease: Secondary | ICD-10-CM | POA: Diagnosis not present

## 2020-07-07 DIAGNOSIS — K2100 Gastro-esophageal reflux disease with esophagitis, without bleeding: Secondary | ICD-10-CM | POA: Diagnosis not present

## 2020-07-07 DIAGNOSIS — N183 Chronic kidney disease, stage 3 unspecified: Secondary | ICD-10-CM | POA: Diagnosis not present

## 2020-07-10 DIAGNOSIS — E162 Hypoglycemia, unspecified: Secondary | ICD-10-CM | POA: Diagnosis not present

## 2020-07-10 DIAGNOSIS — R419 Unspecified symptoms and signs involving cognitive functions and awareness: Secondary | ICD-10-CM | POA: Diagnosis not present

## 2020-07-10 DIAGNOSIS — N183 Chronic kidney disease, stage 3 unspecified: Secondary | ICD-10-CM | POA: Diagnosis not present

## 2020-07-10 DIAGNOSIS — E1122 Type 2 diabetes mellitus with diabetic chronic kidney disease: Secondary | ICD-10-CM | POA: Diagnosis not present

## 2020-07-14 DIAGNOSIS — E162 Hypoglycemia, unspecified: Secondary | ICD-10-CM | POA: Diagnosis not present

## 2020-07-14 DIAGNOSIS — N183 Chronic kidney disease, stage 3 unspecified: Secondary | ICD-10-CM | POA: Diagnosis not present

## 2020-07-14 DIAGNOSIS — E1122 Type 2 diabetes mellitus with diabetic chronic kidney disease: Secondary | ICD-10-CM | POA: Diagnosis not present

## 2020-07-14 DIAGNOSIS — K3184 Gastroparesis: Secondary | ICD-10-CM | POA: Diagnosis not present

## 2020-07-14 DIAGNOSIS — R419 Unspecified symptoms and signs involving cognitive functions and awareness: Secondary | ICD-10-CM | POA: Diagnosis not present

## 2020-08-03 DIAGNOSIS — G912 (Idiopathic) normal pressure hydrocephalus: Secondary | ICD-10-CM | POA: Diagnosis not present

## 2020-08-10 ENCOUNTER — Other Ambulatory Visit (HOSPITAL_COMMUNITY)
Admission: RE | Admit: 2020-08-10 | Discharge: 2020-08-10 | Disposition: A | Discharge status: Home / Self Care | Payer: Medicare PPO | Source: Ambulatory Visit | Attending: Neurological Surgery | Admitting: Neurological Surgery

## 2020-08-10 DIAGNOSIS — Z20822 Contact with and (suspected) exposure to covid-19: Secondary | ICD-10-CM | POA: Insufficient documentation

## 2020-08-10 DIAGNOSIS — Z01812 Encounter for preprocedural laboratory examination: Secondary | ICD-10-CM | POA: Diagnosis not present

## 2020-08-10 LAB — SARS CORONAVIRUS 2 (TAT 6-24 HRS): SARS Coronavirus 2: NEGATIVE

## 2020-08-11 ENCOUNTER — Encounter (HOSPITAL_COMMUNITY): Payer: Self-pay | Admitting: Neurological Surgery

## 2020-08-11 NOTE — Progress Notes (Signed)
Anesthesia Chart Review: Kathleene Hazel    Case: [id number] Date/Time: 08/14/20 0715   Procedure: PLACEMENT OF LUMBAR DRAIN (N/A )   Anesthesia type: General   Pre-op diagnosis: Normal pressure hydrocephalus   Location: [facility] OR ROOM 18 / [facility]   Surgeons: Judith Part, MD      DISCUSSION: Patient is a 70 year old male scheduled for the above procedure.  History includes never smoker, Parkinson's disease, dementia, DM2, HTN, esophageal cancer (s/p left VATS, left thoracoabdominal esophagogastrectomy with jejunostomy and pyloroplasty 04/24/04, gastroparesis, CKD stage III, OSA, normal pressure hydrocephalus, anemia.   Admission 06/24/20-06/29/20 for sepsis secondary to UTI. Possible left basilar PNA on CXR.   Presurgical COVID-19 test negative 08/10/2020.  He has a same-day work-up, so further evaluation by his anesthesia team on the day of surgery.  VS:   BP Readings from Last 3 Encounters:  06/29/20 126/71  05/01/20 112/74  02/16/20 98/68   Pulse Readings from Last 3 Encounters:  06/29/20 68  05/01/20 96  02/16/20 (!) 102    PROVIDERS: Jani Gravel, MD is PCP  Sarina Ill, MD is neurologist   LABS: For day of surgery. As of 06/26/20, H/H 10.8/33.4, Cr 1.13. A1c 5.2% 06/25/20.    IMAGES: 1V CXR 06/24/20 (during sepsis admission): FINDINGS: Heart is normal size. Possible small left effusion. Patchy opacities in the left lower lung. Right lung clear. No acute bony abnormality. IMPRESSION: Suspect small left effusion with patchy opacities at the left lung base. This could reflect pneumonia.  MRI Brain 01/23/20: IMPRESSION: This MRI of the brain with and without contrast shows the following: 1.   Moderate generalized cortical atrophy and mild cerebellar and corpus callosal atrophy.  This has progressed compared to 2009 2.   There is ventriculomegaly that appears to be out of proportion to the extent of generalized cortical atrophy.  Normal pressure hydrocephalus is  possible. 3.   Mild scattered T2/FLAIR hyperintense foci in the hemispheres consistent with mild chronic microvascular ischemic change.  None of the foci appear to be acute.  This has progressed compared to 2009. 4.   There were no acute findings and there was a normal enhancement pattern.   EKG: 06/24/20: Sinus tachycardia at 102 bpm  Atrial premature complex Borderline left axis deviation Consider anterior infarct No significant change since last tracing Confirmed by Wandra Arthurs [phone]) on 06/24/2020 6:22:42 PM   CV: N/A  Past Medical History:  Diagnosis Date  . Anemia   . Barrett esophagus   . Chronic kidney disease (CKD), stage III (moderate) (HCC)   . Dementia ([place])   . Diabetes mellitus ([place])   . Esophageal cancer ([place])   . Gastroparesis   . GERD (gastroesophageal reflux disease)   . History of MRSA infection   . Hydrocephalus ([place])   . Hypertension   . Parkinson's disease ([place])   . Pneumonia   . Sleep apnea    no longer uses a cpap    Past Surgical History:  Procedure Laterality Date  . Left VATS, Left thoracoabdominal esophagogastrectomy with jejunostomy and pyloroplasty     04/24/2004 Dr Arlyce Dice  . LUMBAR PUNCTURE  02/15/2020  . R cataract and retinal tears      MEDICATIONS: No current facility-administered medications for this encounter.   . esomeprazole (NEXIUM) 40 MG capsule  . Iron-FA-B Cmp-C-Biot-Probiotic (FUSION PLUS) CAPS  . metFORMIN (GLUCOPHAGE-XR) 750 MG 24 hr tablet  . Multiple Vitamin (MULTIVITAMIN WITH MINERALS) TABS tablet  . quinapril-hydrochlorothiazide (ACCURETIC) 10-12.5 MG per  tablet    Myra Gianotti, PA-C Surgical Short Stay/Anesthesiology [facility] Phone [phone] [facility] Phone [phone] 08/11/2020 3:57 PM

## 2020-08-11 NOTE — Progress Notes (Signed)
Spoke with pt's wife, [name] for pre-op call. DPR on file. She states pt does not have a cardiac history. Pt is treated for HTN and Type 2 diabetes. Last A1C was 6.6 on 06/25/20. [name] states they do not have a CBG meter at home. Pt does have cognitive issues due to Parksinson's and dementia. [name] will need to be in pre-op with pt day of surgery. Pt is not to take Metformin the morning of sugery.   Covid test done 08/10/20 and it's negative. [name] states patient has been in quarantine since the test was done and understands that he stays in quarantine until he comes to the hospital on Monday.

## 2020-08-11 NOTE — Anesthesia Preprocedure Evaluation (Addendum)
Anesthesia Evaluation  Patient identified by MRN, date of birth, ID band Patient awake    Reviewed: Allergy & Precautions, NPO status , Patient's Chart, lab work & pertinent test results  History of Anesthesia Complications Negative for: history of anesthetic complications  Airway Mallampati: II  TM Distance: >3 FB Neck ROM: Full    Dental no notable dental hx. (+) Caps   Pulmonary sleep apnea ,    Pulmonary exam normal        Cardiovascular hypertension, Pt. on medications Normal cardiovascular exam     Neuro/Psych Dementia Normal pressure hydrocephalus, Parkinson's disease     GI/Hepatic Neg liver ROS, GERD  Medicated and Controlled,  Endo/Other  diabetes, Type 2, Oral Hypoglycemic Agents  Renal/GU Renal InsufficiencyRenal disease  negative genitourinary   Musculoskeletal negative musculoskeletal ROS (+)   Abdominal   Peds  Hematology negative hematology ROS (+)   Anesthesia Other Findings esophageal cancer (s/p left VATS, left thoracoabdominal esophagogastrectomy with jejunostomy and pyloroplasty 04/24/04)  Reproductive/Obstetrics negative OB ROS                           Anesthesia Physical Anesthesia Plan  ASA: III  Anesthesia Plan: MAC   Post-op Pain Management:    Induction:   PONV Risk Score and Plan: 2 and Treatment may vary due to age or medical condition, Ondansetron and Propofol infusion  Airway Management Planned: Natural Airway and Nasal Cannula  Additional Equipment: None  Intra-op Plan:   Post-operative Plan:   Informed Consent: I have reviewed the patients History and Physical, chart, labs and discussed the procedure including the risks, benefits and alternatives for the proposed anesthesia with the patient or authorized representative who has indicated his/her understanding and acceptance.       Plan Discussed with: CRNA  Anesthesia Plan Comments: (PAT  note written 08/11/2020 by Myra Gianotti, PA-C. )     Anesthesia Quick Evaluation

## 2020-08-14 ENCOUNTER — Inpatient Hospital Stay (HOSPITAL_COMMUNITY): Payer: Medicare PPO | Admitting: Vascular Surgery

## 2020-08-14 ENCOUNTER — Inpatient Hospital Stay (HOSPITAL_COMMUNITY)
Admission: RE | Admit: 2020-08-14 | Discharge: 2020-08-17 | DRG: 057 | Disposition: A | Discharge status: Home Health | Payer: Medicare PPO | Attending: Neurological Surgery | Admitting: Neurological Surgery

## 2020-08-14 ENCOUNTER — Encounter (HOSPITAL_COMMUNITY)
Admission: RE | Disposition: A | Discharge status: Home Health | Payer: Self-pay | Source: Home / Self Care | Attending: Neurological Surgery

## 2020-08-14 ENCOUNTER — Inpatient Hospital Stay (HOSPITAL_COMMUNITY): Payer: Medicare PPO

## 2020-08-14 ENCOUNTER — Encounter (HOSPITAL_COMMUNITY): Payer: Self-pay | Admitting: Neurological Surgery

## 2020-08-14 ENCOUNTER — Other Ambulatory Visit: Payer: Self-pay

## 2020-08-14 DIAGNOSIS — K219 Gastro-esophageal reflux disease without esophagitis: Secondary | ICD-10-CM | POA: Diagnosis not present

## 2020-08-14 DIAGNOSIS — G912 (Idiopathic) normal pressure hydrocephalus: Principal | ICD-10-CM | POA: Diagnosis present

## 2020-08-14 DIAGNOSIS — G4733 Obstructive sleep apnea (adult) (pediatric): Secondary | ICD-10-CM | POA: Diagnosis not present

## 2020-08-14 DIAGNOSIS — F0280 Dementia in other diseases classified elsewhere without behavioral disturbance: Secondary | ICD-10-CM | POA: Diagnosis not present

## 2020-08-14 DIAGNOSIS — G20 Parkinson's disease: Secondary | ICD-10-CM | POA: Diagnosis not present

## 2020-08-14 DIAGNOSIS — Z803 Family history of malignant neoplasm of breast: Secondary | ICD-10-CM

## 2020-08-14 DIAGNOSIS — Z419 Encounter for procedure for purposes other than remedying health state, unspecified: Secondary | ICD-10-CM

## 2020-08-14 DIAGNOSIS — E1122 Type 2 diabetes mellitus with diabetic chronic kidney disease: Secondary | ICD-10-CM | POA: Diagnosis not present

## 2020-08-14 DIAGNOSIS — Z8249 Family history of ischemic heart disease and other diseases of the circulatory system: Secondary | ICD-10-CM

## 2020-08-14 DIAGNOSIS — Z8501 Personal history of malignant neoplasm of esophagus: Secondary | ICD-10-CM

## 2020-08-14 DIAGNOSIS — E1143 Type 2 diabetes mellitus with diabetic autonomic (poly)neuropathy: Secondary | ICD-10-CM | POA: Diagnosis present

## 2020-08-14 DIAGNOSIS — K3184 Gastroparesis: Secondary | ICD-10-CM | POA: Diagnosis present

## 2020-08-14 DIAGNOSIS — Z800 Family history of malignant neoplasm of digestive organs: Secondary | ICD-10-CM | POA: Diagnosis not present

## 2020-08-14 DIAGNOSIS — G9389 Other specified disorders of brain: Secondary | ICD-10-CM | POA: Diagnosis present

## 2020-08-14 DIAGNOSIS — Z981 Arthrodesis status: Secondary | ICD-10-CM | POA: Diagnosis not present

## 2020-08-14 DIAGNOSIS — Z823 Family history of stroke: Secondary | ICD-10-CM | POA: Diagnosis not present

## 2020-08-14 DIAGNOSIS — Z7984 Long term (current) use of oral hypoglycemic drugs: Secondary | ICD-10-CM | POA: Diagnosis not present

## 2020-08-14 DIAGNOSIS — N183 Chronic kidney disease, stage 3 unspecified: Secondary | ICD-10-CM | POA: Diagnosis present

## 2020-08-14 DIAGNOSIS — I129 Hypertensive chronic kidney disease with stage 1 through stage 4 chronic kidney disease, or unspecified chronic kidney disease: Secondary | ICD-10-CM | POA: Diagnosis not present

## 2020-08-14 DIAGNOSIS — D631 Anemia in chronic kidney disease: Secondary | ICD-10-CM | POA: Diagnosis not present

## 2020-08-14 HISTORY — DX: Anemia, unspecified: D64.9

## 2020-08-14 HISTORY — DX: Sleep apnea, unspecified: G47.30

## 2020-08-14 HISTORY — DX: Pneumonia, unspecified organism: J18.9

## 2020-08-14 HISTORY — PX: PLACEMENT OF LUMBAR DRAIN: SHX6028

## 2020-08-14 HISTORY — DX: Hydrocephalus, unspecified: G91.9

## 2020-08-14 LAB — CBC
HCT: 35.1 % — ABNORMAL LOW (ref 39.0–52.0)
Hemoglobin: 11.3 g/dL — ABNORMAL LOW (ref 13.0–17.0)
MCH: 30.4 pg (ref 26.0–34.0)
MCHC: 32.2 g/dL (ref 30.0–36.0)
MCV: 94.4 fL (ref 80.0–100.0)
Platelets: 205 10*3/uL (ref 150–400)
RBC: 3.72 MIL/uL — ABNORMAL LOW (ref 4.22–5.81)
RDW: 13.2 % (ref 11.5–15.5)
WBC: 5.7 10*3/uL (ref 4.0–10.5)
nRBC: 0 % (ref 0.0–0.2)

## 2020-08-14 LAB — SURGICAL PCR SCREEN
MRSA, PCR: NEGATIVE
Staphylococcus aureus: NEGATIVE

## 2020-08-14 LAB — BASIC METABOLIC PANEL
Anion gap: 9 (ref 5–15)
BUN: 31 mg/dL — ABNORMAL HIGH (ref 8–23)
CO2: 27 mmol/L (ref 22–32)
Calcium: 8.7 mg/dL — ABNORMAL LOW (ref 8.9–10.3)
Chloride: 102 mmol/L (ref 98–111)
Creatinine, Ser: 1.63 mg/dL — ABNORMAL HIGH (ref 0.61–1.24)
GFR, Estimated: 45 mL/min — ABNORMAL LOW (ref >60)
Glucose, Bld: 109 mg/dL — ABNORMAL HIGH (ref 70–99)
Potassium: 4 mmol/L (ref 3.5–5.1)
Sodium: 138 mmol/L (ref 135–145)

## 2020-08-14 LAB — GLUCOSE, CAPILLARY
Glucose-Capillary: 81 mg/dL (ref 70–99)
Glucose-Capillary: 116 mg/dL — ABNORMAL HIGH (ref 70–99)
Glucose-Capillary: 126 mg/dL — ABNORMAL HIGH (ref 70–99)

## 2020-08-14 LAB — MRSA PCR SCREENING: MRSA by PCR: NEGATIVE

## 2020-08-14 SURGERY — PLACEMENT OF LUMBAR DRAIN
Anesthesia: General

## 2020-08-14 MED ORDER — CHLORHEXIDINE GLUCONATE 0.12 % MT SOLN
15.0000 mL | Freq: Once | OROMUCOSAL | Status: AC
  Administered 2020-08-14: 15 mL via OROMUCOSAL
  Filled 2020-08-14: qty 15

## 2020-08-14 MED ORDER — LACTATED RINGERS IV SOLN: INTRAVENOUS | Status: DC

## 2020-08-14 MED ORDER — PROMETHAZINE HCL 25 MG/ML IJ SOLN: 6.2500 mg | INTRAMUSCULAR | Status: DC | PRN

## 2020-08-14 MED ORDER — ADULT MULTIVITAMIN W/MINERALS CH
1.0000 | ORAL_TABLET | Freq: Every day | ORAL | Status: DC
  Administered 2020-08-14 – 2020-08-17 (×4): 1 via ORAL
  Filled 2020-08-14 (×4): qty 1

## 2020-08-14 MED ORDER — ORAL CARE MOUTH RINSE: 15.0000 mL | Freq: Once | OROMUCOSAL | Status: AC

## 2020-08-14 MED ORDER — LIDOCAINE HCL 1 % IJ SOLN
INTRAMUSCULAR | Status: AC
  Filled 2020-08-14: qty 20

## 2020-08-14 MED ORDER — PROMETHAZINE HCL 25 MG PO TABS: 12.5000 mg | ORAL_TABLET | ORAL | Status: DC | PRN

## 2020-08-14 MED ORDER — SUFENTANIL CITRATE 50 MCG/ML IV SOLN
INTRAVENOUS | Status: AC
  Filled 2020-08-14: qty 1

## 2020-08-14 MED ORDER — ACETAMINOPHEN 325 MG PO TABS: 650.0000 mg | ORAL_TABLET | ORAL | Status: DC | PRN

## 2020-08-14 MED ORDER — POLYETHYLENE GLYCOL 3350 17 G PO PACK: 17.0000 g | PACK | Freq: Every day | ORAL | Status: DC | PRN

## 2020-08-14 MED ORDER — HYDROCHLOROTHIAZIDE 12.5 MG PO CAPS
12.5000 mg | ORAL_CAPSULE | Freq: Every day | ORAL | Status: DC
  Administered 2020-08-14 – 2020-08-17 (×4): 12.5 mg via ORAL
  Filled 2020-08-14 (×4): qty 1

## 2020-08-14 MED ORDER — ONDANSETRON HCL 4 MG/2ML IJ SOLN: 4.0000 mg | INTRAMUSCULAR | Status: DC | PRN

## 2020-08-14 MED ORDER — QUINAPRIL-HYDROCHLOROTHIAZIDE 10-12.5 MG PO TABS: 1.0000 | ORAL_TABLET | Freq: Every day | ORAL | Status: DC

## 2020-08-14 MED ORDER — CHLORHEXIDINE GLUCONATE CLOTH 2 % EX PADS: 6.0000 | MEDICATED_PAD | Freq: Once | CUTANEOUS | Status: DC

## 2020-08-14 MED ORDER — MIDAZOLAM HCL 2 MG/2ML IJ SOLN
INTRAMUSCULAR | Status: AC
  Filled 2020-08-14: qty 2

## 2020-08-14 MED ORDER — THROMBIN 5000 UNITS EX SOLR
CUTANEOUS | Status: AC
  Filled 2020-08-14: qty 5000

## 2020-08-14 MED ORDER — ONDANSETRON HCL 4 MG/2ML IJ SOLN
INTRAMUSCULAR | Status: DC | PRN
  Administered 2020-08-14: 4 mg via INTRAVENOUS

## 2020-08-14 MED ORDER — LIDOCAINE-EPINEPHRINE 1 %-1:100000 IJ SOLN
INTRAMUSCULAR | Status: DC | PRN
  Administered 2020-08-14: 20 mL

## 2020-08-14 MED ORDER — DOCUSATE SODIUM 100 MG PO CAPS
100.0000 mg | ORAL_CAPSULE | Freq: Two times a day (BID) | ORAL | Status: DC
  Administered 2020-08-15 – 2020-08-17 (×3): 100 mg via ORAL
  Filled 2020-08-14 (×4): qty 1

## 2020-08-14 MED ORDER — ACETAMINOPHEN 500 MG PO TABS
1000.0000 mg | ORAL_TABLET | Freq: Once | ORAL | Status: AC
  Administered 2020-08-14: 1000 mg via ORAL
  Filled 2020-08-14: qty 2

## 2020-08-14 MED ORDER — OXYCODONE HCL 5 MG/5ML PO SOLN: 5.0000 mg | Freq: Once | ORAL | Status: DC | PRN

## 2020-08-14 MED ORDER — ONDANSETRON HCL 4 MG PO TABS: 4.0000 mg | ORAL_TABLET | ORAL | Status: DC | PRN

## 2020-08-14 MED ORDER — HYDROMORPHONE HCL 1 MG/ML IJ SOLN: 0.5000 mg | INTRAMUSCULAR | Status: DC | PRN

## 2020-08-14 MED ORDER — LIDOCAINE HCL (CARDIAC) PF 100 MG/5ML IV SOSY
PREFILLED_SYRINGE | INTRAVENOUS | Status: DC | PRN
  Administered 2020-08-14: 50 mg via INTRAVENOUS

## 2020-08-14 MED ORDER — LISINOPRIL 10 MG PO TABS
10.0000 mg | ORAL_TABLET | Freq: Every day | ORAL | Status: DC
  Administered 2020-08-14 – 2020-08-17 (×4): 10 mg via ORAL
  Filled 2020-08-14 (×4): qty 1

## 2020-08-14 MED ORDER — 0.9 % SODIUM CHLORIDE (POUR BTL) OPTIME
TOPICAL | Status: DC | PRN
  Administered 2020-08-14: 1000 mL

## 2020-08-14 MED ORDER — METFORMIN HCL ER 750 MG PO TB24
750.0000 mg | ORAL_TABLET | Freq: Two times a day (BID) | ORAL | Status: DC
  Administered 2020-08-14 – 2020-08-17 (×7): 750 mg via ORAL
  Filled 2020-08-14 (×8): qty 1

## 2020-08-14 MED ORDER — OXYCODONE HCL 5 MG PO TABS: 5.0000 mg | ORAL_TABLET | Freq: Once | ORAL | Status: DC | PRN

## 2020-08-14 MED ORDER — MIDAZOLAM HCL 2 MG/2ML IJ SOLN
INTRAMUSCULAR | Status: DC | PRN
  Administered 2020-08-14 (×2): 1 mg via INTRAVENOUS

## 2020-08-14 MED ORDER — PROPOFOL 10 MG/ML IV BOLUS
INTRAVENOUS | Status: DC | PRN
  Administered 2020-08-14: 10 mg via INTRAVENOUS
  Administered 2020-08-14: 30 mg via INTRAVENOUS
  Administered 2020-08-14: 10 mg via INTRAVENOUS
  Administered 2020-08-14: 30 mg via INTRAVENOUS
  Administered 2020-08-14: 20 mg via INTRAVENOUS

## 2020-08-14 MED ORDER — FENTANYL CITRATE (PF) 100 MCG/2ML IJ SOLN: 25.0000 ug | INTRAMUSCULAR | Status: DC | PRN

## 2020-08-14 MED ORDER — ACETAMINOPHEN 650 MG RE SUPP: 650.0000 mg | RECTAL | Status: DC | PRN

## 2020-08-14 MED ORDER — LACTATED RINGERS IV SOLN: INTRAVENOUS | Status: DC | PRN

## 2020-08-14 MED ORDER — PANTOPRAZOLE SODIUM 40 MG PO TBEC
40.0000 mg | DELAYED_RELEASE_TABLET | Freq: Every day | ORAL | Status: DC
  Administered 2020-08-15 – 2020-08-17 (×3): 40 mg via ORAL
  Filled 2020-08-14 (×3): qty 1

## 2020-08-14 MED ORDER — LABETALOL HCL 5 MG/ML IV SOLN: 10.0000 mg | INTRAVENOUS | Status: DC | PRN

## 2020-08-14 MED ORDER — PROPOFOL 10 MG/ML IV BOLUS
INTRAVENOUS | Status: AC
  Filled 2020-08-14: qty 20

## 2020-08-14 MED ORDER — CEFAZOLIN SODIUM-DEXTROSE 2-4 GM/100ML-% IV SOLN
2.0000 g | INTRAVENOUS | Status: AC
  Administered 2020-08-14: 2 g via INTRAVENOUS

## 2020-08-14 MED ORDER — CEFAZOLIN SODIUM-DEXTROSE 2-4 GM/100ML-% IV SOLN
INTRAVENOUS | Status: AC
  Filled 2020-08-14: qty 100

## 2020-08-14 MED ORDER — HYDROCODONE-ACETAMINOPHEN 5-325 MG PO TABS: 1.0000 | ORAL_TABLET | ORAL | Status: DC | PRN

## 2020-08-14 MED ORDER — LIDOCAINE-EPINEPHRINE 1 %-1:100000 IJ SOLN
INTRAMUSCULAR | Status: AC
  Filled 2020-08-14: qty 1

## 2020-08-14 SURGICAL SUPPLY — 38 items
ADH SKN CLS APL DERMABOND .7 (GAUZE/BANDAGES/DRESSINGS) ×1
BLADE CLIPPER SURG (BLADE) IMPLANT
BLADE SURG 15 STRL LF DISP TIS (BLADE) ×1 IMPLANT
BLADE SURG 15 STRL SS (BLADE) ×2
CORD BIPOLAR FORCEPS 12FT (ELECTRODE) ×2 IMPLANT
COVER WAND RF STERILE (DRAPES) ×2 IMPLANT
DERMABOND ADVANCED (GAUZE/BANDAGES/DRESSINGS) ×1
DERMABOND ADVANCED .7 DNX12 (GAUZE/BANDAGES/DRESSINGS) ×1 IMPLANT
DRAIN SUBARACHNOID (WOUND CARE) ×2 IMPLANT
DRAPE C-ARM 42X72 X-RAY (DRAPES) ×4 IMPLANT
DRAPE HALF SHEET 40X57 (DRAPES) ×2 IMPLANT
DRAPE INCISE IOBAN 66X45 STRL (DRAPES) ×1 IMPLANT
DRAPE LAPAROTOMY 100X72X124 (DRAPES) ×2 IMPLANT
DRAPE SURG 17X23 STRL (DRAPES) ×2 IMPLANT
DRSG OPSITE 4X5.5 SM (GAUZE/BANDAGES/DRESSINGS) ×1 IMPLANT
DURAPREP 26ML APPLICATOR (WOUND CARE) ×2 IMPLANT
GAUZE 4X4 16PLY RFD (DISPOSABLE) ×2 IMPLANT
GLOVE BIOGEL PI IND STRL 7.5 (GLOVE) ×2 IMPLANT
GLOVE BIOGEL PI INDICATOR 7.5 (GLOVE) ×2
GLOVE ECLIPSE 7.5 STRL STRAW (GLOVE) ×1 IMPLANT
GLOVE SURG UNDER POLY LF SZ7 (GLOVE) ×2 IMPLANT
GOWN STRL REUS W/ TWL LRG LVL3 (GOWN DISPOSABLE) ×1 IMPLANT
GOWN STRL REUS W/ TWL XL LVL3 (GOWN DISPOSABLE) IMPLANT
GOWN STRL REUS W/TWL 2XL LVL3 (GOWN DISPOSABLE) IMPLANT
GOWN STRL REUS W/TWL LRG LVL3 (GOWN DISPOSABLE) ×4
GOWN STRL REUS W/TWL XL LVL3 (GOWN DISPOSABLE)
KIT BASIN OR (CUSTOM PROCEDURE TRAY) ×2 IMPLANT
KIT DRAIN CSF ACCUDRAIN (MISCELLANEOUS) ×1 IMPLANT
KIT TURNOVER KIT B (KITS) ×2 IMPLANT
NDL HYPO 25X1 1.5 SAFETY (NEEDLE) ×1 IMPLANT
NEEDLE HYPO 25X1 1.5 SAFETY (NEEDLE) ×2 IMPLANT
NS IRRIG 1000ML POUR BTL (IV SOLUTION) ×2 IMPLANT
PACK SURGICAL SETUP 50X90 (CUSTOM PROCEDURE TRAY) ×2 IMPLANT
PAD ARMBOARD 7.5X6 YLW CONV (MISCELLANEOUS) ×3 IMPLANT
SUT VIC AB 2-0 CP2 18 (SUTURE) ×2 IMPLANT
SYR CONTROL 10ML LL (SYRINGE) ×3 IMPLANT
TOWEL GREEN STERILE (TOWEL DISPOSABLE) ×2 IMPLANT
TOWEL GREEN STERILE FF (TOWEL DISPOSABLE) ×2 IMPLANT

## 2020-08-14 NOTE — Op Note (Signed)
PATIENT: [name]  DAY OF SURGERY: 08/14/20   PRE-OPERATIVE DIAGNOSIS:  Normal pressure hydrocephalus   POST-OPERATIVE DIAGNOSIS:  Same   PROCEDURE:  Lumbar drain placement   SURGEON:  Surgeon(s) and Role:    Judith Part, MD - Primary   ANESTHESIA: ETGA   BRIEF HISTORY: This is a 70 year old man who presented with progressive cognitive decline with ventriculomegaly and neuropsych testing consistent with possible NPH. TThis was discussed with the patient as well as risks, benefits, and alternatives and wished to proceed with surgery.   OPERATIVE DETAIL: The patient was taken to the operating room and placed on the OR table in the lateral decubitus position. A formal time out was performed with two patient identifiers and confirmed the operative site. MAC was induced by the anesthesia team.   The operative site was in the midline of the mid-cristal plane, the area was then prepped and draped in a sterile fashion. Local anesthetic was infiltrated and a Tuohy needle was advanced with fluoroscopic guidance into the L3-4 interspace with good flow of CSF under low pressure. A drain was threaded without resistance, confirmed with fluoro, needle was removed, and the drain was secured at the skin with continued good flow after securing.  All instrument and sponge counts were correct, a sterile dressing was applied. The patient was then returned to anesthesia for emergence. No apparent complications at the completion of the procedure.   EBL:  21m   DRAINS: Lumbar drain in subarachnoid space   SPECIMENS: none   TJudith Part MD 08/14/20 7:29 AM

## 2020-08-14 NOTE — Progress Notes (Signed)
Neurosurgery Service Post-operative progress note  Assessment & Plan: 70 y.o. man s/p LD placement, seen at bedside, MAEx4, doing well.  -LD at 10cc/hr, can clamp when out of bed, activity as tolerated  Judith Part  08/14/20 9:50 AM

## 2020-08-14 NOTE — Anesthesia Procedure Notes (Signed)
Procedure Name: MAC Date/Time: 08/14/2020 7:40 AM Performed by: Claris Che, CRNA Pre-anesthesia Checklist: Patient identified, Emergency Drugs available, Suction available, Patient being monitored and Timeout performed Oxygen Delivery Method: Simple face mask

## 2020-08-14 NOTE — Transfer of Care (Signed)
Immediate Anesthesia Transfer of Care Note  Patient: [name]  Procedure(s) Performed: PLACEMENT OF LUMBAR DRAIN (N/A )  Patient Location: PACU  Anesthesia Type:MAC  Level of Consciousness: awake, alert , oriented and patient cooperative  Airway & Oxygen Therapy: Patient Spontanous Breathing  Post-op Assessment: Report given to RN, Post -op Vital signs reviewed and stable and Patient moving all extremities X 4  Post vital signs: Reviewed and stable  Last Vitals:  Vitals Value Taken Time  BP 122/58 08/14/20 0840  Temp    Pulse    Resp 14 08/14/20 0840  SpO2    Vitals shown include unvalidated device data.  Last Pain:  Vitals:   08/14/20 0641  TempSrc:   PainSc: 0-No pain         Complications: No complications documented.

## 2020-08-14 NOTE — H&P (Signed)
Surgical H&P Update  HPI: 70 y.o. man with history of progressive cognitive decline and ventriculomegaly. Neuropsych testing for possible component of NPH contributing to decline. No changes in health since he was last seen. Still having cognitive issues and wishes to proceed with surgery.  PMHx:  Past Medical History:  Diagnosis Date  . Anemia   . Barrett esophagus   . Chronic kidney disease (CKD), stage III (moderate) (HCC)   . Dementia ([place])   . Diabetes mellitus ([place])   . Esophageal cancer ([place])   . Gastroparesis   . GERD (gastroesophageal reflux disease)   . History of MRSA infection   . Hydrocephalus ([place])   . Hypertension   . Parkinson's disease ([place])   . Pneumonia   . Sleep apnea    no longer uses a cpap   FamHx:  Family History  Problem Relation Age of Onset  . Cancer Mother        colon, breast  . Heart disease Father   . Diabetes Other        FX HX  . Stroke Paternal Aunt   . Dementia Neg Hx   . Parkinson's disease Neg Hx    SocHx:  reports that he has never smoked. He has never used smokeless tobacco. He reports current alcohol use. He reports that he does not use drugs.  Physical Exam: AOx3, PERRL, FS, TM  Strength 5/5 x4, SILTx4  Assesment/Plan: 70 y.o. man with h/o possible NPH, here for lumbar drain placement. Risks, benefits, and alternatives discussed and the patient would like to continue with surgery.  -OR today -4N post-op  Judith Part, MD 08/14/20 7:26 AM

## 2020-08-14 NOTE — Anesthesia Postprocedure Evaluation (Signed)
Anesthesia Post Note  Patient: [name]  Procedure(s) Performed: PLACEMENT OF LUMBAR DRAIN (N/A )     Patient location during evaluation: PACU Anesthesia Type: MAC Level of consciousness: awake and alert and oriented Pain management: pain level controlled Vital Signs Assessment: post-procedure vital signs reviewed and stable Respiratory status: spontaneous breathing, nonlabored ventilation and respiratory function stable Cardiovascular status: blood pressure returned to baseline Postop Assessment: no apparent nausea or vomiting Anesthetic complications: no   No complications documented.  Last Vitals:  Vitals:   08/14/20 0920 08/14/20 1000  BP: (!) 142/76 125/76  Pulse:  81  Resp: 15 15  Temp:    SpO2:  100%    Last Pain:  Vitals:   08/14/20 0920  TempSrc:   PainSc: 0-No pain                 Brennan Bailey

## 2020-08-15 ENCOUNTER — Encounter (HOSPITAL_COMMUNITY): Payer: Self-pay | Admitting: Neurological Surgery

## 2020-08-15 MED ORDER — CHLORHEXIDINE GLUCONATE CLOTH 2 % EX PADS
6.0000 | MEDICATED_PAD | Freq: Every day | CUTANEOUS | Status: DC
  Administered 2020-08-15 – 2020-08-17 (×3): 6 via TOPICAL

## 2020-08-15 NOTE — Evaluation (Signed)
Physical Therapy Evaluation Patient Details Name: [name] MRN: [id number] DOB: 1951/01/13 Today's Date: 08/15/2020   History of Present Illness  Pt is a 70 y.o. M admitted 3/28 with progressive cognitive decline and ventriculomegaly; neuropsych testing indicative of normal pressure hydrocephalus and pt now s/p lumbar drain placement 3/28. Significant PMH: dementia, CKD stage III, diabetes mellitus, Parkinson's disease.  Clinical Impression  Prior to admission, pt lives with his spouse, uses a walker for mobility and has required assist with ADL's since "January." Pt tolerated initial out of bed trial well. Ambulating x 100 feet with a walker at a min guard assist level. Reports mild dizziness towards end of walk, BP pre mobility 131/83, post mobility 132/81. Displays decreased cognition, balance deficits, and decreased endurance. Recommend HHPT to address deficits and maximize functional independence.     Follow Up Recommendations Home health PT;Supervision/Assistance - 24 hour    Equipment Recommendations  None recommended by PT    Recommendations for Other Services       Precautions / Restrictions Precautions Precautions: Fall;Other (comment) Precaution Comments: Lumbar drain, clamp prior to mobility Restrictions Weight Bearing Restrictions: No      Mobility  Bed Mobility Overal bed mobility: Needs Assistance Bed Mobility: Supine to Sit     Supine to sit: Supervision     General bed mobility comments: Supervision for safety, no physical A required    Transfers Overall transfer level: Needs assistance Equipment used: Rolling walker (2 wheeled) Transfers: Sit to/from Stand Sit to Stand: Min guard;+2 safety/equipment         General transfer comment: Min guard to rise from edge of bed, cues for reciprocal scooting and hand placement for transition  Ambulation/Gait Ambulation/Gait assistance: Min guard;+2 safety/equipment Gait Distance (Feet): 100  Feet Assistive device: Rolling walker (2 wheeled) Gait Pattern/deviations: Step-through pattern;Decreased stride length;Trunk flexed Gait velocity: decreased Gait velocity interpretation: <1.8 ft/sec, indicate of risk for recurrent falls General Gait Details: Cues for upward gaze, min guard for safety. No overt LOB  Stairs            Wheelchair Mobility    Modified Rankin (Stroke Patients Only)       Balance Overall balance assessment: Needs assistance Sitting-balance support: Feet supported Sitting balance-Leahy Scale: Good     Standing balance support: Bilateral upper extremity supported Standing balance-Leahy Scale: Poor Standing balance comment: reliant on external support                             Pertinent Vitals/Pain Pain Assessment: No/denies pain    Home Living Family/patient expects to be discharged to:: Private residence Living Arrangements: Spouse/significant other Available Help at Discharge: Family Type of Home: House Home Access: Stairs to enter   Technical brewer of Steps: 3 Home Layout: One level Home Equipment: Environmental consultant - 2 wheels      Prior Function Level of Independence: Needs assistance   Gait / Transfers Assistance Needed: reports using walker prior to admission and needing help from spouse with steps  ADL's / Homemaking Assistance Needed: spouse is my "primary care taker" assisted with ADL/IADLs. pt states has been sponge bathing vs transfer into step in shower        Hand Dominance   Dominant Hand: Right    Extremity/Trunk Assessment   Upper Extremity Assessment Upper Extremity Assessment: Defer to OT evaluation    Lower Extremity Assessment Lower Extremity Assessment: Overall WFL for tasks assessed    Cervical /  Trunk Assessment Cervical / Trunk Assessment: Kyphotic  Communication   Communication: No difficulties  Cognition Arousal/Alertness: Awake/alert Behavior During Therapy: WFL for tasks  assessed/performed Overall Cognitive Status: History of cognitive impairments - at baseline                                 General Comments: History of dementia and progressive cognitive decline. Pt following all commands, needs max cues for wayfinding/navigation in hallway, decreased STM      General Comments      Exercises     Assessment/Plan    PT Assessment Patient needs continued PT services  PT Problem List Decreased strength;Decreased activity tolerance;Decreased balance;Decreased mobility;Decreased cognition;Decreased safety awareness       PT Treatment Interventions DME instruction;Gait training;Functional mobility training;Stair training;Therapeutic activities;Therapeutic exercise;Balance training;Patient/family education    PT Goals (Current goals can be found in the Care Plan section)  Acute Rehab PT Goals Patient Stated Goal: return to baseline PT Goal Formulation: With patient Time For Goal Achievement: 08/29/20 Potential to Achieve Goals: Good    Frequency Min 3X/week   Barriers to discharge        Co-evaluation               AM-PAC PT "6 Clicks" Mobility  Outcome Measure Help needed turning from your back to your side while in a flat bed without using bedrails?: None Help needed moving from lying on your back to sitting on the side of a flat bed without using bedrails?: A Little Help needed moving to and from a bed to a chair (including a wheelchair)?: A Little Help needed standing up from a chair using your arms (e.g., wheelchair or bedside chair)?: A Little Help needed to walk in hospital room?: A Little Help needed climbing 3-5 steps with a railing? : A Lot 6 Click Score: 18    End of Session Equipment Utilized During Treatment: Gait belt Activity Tolerance: Patient tolerated treatment well Patient left: in chair;with call bell/phone within reach;with chair alarm set Nurse Communication: Mobility status PT Visit Diagnosis:  Unsteadiness on feet (R26.81);Difficulty in walking, not elsewhere classified (R26.2)    Time: 7591-6384 PT Time Calculation (min) (ACUTE ONLY): 32 min   Charges:   PT Evaluation $PT Eval Moderate Complexity: 1 Mod          Wyona Almas, PT, DPT Acute Rehabilitation Services Pager [phone] Office [phone]   Deno Etienne 08/15/2020, 2:32 PM

## 2020-08-15 NOTE — Evaluation (Signed)
Occupational Therapy Evaluation Patient Details Name: [name] MRN: [id number] DOB: January 05, 1951 Today's Date: 08/15/2020    History of Present Illness Pt is a 70 y.o. M admitted 3/28 with progressive cognitive decline and ventriculomegaly; neuropsych testing indicative of normal pressure hydrocephalus and pt now s/p lumbar drain placement 3/28. Significant PMH: dementia, CKD stage III, diabetes mellitus, Parkinson's disease.   Clinical Impression   PT admitted with hydrocephalus with ventriculomegaly. Pt currently with functional limitiations due to the deficits listed below (see OT problem list). Pt currently with mild balance deficits noted and safety awareness with RW. No family present to determine baseline or (A) for d/c Pt will benefit from skilled OT to increase their independence and safety with adls and balance to allow discharge [place].     Follow Up Recommendations  Home health OT    Equipment Recommendations       Recommendations for Other Services       Precautions / Restrictions Precautions Precautions: Fall;Other (comment) Precaution Comments: Lumbar drain, clamp prior to mobility Restrictions Weight Bearing Restrictions: No      Mobility Bed Mobility Overal bed mobility: Needs Assistance Bed Mobility: Supine to Sit     Supine to sit: Supervision     General bed mobility comments: Supervision for safety, no physical A required    Transfers Overall transfer level: Needs assistance Equipment used: Rolling walker (2 wheeled) Transfers: Sit to/from Stand Sit to Stand: Min guard;+2 safety/equipment         General transfer comment: Min guard to rise from edge of bed, cues for reciprocal scooting and hand placement for transition    Balance Overall balance assessment: Needs assistance Sitting-balance support: Feet supported Sitting balance-Leahy Scale: Good     Standing balance support: Bilateral upper extremity supported Standing  balance-Leahy Scale: Poor Standing balance comment: reliant on external support                           ADL either performed or assessed with clinical judgement   ADL Overall ADL's : Needs assistance/impaired Eating/Feeding: Set up;Sitting   Grooming: Wash/dry hands;Min guard;Standing                               Functional mobility during ADLs: Minimal assistance;Rolling walker General ADL Comments: Pt noted to bump into objects on R side. pt needs cues to position in the middle of the hallway     Vision Baseline Vision/History: Wears glasses Wears Glasses: Reading only       Perception     Praxis      Pertinent Vitals/Pain Pain Assessment: No/denies pain     Hand Dominance Right   Extremity/Trunk Assessment Upper Extremity Assessment Upper Extremity Assessment: Overall WFL for tasks assessed   Lower Extremity Assessment Lower Extremity Assessment: Defer to PT evaluation   Cervical / Trunk Assessment Cervical / Trunk Assessment: Kyphotic   Communication Communication Communication: No difficulties   Cognition Arousal/Alertness: Awake/alert Behavior During Therapy: WFL for tasks assessed/performed Overall Cognitive Status: History of cognitive impairments - at baseline                                 General Comments: History of dementia and progressive cognitive decline. Pt following all commands, needs max cues for wayfinding/navigation in hallway, decreased STM. pt states I hate that i cant remember  the choice i just made when asked.   General Comments  VSS    Exercises     Shoulder Instructions      Home Living Family/patient expects to be discharged to:: Private residence Living Arrangements: Spouse/significant other Available Help at Discharge: Family Type of Home: House Home Access: Stairs to enter Technical brewer of Steps: 3   Home Layout: One level     Bathroom Shower/Tub: Medical illustrator: Standard     Home Equipment: Environmental consultant - 2 wheels          Prior Functioning/Environment Level of Independence: Needs assistance  Gait / Transfers Assistance Needed: reports using walker prior to admission and needing help from spouse with steps ADL's / Homemaking Assistance Needed: spouse is my "primary care taker" assisted with ADL/IADLs. pt states has been sponge bathing vs transfer into step in shower            OT Problem List: Impaired balance (sitting and/or standing);Decreased cognition;Decreased safety awareness;Decreased knowledge of use of DME or AE;Decreased knowledge of precautions      OT Treatment/Interventions: Self-care/ADL training;DME and/or AE instruction;Therapeutic activities;Patient/family education;Balance training;Cognitive remediation/compensation    OT Goals(Current goals can be found in the care plan section) Acute Rehab OT Goals Patient Stated Goal: return to baseline OT Goal Formulation: With patient Time For Goal Achievement: 08/29/20 Potential to Achieve Goals: Good  OT Frequency: Min 2X/week   Barriers to D/C:            Co-evaluation              AM-PAC OT "6 Clicks" Daily Activity     Outcome Measure Help from another person eating meals?: A Little Help from another person taking care of personal grooming?: A Little Help from another person toileting, which includes using toliet, bedpan, or urinal?: A Little Help from another person bathing (including washing, rinsing, drying)?: A Little Help from another person to put on and taking off regular upper body clothing?: A Little Help from another person to put on and taking off regular lower body clothing?: A Little 6 Click Score: 18   End of Session Equipment Utilized During Treatment: Gait belt;Rolling walker Nurse Communication: Mobility status;Precautions  Activity Tolerance: Patient tolerated treatment well Patient left: in chair;with call bell/phone within  reach;with chair alarm set  OT Visit Diagnosis: Unsteadiness on feet (R26.81);Muscle weakness (generalized) (M62.81)                Time: 9449-6759 OT Time Calculation (min): 28 min Charges:  OT General Charges $OT Visit: 1 Visit OT Evaluation $OT Eval Moderate Complexity: 1 Mod   Brynn, OTR/L  Acute Rehabilitation Services Pager: [phone] Office: [phone] .   Jeri Modena 08/15/2020, 2:55 PM

## 2020-08-15 NOTE — Progress Notes (Signed)
Neurosurgery Service Progress Note  Subjective: No acute events overnight, no subjective change in cognitive symptoms    Objective: Vitals:   08/15/20 0700 08/15/20 0800 08/15/20 0900 08/15/20 1000  BP: 127/71 135/80 127/75 137/61  Pulse: 73 78    Resp: 12 15 18 17   Temp:  (!) 97.5 F (36.4 C)    TempSrc:  Oral    SpO2: 100% 100%    Weight:      Height:        Physical Exam: AOx2, PERRL, EOMI, FS, TM, Strength 5/5 x4, SILTx4 LD in place draining clear fluid  Assessment & Plan: 70 y.o. man s/p LD placement for NPH trial, recovering well.  -PT/OT today, will see how he responds, likely d/c LD tomorrow -continue draining 10cc/hr, but can clamp when patient is ambulating or working with PT/OT  Judith Part  08/15/20 11:28 AM

## 2020-08-16 NOTE — Progress Notes (Signed)
Neurosurgery Service Progress Note  Subjective: No acute events overnight, no subjective change in cognitive symptoms, feels his balance is the same to before drain placement  Objective: Vitals:   08/16/20 0400 08/16/20 0500 08/16/20 0600 08/16/20 0700  BP: (!) 151/88 122/73 133/81 131/75  Pulse: 82 75 77 73  Resp: 13 13 10  (!) 9  Temp: 98.1 F (36.7 C)     TempSrc: Oral     SpO2: 100% 100% 100% 100%  Weight:      Height:        Physical Exam: AOx2, PERRL, EOMI, FS, TM, Strength 5/5 x4, SILTx4 LD in place draining clear fluid  Assessment & Plan: 70 y.o. man s/p LD placement for NPH trial, recovering well.  -PT/OT repeat eval today -continue draining 10cc/hr, but can clamp when patient is ambulating or working with PT/OT -will likely d/c drain tomorrow and discharge home  Judith Part  08/16/20 8:51 AM

## 2020-08-16 NOTE — Progress Notes (Signed)
Occupational Therapy Treatment Patient Details Name: [name] MRN: [id number] DOB: Mar 30, 1951 Today's Date: 08/16/2020    History of present illness Pt is a 70 y.o. M admitted 3/28 with progressive cognitive decline and ventriculomegaly; neuropsych testing indicative of normal pressure hydrocephalus and pt now s/p lumbar drain placement 3/28. Significant PMH: dementia, CKD stage III, diabetes mellitus, Parkinson's disease.   OT comments  Pt tolerated grooming / bathing task at sink this session setup level. OT to see patient one more time to assess balance with adls after drain is clamped for longer period of time. Recommendation HHOT for d/c home. Pt demonstrates poor recall of sequence completed during bathing and needing redirection at times.   Follow Up Recommendations  Home health OT    Equipment Recommendations  None recommended by OT    Recommendations for Other Services      Precautions / Restrictions Precautions Precautions: Fall;Other (comment) Precaution Comments: Lumbar drain, clamp prior to mobility       Mobility Bed Mobility Overal bed mobility: Needs Assistance Bed Mobility: Supine to Sit     Supine to sit: Supervision          Transfers Overall transfer level: Needs assistance Equipment used: Rolling walker (2 wheeled) Transfers: Sit to/from Stand Sit to Stand: Min guard              Balance Overall balance assessment: Needs assistance Sitting-balance support: Feet supported Sitting balance-Leahy Scale: Good     Standing balance support: Bilateral upper extremity supported Standing balance-Leahy Scale: Fair Standing balance comment: requires UE support                           ADL either performed or assessed with clinical judgement   ADL Overall ADL's : Needs assistance/impaired Eating/Feeding: Set up   Grooming: Applying deodorant Grooming Details (indicate cue type and reason): requires sitting due to balance.  pt verbalized that he is doing it different here then his wife tells him to do. She has rules about one wash cloth because she isnt doing all the laundry and not runnign the water too long. pt encouraged to use more than one cloth for hygiene this session Upper Body Bathing: Set up;Sitting   Lower Body Bathing: Minimal assistance;Sit to/from stand   Upper Body Dressing : Minimal assistance;Sitting   Lower Body Dressing: Min guard;Sitting/lateral leans Lower Body Dressing Details (indicate cue type and reason): don doff socks only Toilet Transfer: Min guard;RW           Functional mobility during ADLs: Min guard;Rolling walker General ADL Comments: pt progressed to sink level bathing     Vision       Perception     Praxis      Cognition Arousal/Alertness: Awake/alert Behavior During Therapy: WFL for tasks assessed/performed Overall Cognitive Status: History of cognitive impairments - at baseline                                 General Comments: pt was unable to recall which body parts he had washed due to extensive time spent washing other areas. pt needed redirection not to wash peri area again.        Exercises     Shoulder Instructions       General Comments VSS    Pertinent Vitals/ Pain       Pain Assessment: No/denies pain  Home  Living                                          Prior Functioning/Environment              Frequency  Min 2X/week        Progress Toward Goals  OT Goals(current goals can now be found in the care plan section)  Progress towards OT goals: Progressing toward goals  Acute Rehab OT Goals Patient Stated Goal: to go back home OT Goal Formulation: With patient Time For Goal Achievement: 08/29/20 Potential to Achieve Goals: Good ADL Goals Pt Will Perform Upper Body Dressing: with supervision;sitting Pt Will Perform Lower Body Dressing: with supervision;sit to/from stand Pt Will Transfer  to Toilet: with supervision;ambulating;regular height toilet  Plan Discharge plan remains appropriate    Co-evaluation                 AM-PAC OT "6 Clicks" Daily Activity     Outcome Measure   Help from another person eating meals?: A Little Help from another person taking care of personal grooming?: A Little Help from another person toileting, which includes using toliet, bedpan, or urinal?: A Little Help from another person bathing (including washing, rinsing, drying)?: A Little Help from another person to put on and taking off regular upper body clothing?: A Little Help from another person to put on and taking off regular lower body clothing?: A Little 6 Click Score: 18    End of Session Equipment Utilized During Treatment: Rolling walker  OT Visit Diagnosis: Unsteadiness on feet (R26.81);Muscle weakness (generalized) (M62.81)   Activity Tolerance Patient tolerated treatment well   Patient Left in chair;with call bell/phone within reach;with chair alarm set   Nurse Communication Mobility status;Precautions        Time: 2023-3435 OT Time Calculation (min): 43 min  Charges: OT General Charges $OT Visit: 1 Visit OT Treatments $Self Care/Home Management : 38-52 mins   Brynn, OTR/L  Acute Rehabilitation Services Pager: [phone] Office: [phone] .    Jeri Modena 08/16/2020, 11:06 AM

## 2020-08-16 NOTE — Progress Notes (Signed)
Physical Therapy Treatment Patient Details Name: [name] MRN: [id number] DOB: August 31, 1950 Today's Date: 08/16/2020    History of Present Illness Pt is a 70 y.o. M admitted 3/28 with progressive cognitive decline and ventriculomegaly; neuropsych testing indicative of normal pressure hydrocephalus and pt now s/p lumbar drain placement 3/28. Significant PMH: dementia, CKD stage III, diabetes mellitus, Parkinson's disease.    PT Comments    Pt demonstrating improvement in activity tolerance and ambulation distance today. Ambulated x 250 feet with a walker at a min guard assist level. Performed bilateral manual gastroc stretch following. D/c plan remains appropriate.     Follow Up Recommendations  Home health PT;Supervision/Assistance - 24 hour     Equipment Recommendations  None recommended by PT    Recommendations for Other Services       Precautions / Restrictions Precautions Precautions: Fall;Other (comment) Precaution Comments: Lumbar drain, clamp prior to mobility Restrictions Weight Bearing Restrictions: No    Mobility  Bed Mobility Overal bed mobility: Needs Assistance Bed Mobility: Supine to Sit     Supine to sit: Supervision     General bed mobility comments: OOB in chair    Transfers Overall transfer level: Needs assistance Equipment used: Rolling walker (2 wheeled) Transfers: Sit to/from Stand Sit to Stand: Min guard            Ambulation/Gait Ambulation/Gait assistance: Min guard Gait Distance (Feet): 250 Feet Assistive device: Rolling walker (2 wheeled) Gait Pattern/deviations: Step-through pattern;Decreased stride length;Trunk flexed Gait velocity: decreased   General Gait Details: Cues for environmental negotiation, min guard for safety. No gross LOB   Stairs             Wheelchair Mobility    Modified Rankin (Stroke Patients Only)       Balance Overall balance assessment: Needs assistance Sitting-balance support:  Feet supported Sitting balance-Leahy Scale: Good     Standing balance support: Bilateral upper extremity supported Standing balance-Leahy Scale: Fair Standing balance comment: requires UE support                            Cognition Arousal/Alertness: Awake/alert Behavior During Therapy: WFL for tasks assessed/performed Overall Cognitive Status: History of cognitive impairments - at baseline                                 General Comments: pt was unable to recall which body parts he had washed due to extensive time spent washing other areas. pt needed redirection not to wash peri area again.      Exercises Other Exercises Other Exercises: Sitting: manual gastroc stretch x 1 minute each    General Comments General comments (skin integrity, edema, etc.): VSS      Pertinent Vitals/Pain Pain Assessment: No/denies pain    Home Living                      Prior Function            PT Goals (current goals can now be found in the care plan section) Acute Rehab PT Goals Patient Stated Goal: to go back home Potential to Achieve Goals: Good Progress towards PT goals: Progressing toward goals    Frequency    Min 3X/week      PT Plan Current plan remains appropriate    Co-evaluation  AM-PAC PT "6 Clicks" Mobility   Outcome Measure  Help needed turning from your back to your side while in a flat bed without using bedrails?: None Help needed moving from lying on your back to sitting on the side of a flat bed without using bedrails?: None Help needed moving to and from a bed to a chair (including a wheelchair)?: A Little Help needed standing up from a chair using your arms (e.g., wheelchair or bedside chair)?: A Little Help needed to walk in hospital room?: A Little Help needed climbing 3-5 steps with a railing? : A Little 6 Click Score: 20    End of Session Equipment Utilized During Treatment: Gait belt Activity  Tolerance: Patient tolerated treatment well Patient left: in chair;with call bell/phone within reach;with chair alarm set Nurse Communication: Mobility status PT Visit Diagnosis: Unsteadiness on feet (R26.81);Difficulty in walking, not elsewhere classified (R26.2)     Time: 2725-3664 PT Time Calculation (min) (ACUTE ONLY): 20 min  Charges:  $Therapeutic Activity: 8-22 mins                     Wyona Almas, PT, DPT Acute Rehabilitation Services Pager [phone] Office [phone]    Deno Etienne 08/16/2020, 12:59 PM

## 2020-08-17 MED ORDER — LIDOCAINE HCL (PF) 1 % IJ SOLN
INTRAMUSCULAR | Status: AC
  Filled 2020-08-17: qty 5

## 2020-08-17 NOTE — Progress Notes (Signed)
Neurosurgery Service Progress Note  Subjective: No acute events overnight, no subjective change in cognitive symptoms, feels his balance and cognition are the same to before drain placement  Objective: Vitals:   08/17/20 0500 08/17/20 0600 08/17/20 0700 08/17/20 0800  BP: 130/84 111/71 127/84 112/63  Pulse:      Resp: 14 12 (!) 8 18  Temp:    98.4 F (36.9 C)  TempSrc:    Oral  SpO2:      Weight:      Height:        Physical Exam: AOx2, PERRL, EOMI, FS, TM, Strength 5/5 x4, SILTx4 LD in place draining clear fluid  Assessment & Plan: 70 y.o. man s/p LD placement for NPH trial s/p 48h of drainage with no improvement in Sx. 3/31 LD removed  -LD removed at bedside this morning -will d/w pt's wife, plan for discharge this afternoon w/ home health PT  Judith Part  08/17/20 9:47 AM

## 2020-08-17 NOTE — Progress Notes (Signed)
Discharge information reviewed with patient and wife, all questions answered. Dr Zada Finders reviewed information and answered wife's questions via phone. IVs removed. Patient wheeled to front and discharged home with wife.

## 2020-08-17 NOTE — TOC Initial Note (Signed)
Transition of Care [facility]) - Initial/Assessment Note    Patient Details  Name: [name] MRN: [id number] Date of Birth: [birth year]  [facility]) CM/SW Contact:    Ella Bodo, RN Phone Number: 08/17/2020, [phone]   Clinical Narrative:  Pt is a 70 y.o. M admitted 3/28 with progressive cognitive decline and ventriculomegaly; neuropsych testing indicative of normal pressure hydrocephalus and pt now s/p lumbar drain placement 3/28.  Prior to admission, patient required assistance with ADLs, and ambulates with a walker.  Conversation by phone had with patient's wife, [name]: She states that she and patient are currently living in a motel, the [employer] in [place].  She states they have been living here since February, when they were evicted from their home.  She is open to having home health visit patient for continued therapies, as recommended.  Would recommend adding clinical social worker to home health to assist with housing issues.  Wife states patient has rolling walker and cane at home.  Referral to [facility] for follow-up.               Discharge address is [address] [place], [place] [address], [address].    Expected Discharge Plan: [place] Barriers to Discharge: Barriers Resolved   Patient Goals and CMS Choice   CMS Medicare.gov Compare Post Acute Care list provided to:: Patient Represenative (must comment) (wife) Choice offered to / list presented to : Spouse  Expected Discharge Plan and Services Expected Discharge Plan: [place]   Discharge Planning Services: CM Consult Post Acute Care Choice: [place] arrangements for the past 2 months: Hotel/Motel                           HH Arranged: PT,OT,Social Work [employer] Agency: [place] Date [place]: 08/17/20 Time HH Agency Contacted: [place] Representative spoke with at [place]: Adela Lank  Prior Living  Arrangements/Services Living arrangements for the past 2 months: Hotel/Motel Lives with:: Spouse Patient language and need for interpreter reviewed:: Yes Do you feel safe going back to the place where you live?: Yes      Need for Family Participation in Patient Care: Yes (Comment) Care giver support system in place?: Yes (comment) Current home services: DME (RW, cane) Criminal Activity/Legal Involvement Pertinent to Current Situation/Hospitalization: No - Comment as needed  Activities of Daily Living Home Assistive Devices/Equipment: Eyeglasses,Cane (specify quad or straight) ADL Screening (condition at time of admission) Patient's cognitive ability adequate to safely complete daily activities?: No Is the patient deaf or have difficulty hearing?: No Does the patient have difficulty seeing, even when wearing glasses/contacts?: No Does the patient have difficulty concentrating, remembering, or making decisions?: Yes Patient able to express need for assistance with ADLs?: Yes Does the patient have difficulty dressing or bathing?: No Independently performs ADLs?: Yes (appropriate for developmental age) Does the patient have difficulty walking or climbing stairs?: Yes Weakness of Legs: Both Weakness of Arms/Hands: None  Permission Sought/Granted Permission sought to share information with : Family Supports Permission granted to share information with : Yes, Verbal Permission Granted  Share Information with NAME: Tejuan Gholson  Permission granted to share info w AGENCY: [place] granted to share info w Relationship: wife  Permission granted to share info w Contact Information: [phone]  Emotional Assessment Appearance:: Appears stated age Attitude/Demeanor/Rapport: Engaged Affect (typically observed): Accepting Orientation: :  Oriented to Self,Oriented to Place,Oriented to  Time,Oriented to Situation      Admission diagnosis:  Normal pressure hydrocephalus ([place])  [G91.2] Patient Active Problem List   Diagnosis Date Noted  . Normal pressure hydrocephalus ([place]) 08/14/2020  . GERD (gastroesophageal reflux disease) 06/28/2020  . Malnutrition of moderate degree 06/27/2020  . Type 2 diabetes mellitus with hyperglycemia ([place]) 06/25/2020  . Lobar pneumonia ([place]) 06/25/2020  . Fall at home, initial encounter 06/25/2020  . Cognitive impairment 06/25/2020  . Sepsis secondary to UTI ([place]) 06/24/2020  . Barrett esophagus   . Mild protein malnutrition ([place])   . Diabetes mellitus ([place])   . Hypertension   . Chronic kidney disease (CKD), stage III (moderate) (HCC)    PCP:  Jani Gravel, MD Pharmacy:   [facility], [place] [address] Phone: [phone] Fax: [phone]     Social Determinants of Health (SDOH) Interventions    Readmission Risk Interventions Readmission Risk Prevention Plan 06/27/2020  Post Dischage Appt Complete  Medication Screening Complete  Transportation Screening Complete  Some recent data might be hidden   Reinaldo Raddle, RN, BSN  Trauma/Neuro ICU Case Manager [phone]

## 2020-08-17 NOTE — Discharge Instructions (Signed)
Discharge Instructions  No restriction in activities, slowly increase your activity back to normal.   On your low back, there is a suture that is located where your drain was placed. This is an absorbable suture and will dissolve on its own, it doesn't have to be removed.   Okay to shower on the day of discharge. Use regular soap and water and try to be gentle when cleaning your incision.   Follow up with Dr. Zada Finders in 2 weeks after discharge. If you do not already have a discharge appointment, please call his office at [phone] to schedule a follow up appointment. If you have any concerns or questions, please call the office and let us know.

## 2020-08-17 NOTE — Discharge Summary (Signed)
Discharge Summary  Date of Admission: 08/14/2020  Date of Discharge: 08/17/20  Attending Physician: Emelda Brothers, MD  Hospital Course: Patient was taken to the OR for placement of a lumbar drain for an NPH trial. He did not feel a significant difference in gait, but his wife noticed a significant improvement in his cognition. He was much more talkative and interactive, so I would consider this a positive trial. His drain was removed on 08/17/20, home health was arranged to help him recover, his hospital course was uncomplicated and the patient was discharged home on 3/31. He will follow up in clinic with me in 2 weeks.  Neurologic exam at discharge:  AOx2, PERRL, EOMI, FS, TM  Strength 5/5 x4, SILTx4  Discharge diagnosis: Normal pressure hydrocephalus  Ronald Part, MD 08/17/20 4:32 PM

## 2020-08-17 NOTE — Progress Notes (Signed)
Wife at bedside, no documented legal guardian, discharge reviewed with patient and wife.

## 2020-08-17 NOTE — Evaluation (Signed)
Occupational Therapy Evaluation Patient Details Name: [name] MRN: [id number] DOB: [birth year] Today's Date: 08/17/2020    History of Present Illness 70 y.o. M admitted 3/28 with progressive cognitive decline and ventriculomegaly; neuropsych testing indicative of normal pressure hydrocephalus and pt now s/p lumbar drain placement 3/28. Significant PMH: dementia, CKD stage III, diabetes mellitus, Parkinson's disease.   Clinical Impression   Pt progressing towards established OT goals. Pt performing functional mobility in hallway (to simulate home distances) with Supervision-Min guard A and RW. Pt requiring increased time for processing but maintaining appropriate conversation. Continue to present with decreased strength, balance, and activity tolerance. Continue to recommend dc to home with HHOT and will continue to follow acutely as admitted.     Follow Up Recommendations  Home health OT    Equipment Recommendations  None recommended by OT    Recommendations for Other Services       Precautions / Restrictions Precautions Precautions: Fall;Other (comment) Precaution Comments: Lumbar drain, clamp prior to mobility      Mobility Bed Mobility               General bed mobility comments: OOB in chair    Transfers Overall transfer level: Needs assistance Equipment used: Rolling walker (2 wheeled) Transfers: Sit to/from Stand Sit to Stand: Min guard         General transfer comment: Min Guard A for safety    Balance Overall balance assessment: Needs assistance Sitting-balance support: Feet supported Sitting balance-Leahy Scale: Good     Standing balance support: Bilateral upper extremity supported Standing balance-Leahy Scale: Fair Standing balance comment: requires UE support                           ADL either performed or assessed with clinical judgement   ADL Overall ADL's : Needs assistance/impaired                          Toilet Transfer: Min guard;Ambulation;RW (simulated to recliner) Toilet Transfer Details (indicate cue type and reason): Min Guard A for safety         Functional mobility during ADLs: Min guard;Rolling walker;Supervision/safety General ADL Comments: Pt requesting to perform functional mobility in hallway. Pt performing with MIn Guard A-Supervision and RW. Pt able to recall his room number and locate it once walking near it.     Vision         Perception     Praxis      Pertinent Vitals/Pain Pain Assessment: No/denies pain     Hand Dominance     Extremity/Trunk Assessment Upper Extremity Assessment Upper Extremity Assessment: Overall WFL for tasks assessed   Lower Extremity Assessment Lower Extremity Assessment: Defer to PT evaluation       Communication     Cognition Arousal/Alertness: Awake/alert Behavior During Therapy: WFL for tasks assessed/performed Overall Cognitive Status: History of cognitive impairments - at baseline                                 General Comments: Requringi ncreased time and cues   General Comments  VSS on RA    Exercises     Shoulder Instructions      Home Living  Prior Functioning/Environment                   OT Problem List:        OT Treatment/Interventions:      OT Goals(Current goals can be found in the care plan section) Acute Rehab OT Goals Patient Stated Goal: to go back home OT Goal Formulation: With patient Time For Goal Achievement: 08/29/20 Potential to Achieve Goals: Good ADL Goals Pt Will Perform Upper Body Dressing: with supervision;sitting Pt Will Perform Lower Body Dressing: with supervision;sit to/from stand Pt Will Transfer to Toilet: with supervision;ambulating;regular height toilet  OT Frequency: Min 2X/week   Barriers to D/C:            Co-evaluation              AM-PAC OT "6 Clicks" Daily Activity      Outcome Measure Help from another person eating meals?: A Little Help from another person taking care of personal grooming?: A Little Help from another person toileting, which includes using toliet, bedpan, or urinal?: A Little Help from another person bathing (including washing, rinsing, drying)?: A Little Help from another person to put on and taking off regular upper body clothing?: A Little Help from another person to put on and taking off regular lower body clothing?: A Little 6 Click Score: 18   End of Session Equipment Utilized During Treatment: Rolling walker;Gait belt Nurse Communication: Mobility status;Precautions  Activity Tolerance: Patient tolerated treatment well Patient left: in chair;with call bell/phone within reach;with chair alarm set  OT Visit Diagnosis: Unsteadiness on feet (R26.81);Muscle weakness (generalized) (M62.81)                Time: 9629-5284 OT Time Calculation (min): 29 min Charges:  OT General Charges $OT Visit: 1 Visit OT Treatments $Self Care/Home Management : 23-37 mins  [place], OTR/L Acute Rehab Pager: [phone] Office: [place] 08/17/2020, 4:46 PM

## 2020-09-06 ENCOUNTER — Other Ambulatory Visit: Payer: Self-pay | Admitting: Neurological Surgery

## 2020-09-08 ENCOUNTER — Other Ambulatory Visit (HOSPITAL_COMMUNITY)
Admission: RE | Admit: 2020-09-08 | Discharge: 2020-09-08 | Disposition: A | Discharge status: Home / Self Care | Payer: Medicare PPO | Source: Ambulatory Visit | Attending: Neurological Surgery | Admitting: Neurological Surgery

## 2020-09-08 ENCOUNTER — Encounter (HOSPITAL_COMMUNITY): Payer: Self-pay | Admitting: Neurological Surgery

## 2020-09-08 DIAGNOSIS — Z01812 Encounter for preprocedural laboratory examination: Secondary | ICD-10-CM | POA: Insufficient documentation

## 2020-09-08 DIAGNOSIS — Z20822 Contact with and (suspected) exposure to covid-19: Secondary | ICD-10-CM | POA: Diagnosis not present

## 2020-09-08 NOTE — Progress Notes (Addendum)
EKG: 06/26/20 CXR: na ECHO: denies Stress Test: denies Cardiac Cath: denies  Fasting Blood Sugar-Does not have a glucometer Checks Blood Sugar__0_ times a day  OSA: Yes CPAP: No, does not wear  ASA/Blood Thinners: No  Covid test 09/08/20 pending  Anesthesia Review: No  Patient denies shortness of breath, fever, cough, and chest pain at PAT appointment.  Patient verbalized understanding of instructions provided today at the PAT appointment.  Patient asked to review instructions at home and day of surgery.

## 2020-09-09 LAB — SARS CORONAVIRUS 2 (TAT 6-24 HRS): SARS Coronavirus 2: NEGATIVE

## 2020-09-11 ENCOUNTER — Ambulatory Visit (HOSPITAL_COMMUNITY): Payer: Medicare PPO | Admitting: Registered Nurse

## 2020-09-11 ENCOUNTER — Other Ambulatory Visit: Payer: Self-pay

## 2020-09-11 ENCOUNTER — Ambulatory Visit (HOSPITAL_COMMUNITY): Payer: Medicare PPO

## 2020-09-11 ENCOUNTER — Encounter (HOSPITAL_COMMUNITY)
Admission: RE | Disposition: A | Discharge status: Home / Self Care | Payer: Self-pay | Source: Home / Self Care | Attending: Neurological Surgery

## 2020-09-11 ENCOUNTER — Encounter (HOSPITAL_COMMUNITY): Payer: Self-pay | Admitting: Neurological Surgery

## 2020-09-11 ENCOUNTER — Observation Stay (HOSPITAL_COMMUNITY)
Admission: RE | Admit: 2020-09-11 | Discharge: 2020-09-12 | Disposition: A | Discharge status: Home / Self Care | Payer: Medicare PPO | Attending: Neurological Surgery | Admitting: Neurological Surgery

## 2020-09-11 DIAGNOSIS — G912 (Idiopathic) normal pressure hydrocephalus: Principal | ICD-10-CM | POA: Diagnosis present

## 2020-09-11 DIAGNOSIS — N183 Chronic kidney disease, stage 3 unspecified: Secondary | ICD-10-CM | POA: Insufficient documentation

## 2020-09-11 DIAGNOSIS — E1122 Type 2 diabetes mellitus with diabetic chronic kidney disease: Secondary | ICD-10-CM | POA: Diagnosis not present

## 2020-09-11 DIAGNOSIS — I10 Essential (primary) hypertension: Secondary | ICD-10-CM | POA: Diagnosis not present

## 2020-09-11 DIAGNOSIS — Z419 Encounter for procedure for purposes other than remedying health state, unspecified: Secondary | ICD-10-CM

## 2020-09-11 DIAGNOSIS — Z8501 Personal history of malignant neoplasm of esophagus: Secondary | ICD-10-CM | POA: Diagnosis not present

## 2020-09-11 DIAGNOSIS — I129 Hypertensive chronic kidney disease with stage 1 through stage 4 chronic kidney disease, or unspecified chronic kidney disease: Secondary | ICD-10-CM | POA: Insufficient documentation

## 2020-09-11 DIAGNOSIS — E1165 Type 2 diabetes mellitus with hyperglycemia: Secondary | ICD-10-CM | POA: Diagnosis not present

## 2020-09-11 DIAGNOSIS — R2681 Unsteadiness on feet: Secondary | ICD-10-CM | POA: Diagnosis not present

## 2020-09-11 DIAGNOSIS — Z7984 Long term (current) use of oral hypoglycemic drugs: Secondary | ICD-10-CM | POA: Diagnosis not present

## 2020-09-11 HISTORY — PX: SHUNT REPLACEMENT: SHX5403

## 2020-09-11 LAB — BASIC METABOLIC PANEL
Anion gap: 11 (ref 5–15)
BUN: 22 mg/dL (ref 8–23)
CO2: 27 mmol/L (ref 22–32)
Calcium: 8.8 mg/dL — ABNORMAL LOW (ref 8.9–10.3)
Chloride: 101 mmol/L (ref 98–111)
Creatinine, Ser: 1.34 mg/dL — ABNORMAL HIGH (ref 0.61–1.24)
GFR, Estimated: 57 mL/min — ABNORMAL LOW (ref >60)
Glucose, Bld: 123 mg/dL — ABNORMAL HIGH (ref 70–99)
Potassium: 4.4 mmol/L (ref 3.5–5.1)
Sodium: 139 mmol/L (ref 135–145)

## 2020-09-11 LAB — CBC
HCT: 35.8 % — ABNORMAL LOW (ref 39.0–52.0)
Hemoglobin: 11.3 g/dL — ABNORMAL LOW (ref 13.0–17.0)
MCH: 30.1 pg (ref 26.0–34.0)
MCHC: 31.6 g/dL (ref 30.0–36.0)
MCV: 95.5 fL (ref 80.0–100.0)
Platelets: 195 10*3/uL (ref 150–400)
RBC: 3.75 MIL/uL — ABNORMAL LOW (ref 4.22–5.81)
RDW: 13.4 % (ref 11.5–15.5)
WBC: 6.3 10*3/uL (ref 4.0–10.5)
nRBC: 0 % (ref 0.0–0.2)

## 2020-09-11 LAB — GLUCOSE, CAPILLARY
Glucose-Capillary: 69 mg/dL — ABNORMAL LOW (ref 70–99)
Glucose-Capillary: 65 mg/dL — ABNORMAL LOW (ref 70–99)
Glucose-Capillary: 89 mg/dL (ref 70–99)
Glucose-Capillary: 144 mg/dL — ABNORMAL HIGH (ref 70–99)
Glucose-Capillary: 165 mg/dL — ABNORMAL HIGH (ref 70–99)
Glucose-Capillary: 277 mg/dL — ABNORMAL HIGH (ref 70–99)

## 2020-09-11 SURGERY — SHUNT REPLACEMENT
Anesthesia: General | Site: Back

## 2020-09-11 MED ORDER — LISINOPRIL 10 MG PO TABS
10.0000 mg | ORAL_TABLET | Freq: Every day | ORAL | Status: DC
  Administered 2020-09-11 – 2020-09-12 (×2): 10 mg via ORAL
  Filled 2020-09-11 (×2): qty 1

## 2020-09-11 MED ORDER — HYDROMORPHONE HCL 1 MG/ML IJ SOLN: 1.0000 mg | INTRAMUSCULAR | Status: DC | PRN

## 2020-09-11 MED ORDER — INSULIN ASPART 100 UNIT/ML ~~LOC~~ SOLN: 0.0000 [IU] | Freq: Three times a day (TID) | SUBCUTANEOUS | Status: DC

## 2020-09-11 MED ORDER — DEXTROSE 50 % IV SOLN
25.0000 mL | Freq: Once | INTRAVENOUS | Status: AC
  Filled 2020-09-11: qty 50

## 2020-09-11 MED ORDER — FUSION PLUS PO CAPS: 1.0000 | ORAL_CAPSULE | Freq: Every day | ORAL | Status: DC

## 2020-09-11 MED ORDER — CEFAZOLIN SODIUM-DEXTROSE 2-4 GM/100ML-% IV SOLN
2.0000 g | Freq: Three times a day (TID) | INTRAVENOUS | Status: AC
  Administered 2020-09-11 – 2020-09-12 (×2): 2 g via INTRAVENOUS
  Filled 2020-09-11 (×2): qty 100

## 2020-09-11 MED ORDER — PHENYLEPHRINE 40 MCG/ML (10ML) SYRINGE FOR IV PUSH (FOR BLOOD PRESSURE SUPPORT)
PREFILLED_SYRINGE | INTRAVENOUS | Status: AC
  Filled 2020-09-11: qty 10

## 2020-09-11 MED ORDER — SODIUM CHLORIDE 0.9% FLUSH: 3.0000 mL | INTRAVENOUS | Status: DC | PRN

## 2020-09-11 MED ORDER — SODIUM CHLORIDE 0.9% FLUSH: 3.0000 mL | Freq: Two times a day (BID) | INTRAVENOUS | Status: DC

## 2020-09-11 MED ORDER — ONDANSETRON HCL 4 MG PO TABS: 4.0000 mg | ORAL_TABLET | Freq: Four times a day (QID) | ORAL | Status: DC | PRN

## 2020-09-11 MED ORDER — OXYCODONE HCL 5 MG PO TABS: 10.0000 mg | ORAL_TABLET | ORAL | Status: DC | PRN

## 2020-09-11 MED ORDER — AMISULPRIDE (ANTIEMETIC) 5 MG/2ML IV SOLN: 10.0000 mg | Freq: Once | INTRAVENOUS | Status: DC | PRN

## 2020-09-11 MED ORDER — ONDANSETRON HCL 4 MG/2ML IJ SOLN
INTRAMUSCULAR | Status: DC | PRN
  Administered 2020-09-11: 4 mg via INTRAVENOUS

## 2020-09-11 MED ORDER — ACETAMINOPHEN 10 MG/ML IV SOLN
INTRAVENOUS | Status: AC
  Filled 2020-09-11: qty 100

## 2020-09-11 MED ORDER — DEXTROSE 50 % IV SOLN
25.0000 mL | Freq: Once | INTRAVENOUS | Status: DC
  Filled 2020-09-11: qty 50

## 2020-09-11 MED ORDER — 0.9 % SODIUM CHLORIDE (POUR BTL) OPTIME
TOPICAL | Status: DC | PRN
  Administered 2020-09-11: 1000 mL

## 2020-09-11 MED ORDER — CHLORHEXIDINE GLUCONATE CLOTH 2 % EX PADS: 6.0000 | MEDICATED_PAD | Freq: Once | CUTANEOUS | Status: DC

## 2020-09-11 MED ORDER — ONDANSETRON HCL 4 MG/2ML IJ SOLN
INTRAMUSCULAR | Status: AC
  Filled 2020-09-11: qty 2

## 2020-09-11 MED ORDER — FENTANYL CITRATE (PF) 250 MCG/5ML IJ SOLN
INTRAMUSCULAR | Status: DC | PRN
  Administered 2020-09-11: 100 ug via INTRAVENOUS
  Administered 2020-09-11: 25 ug via INTRAVENOUS
  Administered 2020-09-11: 50 ug via INTRAVENOUS
  Administered 2020-09-11: 25 ug via INTRAVENOUS

## 2020-09-11 MED ORDER — PHENOL 1.4 % MT LIQD: 1.0000 | OROMUCOSAL | Status: DC | PRN

## 2020-09-11 MED ORDER — PANTOPRAZOLE SODIUM 40 MG PO TBEC
40.0000 mg | DELAYED_RELEASE_TABLET | Freq: Every day | ORAL | Status: DC
  Administered 2020-09-12: 40 mg via ORAL
  Filled 2020-09-11: qty 1

## 2020-09-11 MED ORDER — LACTATED RINGERS IV SOLN: INTRAVENOUS | Status: DC

## 2020-09-11 MED ORDER — ACETAMINOPHEN 650 MG RE SUPP: 650.0000 mg | RECTAL | Status: DC | PRN

## 2020-09-11 MED ORDER — OXYCODONE HCL 5 MG PO TABS
5.0000 mg | ORAL_TABLET | ORAL | Status: DC | PRN
  Administered 2020-09-11: 5 mg via ORAL
  Filled 2020-09-11: qty 1

## 2020-09-11 MED ORDER — DEXTROSE 50 % IV SOLN
INTRAVENOUS | Status: AC
  Administered 2020-09-11: 25 mL via INTRAVENOUS
  Filled 2020-09-11: qty 50

## 2020-09-11 MED ORDER — METFORMIN HCL 500 MG PO TABS
1000.0000 mg | ORAL_TABLET | Freq: Two times a day (BID) | ORAL | Status: DC
  Administered 2020-09-11 – 2020-09-12 (×2): 1000 mg via ORAL
  Filled 2020-09-11 (×2): qty 2

## 2020-09-11 MED ORDER — HYDROCHLOROTHIAZIDE 12.5 MG PO CAPS
12.5000 mg | ORAL_CAPSULE | Freq: Every day | ORAL | Status: DC
  Administered 2020-09-11 – 2020-09-12 (×2): 12.5 mg via ORAL
  Filled 2020-09-11 (×2): qty 1

## 2020-09-11 MED ORDER — QUINAPRIL-HYDROCHLOROTHIAZIDE 10-12.5 MG PO TABS: 1.0000 | ORAL_TABLET | Freq: Every day | ORAL | Status: DC

## 2020-09-11 MED ORDER — CYCLOBENZAPRINE HCL 10 MG PO TABS: 10.0000 mg | ORAL_TABLET | Freq: Three times a day (TID) | ORAL | Status: DC | PRN

## 2020-09-11 MED ORDER — ROCURONIUM BROMIDE 10 MG/ML (PF) SYRINGE
PREFILLED_SYRINGE | INTRAVENOUS | Status: DC | PRN
  Administered 2020-09-11: 50 mg via INTRAVENOUS

## 2020-09-11 MED ORDER — DOCUSATE SODIUM 100 MG PO CAPS
100.0000 mg | ORAL_CAPSULE | Freq: Two times a day (BID) | ORAL | Status: DC
  Administered 2020-09-11 – 2020-09-12 (×2): 100 mg via ORAL
  Filled 2020-09-11 (×2): qty 1

## 2020-09-11 MED ORDER — POLYETHYLENE GLYCOL 3350 17 G PO PACK: 17.0000 g | PACK | Freq: Every day | ORAL | Status: DC | PRN

## 2020-09-11 MED ORDER — PROPOFOL 10 MG/ML IV BOLUS
INTRAVENOUS | Status: AC
  Filled 2020-09-11: qty 20

## 2020-09-11 MED ORDER — CEFAZOLIN SODIUM-DEXTROSE 2-4 GM/100ML-% IV SOLN
2.0000 g | INTRAVENOUS | Status: AC
  Administered 2020-09-11: 2 g via INTRAVENOUS
  Filled 2020-09-11: qty 100

## 2020-09-11 MED ORDER — LIDOCAINE 2% (20 MG/ML) 5 ML SYRINGE
INTRAMUSCULAR | Status: DC | PRN
  Administered 2020-09-11: 60 mg via INTRAVENOUS

## 2020-09-11 MED ORDER — LIDOCAINE 2% (20 MG/ML) 5 ML SYRINGE
INTRAMUSCULAR | Status: AC
  Filled 2020-09-11: qty 5

## 2020-09-11 MED ORDER — ACETAMINOPHEN 10 MG/ML IV SOLN
1000.0000 mg | Freq: Once | INTRAVENOUS | Status: DC | PRN
  Administered 2020-09-11: 1000 mg via INTRAVENOUS

## 2020-09-11 MED ORDER — FE FUMARATE-B12-VIT C-FA-IFC PO CAPS
1.0000 | ORAL_CAPSULE | Freq: Every day | ORAL | Status: DC
  Administered 2020-09-11 – 2020-09-12 (×2): 1 via ORAL
  Filled 2020-09-11 (×2): qty 1

## 2020-09-11 MED ORDER — PHENYLEPHRINE 40 MCG/ML (10ML) SYRINGE FOR IV PUSH (FOR BLOOD PRESSURE SUPPORT)
PREFILLED_SYRINGE | INTRAVENOUS | Status: DC | PRN
  Administered 2020-09-11 (×6): 80 ug via INTRAVENOUS

## 2020-09-11 MED ORDER — LIDOCAINE-EPINEPHRINE 1 %-1:100000 IJ SOLN
INTRAMUSCULAR | Status: DC | PRN
  Administered 2020-09-11: 10 mL

## 2020-09-11 MED ORDER — FENTANYL CITRATE (PF) 100 MCG/2ML IJ SOLN: 25.0000 ug | INTRAMUSCULAR | Status: DC | PRN

## 2020-09-11 MED ORDER — LIDOCAINE-EPINEPHRINE 1 %-1:100000 IJ SOLN
INTRAMUSCULAR | Status: AC
  Filled 2020-09-11: qty 1

## 2020-09-11 MED ORDER — SODIUM CHLORIDE 0.9 % IV SOLN
250.0000 mL | INTRAVENOUS | Status: DC
  Administered 2020-09-11: 250 mL via INTRAVENOUS

## 2020-09-11 MED ORDER — SUGAMMADEX SODIUM 200 MG/2ML IV SOLN
INTRAVENOUS | Status: DC | PRN
  Administered 2020-09-11: 200 mg via INTRAVENOUS

## 2020-09-11 MED ORDER — DEXAMETHASONE SODIUM PHOSPHATE 10 MG/ML IJ SOLN
INTRAMUSCULAR | Status: AC
  Filled 2020-09-11: qty 1

## 2020-09-11 MED ORDER — EPHEDRINE SULFATE-NACL 50-0.9 MG/10ML-% IV SOSY
PREFILLED_SYRINGE | INTRAVENOUS | Status: DC | PRN
  Administered 2020-09-11: 10 mg via INTRAVENOUS
  Administered 2020-09-11: 5 mg via INTRAVENOUS

## 2020-09-11 MED ORDER — MENTHOL 3 MG MT LOZG: 1.0000 | LOZENGE | OROMUCOSAL | Status: DC | PRN

## 2020-09-11 MED ORDER — ROCURONIUM BROMIDE 10 MG/ML (PF) SYRINGE
PREFILLED_SYRINGE | INTRAVENOUS | Status: AC
  Filled 2020-09-11: qty 10

## 2020-09-11 MED ORDER — ADULT MULTIVITAMIN W/MINERALS CH
1.0000 | ORAL_TABLET | Freq: Every day | ORAL | Status: DC
  Administered 2020-09-11 – 2020-09-12 (×2): 1 via ORAL
  Filled 2020-09-11 (×2): qty 1

## 2020-09-11 MED ORDER — ORAL CARE MOUTH RINSE: 15.0000 mL | Freq: Once | OROMUCOSAL | Status: AC

## 2020-09-11 MED ORDER — DEXAMETHASONE SODIUM PHOSPHATE 10 MG/ML IJ SOLN
INTRAMUSCULAR | Status: DC | PRN
  Administered 2020-09-11: 4 mg via INTRAVENOUS

## 2020-09-11 MED ORDER — CHLORHEXIDINE GLUCONATE 0.12 % MT SOLN
15.0000 mL | Freq: Once | OROMUCOSAL | Status: AC
  Administered 2020-09-11: 15 mL via OROMUCOSAL
  Filled 2020-09-11: qty 15

## 2020-09-11 MED ORDER — INSULIN ASPART 100 UNIT/ML ~~LOC~~ SOLN
0.0000 [IU] | Freq: Every day | SUBCUTANEOUS | Status: DC
  Administered 2020-09-11: 3 [IU] via SUBCUTANEOUS

## 2020-09-11 MED ORDER — ACETAMINOPHEN 325 MG PO TABS
650.0000 mg | ORAL_TABLET | ORAL | Status: DC | PRN
Start: 2020-09-11 — End: 2020-09-12
  Administered 2020-09-11 – 2020-09-12 (×2): 650 mg via ORAL
  Filled 2020-09-11 (×2): qty 2

## 2020-09-11 MED ORDER — ONDANSETRON HCL 4 MG/2ML IJ SOLN
4.0000 mg | Freq: Four times a day (QID) | INTRAMUSCULAR | Status: DC | PRN
  Administered 2020-09-11: 4 mg via INTRAVENOUS
  Filled 2020-09-11: qty 2

## 2020-09-11 MED ORDER — PROPOFOL 10 MG/ML IV BOLUS
INTRAVENOUS | Status: DC | PRN
  Administered 2020-09-11: 150 mg via INTRAVENOUS

## 2020-09-11 MED ORDER — FENTANYL CITRATE (PF) 250 MCG/5ML IJ SOLN
INTRAMUSCULAR | Status: AC
  Filled 2020-09-11: qty 5

## 2020-09-11 SURGICAL SUPPLY — 32 items
ADH SKN CLS APL DERMABOND .7 (GAUZE/BANDAGES/DRESSINGS) ×1
DERMABOND ADVANCED (GAUZE/BANDAGES/DRESSINGS) ×1
DERMABOND ADVANCED .7 DNX12 (GAUZE/BANDAGES/DRESSINGS) ×1 IMPLANT
DRAPE C-ARM 42X72 X-RAY (DRAPES) ×4 IMPLANT
DRAPE HALF SHEET 40X57 (DRAPES) ×2 IMPLANT
DRAPE LAPAROTOMY 100X72X124 (DRAPES) ×2 IMPLANT
DURAPREP 26ML APPLICATOR (WOUND CARE) ×2 IMPLANT
GAUZE 4X4 16PLY RFD (DISPOSABLE) ×2 IMPLANT
GLOVE BIOGEL PI IND STRL 7.5 (GLOVE) ×2 IMPLANT
GLOVE BIOGEL PI INDICATOR 7.5 (GLOVE) ×2
GLOVE ECLIPSE 7.5 STRL STRAW (GLOVE) ×2 IMPLANT
GLOVE SURG PR MICRO ENCORE 7 (GLOVE) ×2 IMPLANT
GLOVE SURG PR MICRO ENCORE 7.5 (GLOVE) ×2 IMPLANT
GOWN STRL REUS W/ TWL LRG LVL3 (GOWN DISPOSABLE) ×1 IMPLANT
GOWN STRL REUS W/ TWL XL LVL3 (GOWN DISPOSABLE) IMPLANT
GOWN STRL REUS W/TWL 2XL LVL3 (GOWN DISPOSABLE) IMPLANT
GOWN STRL REUS W/TWL LRG LVL3 (GOWN DISPOSABLE) ×4
GOWN STRL REUS W/TWL XL LVL3 (GOWN DISPOSABLE) ×4
KIT BASIN OR (CUSTOM PROCEDURE TRAY) ×2 IMPLANT
KIT TURNOVER KIT B (KITS) ×2 IMPLANT
NDL HYPO 25X1 1.5 SAFETY (NEEDLE) ×1 IMPLANT
NEEDLE HYPO 25X1 1.5 SAFETY (NEEDLE) ×2 IMPLANT
NS IRRIG 1000ML POUR BTL (IV SOLUTION) ×2 IMPLANT
PACK LAMINECTOMY NEURO (CUSTOM PROCEDURE TRAY) ×1 IMPLANT
PASSER CATH 36 CODMAN DISP (NEUROSURGERY SUPPLIES) ×1 IMPLANT
SHUNT LUMBOPERITONEAL 84X14 (Shunt) ×1 IMPLANT
SUT MNCRL AB 3-0 PS2 27 (SUTURE) ×2 IMPLANT
SUT SILK 2 0 PERMA HAND 18 BK (SUTURE) ×3 IMPLANT
SUT SILK 2 0 TIES 10X30 (SUTURE) ×1 IMPLANT
SUT VIC AB 2-0 CP2 18 (SUTURE) ×2 IMPLANT
TOWEL GREEN STERILE (TOWEL DISPOSABLE) ×2 IMPLANT
TOWEL GREEN STERILE FF (TOWEL DISPOSABLE) ×2 IMPLANT

## 2020-09-11 NOTE — Anesthesia Procedure Notes (Signed)
Procedure Name: Intubation Date/Time: 09/11/2020 12:40 PM Performed by: Trinna Post., CRNA Pre-anesthesia Checklist: Patient identified, Emergency Drugs available, Suction available, Patient being monitored and Timeout performed Patient Re-evaluated:Patient Re-evaluated prior to induction Oxygen Delivery Method: Circle system utilized Preoxygenation: Pre-oxygenation with 100% oxygen Induction Type: IV induction Ventilation: Mask ventilation without difficulty Laryngoscope Size: Miller and 3 Grade View: Grade I Tube type: Oral Tube size: 7.5 mm Number of attempts: 1 Airway Equipment and Method: Stylet Placement Confirmation: ETT inserted through vocal cords under direct vision,  positive ETCO2 and breath sounds checked- equal and bilateral Secured at: 23 cm Tube secured with: Tape Dental Injury: Teeth and Oropharynx as per pre-operative assessment

## 2020-09-11 NOTE — Transfer of Care (Signed)
Immediate Anesthesia Transfer of Care Note  Patient: [name]  Procedure(s) Performed: Placement of Lumboperitoneal Shunt (N/A Back)  Patient Location: PACU  Anesthesia Type:General  Level of Consciousness: drowsy  Airway & Oxygen Therapy: Patient Spontanous Breathing and Patient connected to nasal cannula oxygen  Post-op Assessment: Report given to RN and Post -op Vital signs reviewed and stable  Post vital signs: Reviewed and stable  Last Vitals:  Vitals Value Taken Time  BP 156/89 09/11/20 1503  Temp    Pulse 94 09/11/20 1503  Resp 15 09/11/20 1504  SpO2 100 % 09/11/20 1503  Vitals shown include unvalidated device data.  Last Pain:  Vitals:   09/11/20 1059  TempSrc:   PainSc: 0-No pain         Complications: No complications documented.

## 2020-09-11 NOTE — Anesthesia Postprocedure Evaluation (Signed)
Anesthesia Post Note  Patient: [name]  Procedure(s) Performed: Placement of Lumboperitoneal Shunt (N/A Back)     Patient location during evaluation: PACU Anesthesia Type: General Level of consciousness: awake and alert Pain management: pain level controlled Vital Signs Assessment: post-procedure vital signs reviewed and stable Respiratory status: spontaneous breathing, nonlabored ventilation, respiratory function stable and patient connected to nasal cannula oxygen Cardiovascular status: blood pressure returned to baseline and stable Postop Assessment: no apparent nausea or vomiting Anesthetic complications: no   No complications documented.  Last Vitals:  Vitals:   09/11/20 1555 09/11/20 1609  BP: (!) 141/95 (!) 157/95  Pulse:  79  Resp:  18  Temp:  36.4 C  SpO2:  100%    Last Pain:  Vitals:   09/11/20 1609  TempSrc: Oral  PainSc:                  March Rummage Madelynn Malson

## 2020-09-11 NOTE — Brief Op Note (Signed)
09/11/2020  2:52 PM  PATIENT:  Jeneen Rinks A Sciarra  70 y.o. male  PRE-OPERATIVE DIAGNOSIS:  Normal pressure hydrocephalus  POST-OPERATIVE DIAGNOSIS:  Normal pressure hydrocephalus  PROCEDURE:  Procedure(s): Placement of Lumboperitoneal Shunt (N/A)  SURGEON:  Surgeon(s) and Role:    * Azrael Huss, Joyice Faster, MD - Primary  PHYSICIAN ASSISTANT:   ASSISTANTS: none   ANESTHESIA:   general  EBL:  30cc  BLOOD ADMINISTERED:none  DRAINS: none   LOCAL MEDICATIONS USED:  LIDOCAINE   SPECIMEN:  No Specimen  DISPOSITION OF SPECIMEN:  N/A  COUNTS:  YES  TOURNIQUET:  * No tourniquets in log *  DICTATION: .Note written in EPIC  PLAN OF CARE: Admit to inpatient   PATIENT DISPOSITION:  PACU - hemodynamically stable.   Delay start of Pharmacological VTE agent (>24hrs) due to surgical blood loss or risk of bleeding: yes

## 2020-09-11 NOTE — Anesthesia Preprocedure Evaluation (Addendum)
Anesthesia Evaluation  Patient identified by MRN, date of birth, ID band Patient awake    Reviewed: Allergy & Precautions, NPO status , Patient's Chart, lab work & pertinent test results  Airway Mallampati: II  TM Distance: >3 FB Neck ROM: Full    Dental  (+) Teeth Intact   Pulmonary sleep apnea ,  esophageal cancer (s/p left VATS, left thoracoabdominal esophagogastrectomy with jejunostomy and pyloroplasty 04/24/04)   Pulmonary exam normal        Cardiovascular hypertension, Pt. on medications  Rhythm:Regular Rate:Normal     Neuro/Psych Dementia NPH s/p lumbar drain 08/14/20  Neuromuscular disease (Parkinson's)    GI/Hepatic Neg liver ROS, GERD  Medicated,  Endo/Other  diabetes, Type 2, Oral Hypoglycemic Agents  Renal/GU   negative genitourinary   Musculoskeletal negative musculoskeletal ROS (+)   Abdominal (+)  Abdomen: soft. Bowel sounds: normal.  Peds  Hematology  (+) anemia ,   Anesthesia Other Findings   Reproductive/Obstetrics                             Anesthesia Physical Anesthesia Plan  ASA: III  Anesthesia Plan: General   Post-op Pain Management:    Induction: Intravenous  PONV Risk Score and Plan: 2 and Ondansetron and Treatment may vary due to age or medical condition  Airway Management Planned: Mask and Oral ETT  Additional Equipment: None  Intra-op Plan:   Post-operative Plan: Extubation in OR  Informed Consent: I have reviewed the patients History and Physical, chart, labs and discussed the procedure including the risks, benefits and alternatives for the proposed anesthesia with the patient or authorized representative who has indicated his/her understanding and acceptance.     Dental advisory given  Plan Discussed with: CRNA  Anesthesia Plan Comments: (Lab Results      Component                Value               Date                      WBC                       5.7                 08/14/2020                HGB                      11.3 (L)            08/14/2020                HCT                      35.1 (L)            08/14/2020                MCV                      94.4                08/14/2020                PLT  205                 08/14/2020           Lab Results      Component                Value               Date                      NA                       138                 08/14/2020                K                        4.0                 08/14/2020                CO2                      27                  08/14/2020                GLUCOSE                  109 (H)             08/14/2020                BUN                      31 (H)              08/14/2020                CREATININE               1.63 (H)            08/14/2020                CALCIUM                  8.7 (L)             08/14/2020                GFRNONAA                 45 (L)              08/14/2020                GFRAA                    47 (L)              02/16/2020          )        Anesthesia Quick Evaluation

## 2020-09-11 NOTE — Op Note (Signed)
PATIENT: [name]  DAY OF SURGERY: 09/11/20   PRE-OPERATIVE DIAGNOSIS:  Normal pressure hydrocephalus   POST-OPERATIVE DIAGNOSIS:  Same   PROCEDURE:  Lumboperitoneal shunt placement   SURGEON:  Surgeon(s) and Role:    Judith Part, MD - Primary   ANESTHESIA: ETGA   BRIEF HISTORY: This is a 70 year old man who presented with progressive cognitive decline concerning for possible NPH. He had a lumbar drain trial with improvement, I therefore recommended LP shunt placement. This was discussed with the patient as well as risks, benefits, and alternatives and wished to proceed with surgery.   OPERATIVE DETAIL: The patient was taken to the operating room and placed on the OR table in the lateral decubitus position after general anesthesia had been induced by the anesthesia team. A formal time out was performed with two patient identifiers and confirmed the operative site. The operative sites were marked, hair was clipped with surgical clippers, the area was then prepped and draped in a sterile fashion.   The spinal catheter was placed first. A linear midline incision was made in the lumbar spine, using fluoroscopy for guidance to the interspace. A Touhy needle was guided into the interspace using fluoroscopic guidance until CSF was obtained. The catheter was threaded through the needle without resistance and the catheter location was confirmed with fluoroscopy. The catheter was then secured to the lumbar fascia with anchors and a strain loop.  A tunneler was then used to tunnel over the flank to the abdomen. An incision was placed here and a small laparotomy was created. The proximal catheter was cut, the anchor / protection device was attached, spontaneous flow was obtained, and it was connected to the valve and secured with a 2-0 silk suture. A pocket was made for the valve just lateral to the incision and the valve was secured with 2-0 silk suture to prevent rotation.   The distal  catheter was flushed and connected to the valve and placed into the abdomen. Prior to placement in the abdomen, good flow was confirmed from the distal catheter. It was then inserted into the abdomen without resistance using two bayonet forceps. The connection between the valve and distal catheter was secured with 2-0 silk suture. Of note, I did not remove any of the distal catheter.  All instrument and sponge counts were correct, the incisions were copiously irrigated then closed in layers. The patient was then returned to anesthesia for emergence. No apparent complications at the completion of the procedure.   EBL:  84mL   DRAINS: none   SPECIMENS: none  IMPLANTS: Medtronic NSC LP shunt system, set to performance level 1.5. The distal catheter was implanted intact without a change in length.    Judith Part, MD 09/11/20 11:13 AM

## 2020-09-11 NOTE — H&P (Signed)
Surgical H&P Update  HPI: 70 y.o. man with history of suspected NPH. He was previously admitted for a lumbar drain trial and had a improvement with drainage. I therefore recommended LP shunt placement. No changes in health since he was last seen. Still having symptoms and wishes to proceed with surgery.  PMHx:  Past Medical History:  Diagnosis Date  . Anemia   . Barrett esophagus   . Chronic kidney disease (CKD), stage III (moderate) (HCC)   . Dementia ([place])   . Diabetes mellitus ([place])   . Esophageal cancer ([place])   . Gastroparesis   . GERD (gastroesophageal reflux disease)   . History of MRSA infection   . Hydrocephalus ([place])   . Hypertension   . Parkinson's disease ([place])   . Pneumonia   . Sleep apnea    no longer uses a cpap   FamHx:  Family History  Problem Relation Age of Onset  . Cancer Mother        colon, breast  . Heart disease Father   . Diabetes Other        FX HX  . Stroke Paternal Aunt   . Dementia Neg Hx   . Parkinson's disease Neg Hx    SocHx:  reports that he has never smoked. He has never used smokeless tobacco. He reports current alcohol use. He reports that he does not use drugs.  Physical Exam: AOx3, Strength 5/5 x4, SILTx4  Assesment/Plan: 70 y.o. man with a history of NPH, here for LP shunt placement. Risks, benefits, and alternatives discussed and the patient would like to continue with surgery.  -OR today -3C post-op  Judith Part, MD 09/11/20 11:11 AM

## 2020-09-12 ENCOUNTER — Encounter (HOSPITAL_COMMUNITY): Payer: Self-pay | Admitting: Neurological Surgery

## 2020-09-12 DIAGNOSIS — N183 Chronic kidney disease, stage 3 unspecified: Secondary | ICD-10-CM | POA: Diagnosis not present

## 2020-09-12 DIAGNOSIS — Z8501 Personal history of malignant neoplasm of esophagus: Secondary | ICD-10-CM | POA: Diagnosis not present

## 2020-09-12 DIAGNOSIS — G912 (Idiopathic) normal pressure hydrocephalus: Secondary | ICD-10-CM | POA: Diagnosis not present

## 2020-09-12 DIAGNOSIS — I129 Hypertensive chronic kidney disease with stage 1 through stage 4 chronic kidney disease, or unspecified chronic kidney disease: Secondary | ICD-10-CM | POA: Diagnosis not present

## 2020-09-12 DIAGNOSIS — E1122 Type 2 diabetes mellitus with diabetic chronic kidney disease: Secondary | ICD-10-CM | POA: Diagnosis not present

## 2020-09-12 LAB — GLUCOSE, CAPILLARY: Glucose-Capillary: 89 mg/dL (ref 70–99)

## 2020-09-12 NOTE — Discharge Instructions (Signed)
Discharge Instructions ° °No restriction in activities, slowly increase your activity back to normal.  ° °Your incision is closed with dermabond (purple glue). This will naturally fall off over the next 1-2 weeks.  ° °Okay to shower on the day of discharge. Use regular soap and water and try to be gentle when cleaning your incision.  ° °Follow up with Dr. Keria Widrig in 2 weeks after discharge. If you do not already have a discharge appointment, please call his office at [phone] to schedule a follow up appointment. If you have any concerns or questions, please call the office and let us know. °

## 2020-09-12 NOTE — TOC Initial Note (Signed)
Transition of Care [facility]) - Initial/Assessment Note    Patient Details  Name: [name] MRN: [id number] Date of Birth: 10/03/1950  Transition of Care [facility]) CM/SW Contact:    Joanne Chars, LCSW Phone Number: 09/12/2020, 10:19 AM  Clinical Narrative:  cSW met with pt and wife [name] to discuss recommendation for [facility].  Permission given to speak with wife.  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  They are working on getting into a facility but would like social work assistance for the housing issues.  Choice document given.  They are agreeable to [facility] referral, were recently set up with [facility] after pt was hospitalized in 3/22 but they did not respond or schedule when [facility] reached out. They are agreeable to another referral to [facility].    Cory at [facility] agrees to accept the referral and is aware that the pt continues at the hotel and needs social work assistance for the housing issues.                  Expected Discharge Plan: [place] Barriers to Discharge: No Barriers Identified   Patient Goals and CMS Choice Patient states their goals for this hospitalization and ongoing recovery are:: "get back to work" [employer] Compare Post Acute Care list provided to:: Patient Choice offered to / list presented to : Patient  Expected Discharge Plan and Services Expected Discharge Plan: [place] Choice: [place] arrangements for the past 2 months: Hotel/Motel Expected Discharge Date: 09/12/20               DME Arranged: N/A (DME through [facility] staff)                    Prior Living Arrangements/Services Living arrangements for the past 2 months: Hotel/Motel Lives with:: Spouse Patient language and need for interpreter reviewed:: Yes Do you feel safe going back to the place where you  live?: Yes      Need for Family Participation in Patient Care: No (Comment) Care giver support system in place?: Yes (comment) Current home services: Other (comment) (none) Criminal Activity/Legal Involvement Pertinent to Current Situation/Hospitalization: No - Comment as needed  Activities of Daily Living Home Assistive Devices/Equipment: Walker (specify type),Cane (specify quad or straight),Eyeglasses ADL Screening (condition at time of admission) Patient's cognitive ability adequate to safely complete daily activities?: Yes Is the patient deaf or have difficulty hearing?: No Does the patient have difficulty seeing, even when wearing glasses/contacts?: No Does the patient have difficulty concentrating, remembering, or making decisions?: Yes Patient able to express need for assistance with ADLs?: Yes Does the patient have difficulty dressing or bathing?: Yes Independently performs ADLs?: No Communication: Independent Dressing (OT): Needs assistance Is this a change from baseline?: Pre-admission baseline Grooming: Needs assistance Is this a change from baseline?: Pre-admission baseline Feeding: Independent Bathing: Needs assistance Is this a change from baseline?: Pre-admission baseline Toileting: Needs assistance Is this a change from baseline?: Pre-admission baseline In/Out Bed: Needs assistance Is this a change from baseline?: Pre-admission baseline Walks in Home: Independent with device (comment) Does the patient have difficulty walking or climbing stairs?: Yes Weakness of Legs: Both Weakness of Arms/Hands: Both  Permission Sought/Granted Permission sought to share information with : Family Supports Permission granted to share information with :  Yes, Verbal Permission Granted  Share Information with NAME: wife [name]  Permission granted to share info w AGENCY: HH        Emotional Assessment Appearance:: Appears stated age Attitude/Demeanor/Rapport: Engaged Affect  (typically observed): Appropriate,Pleasant Orientation: : Oriented to Self,Oriented to Place,Oriented to  Time,Oriented to Situation Alcohol / Substance Use: Not Applicable Psych Involvement: No (comment)  Admission diagnosis:  Normal pressure hydrocephalus ([place]) [G91.2] Patient Active Problem List   Diagnosis Date Noted  . Normal pressure hydrocephalus ([place]) 08/14/2020  . GERD (gastroesophageal reflux disease) 06/28/2020  . Malnutrition of moderate degree 06/27/2020  . Type 2 diabetes mellitus with hyperglycemia ([place]) 06/25/2020  . Lobar pneumonia ([place]) 06/25/2020  . Fall at home, initial encounter 06/25/2020  . Cognitive impairment 06/25/2020  . Sepsis secondary to UTI ([place]) 06/24/2020  . Barrett esophagus   . Mild protein malnutrition ([place])   . Diabetes mellitus ([place])   . Hypertension   . Chronic kidney disease (CKD), stage III (moderate) (HCC)    PCP:  Jani Gravel, MD Pharmacy:   [place] [place] [facility] [place] [address] Phone: [phone] Fax: [phone]     Social Determinants of Health (SDOH) Interventions    Readmission Risk Interventions Readmission Risk Prevention Plan 06/27/2020  Post Dischage Appt Complete  Medication Screening Complete  Transportation Screening Complete  Some recent data might be hidden

## 2020-09-12 NOTE — Progress Notes (Signed)
Patient alert and oriented, voiding adequately, MAE well with no difficulty. Incision area cdi with no s/s of infection. Patient discharged home per order. Patient and spouse stated understanding of discharge instructions given. Patient has an appointment with Dr. Zada Finders.

## 2020-09-12 NOTE — Progress Notes (Signed)
Neurosurgery Service Progress Note  Subjective: No acute events overnight, feels like gait is improved from preop, eating w/o issue, no headaches, some soreness around the lumbar incision, no new radicular sx, doing well   Objective: Vitals:   09/11/20 1923 09/11/20 1923 09/11/20 2310 09/12/20 0334  BP: (!) 142/87 (!) 142/87 125/72 122/80  Pulse: 80 82 73 76  Resp: 18 18 18 18   Temp: 97.7 F (36.5 C) 97.7 F (36.5 C) 97.7 F (36.5 C) 98.1 F (36.7 C)  TempSrc: Oral Oral Oral Oral  SpO2: 100% 100% 100% 100%  Weight:      Height:        Physical Exam: AOx3, PERRL, EOMI, FS, TM, Strength 5/5 x4, SILTx4  Assessment & Plan: 70 y.o. man w/ NPH s/p LP shunt, recovering well.  -discharge home today  Judith Part  09/12/20 7:40 AM

## 2020-09-12 NOTE — Discharge Summary (Signed)
Discharge Summary  Date of Admission: 09/11/2020  Date of Discharge: 09/12/20  Attending Physician: Emelda Brothers, MD  Hospital Course: Patient was admitted following an uncomplicated lumboperitoneal shunt placement for NPH. He was recovered in PACU and transferred to [facility]. His hospital course was uncomplicated and the patient was discharged home on 09/12/20. He will follow up in clinic with me in 2 weeks.  Neurologic exam at discharge:  AOx3, PERRL, EOMI, FS, TM Strength 5/5 x4, SILTx4  Discharge diagnosis: Normal pressure hydrocephalus  Judith Part, MD 09/12/20 7:44 AM

## 2020-09-12 NOTE — Evaluation (Signed)
Physical Therapy Evaluation Patient Details Name: [name] MRN: [id number] DOB: 1950/11/04 Today's Date: 09/12/2020   History of Present Illness  Pt is a 70 y/o M admitted s/p lumboparitoneal shunt on 09/11/2020. Pt found to have NPH and drain previously placed 08/14/2020. Significant PMH: dementia, CKD stage III, diabetes mellitus, Parkinson's disease.    Clinical Impression  Pt admitted with above diagnosis. At the time of PT eval, pt was able to demonstrate transfers and ambulation with gross supervision for safety to min guard assist and RW for support. Pt was educated on precautions,  appropriate activity progression, and car transfer. Pt currently with functional limitations due to the deficits listed below (see PT Problem List). Pt will benefit from skilled PT to increase their independence and safety with mobility to allow discharge to the venue listed below.      Follow Up Recommendations Home health PT;Supervision - Intermittent    Equipment Recommendations  Rolling walker with 5" wheels;3in1 (PT) (If pt does not have - need to confirm with his wife)    Recommendations for Other Services       Precautions / Restrictions Precautions Precautions: Fall Restrictions Weight Bearing Restrictions: No      Mobility  Bed Mobility               General bed mobility comments: Pt was received standing in the room when PT arrived.    Transfers Overall transfer level: Modified independent Equipment used: Rolling walker (2 wheeled)             General transfer comment: VC's for improved posture.  Ambulation/Gait Ambulation/Gait assistance: Supervision Gait Distance (Feet): 200 Feet Assistive device: Rolling walker (2 wheeled) Gait Pattern/deviations: Step-through pattern;Decreased stride length;Trunk flexed Gait velocity: Decreased Gait velocity interpretation: <1.31 ft/sec, indicative of household ambulator General Gait Details: Very flexed trunk and  forward head posture with rounded shoulders which is baseline. VC's to improve posture as able. Pt with frequent pauses in gait as he does not walk and talk at the same time. No overt LOB noted. Close supervision provided for safety.  Stairs Stairs: Yes Stairs assistance: Min guard Stair Management: One rail Left;Step to pattern;Forwards Number of Stairs: 4 General stair comments: VC's for sequencing and general safety. Very slow to progress but no assist required.  Wheelchair Mobility    Modified Rankin (Stroke Patients Only)       Balance Overall balance assessment: Needs assistance Sitting-balance support: Feet supported;No upper extremity supported Sitting balance-Leahy Scale: Fair     Standing balance support: No upper extremity supported;During functional activity Standing balance-Leahy Scale: Fair                               Pertinent Vitals/Pain Pain Assessment: Faces Faces Pain Scale: Hurts a little bit Pain Location: Abdomen Pain Descriptors / Indicators: Operative site guarding Pain Intervention(s): Limited activity within patient's tolerance;Monitored during session;Repositioned    Home Living Family/patient expects to be discharged to:: Private residence Living Arrangements: Spouse/significant other Available Help at Discharge: Family;Available 24 hours/day Type of Home: House Home Access: Stairs to enter Entrance Stairs-Rails:  (rail in center per note from previous admission) Entrance Stairs-Number of Steps: 3 Home Layout: One level Home Equipment: None Additional Comments: Pt is adamant he has no equipment at home as he gave it all away to a church. Per chart review pt has a walker and a cane at home, as well as possibly a 3  wheeled walker    Prior Function Level of Independence: Needs assistance   Gait / Transfers Assistance Needed: reports using walker prior to admission and needing help from spouse with steps  ADL's / Homemaking  Assistance Needed: spouse is my "primary care taker" assisted with ADL/IADLs. pt states has been sponge bathing vs transfer into step in shower        Hand Dominance   Dominant Hand: Right    Extremity/Trunk Assessment   Upper Extremity Assessment Upper Extremity Assessment: Defer to OT evaluation    Lower Extremity Assessment Lower Extremity Assessment: Generalized weakness (LLE more painful to advance due to abdomen)    Cervical / Trunk Assessment Cervical / Trunk Assessment: Kyphotic;Other exceptions Cervical / Trunk Exceptions: s/p surgery  Communication   Communication: No difficulties  Cognition Arousal/Alertness: Awake/alert Behavior During Therapy: WFL for tasks assessed/performed Overall Cognitive Status: History of cognitive impairments - at baseline                                 General Comments: Dementia at baseline.      General Comments      Exercises     Assessment/Plan    PT Assessment Patient needs continued PT services  PT Problem List Decreased strength;Decreased activity tolerance;Decreased balance;Decreased mobility;Decreased knowledge of use of DME;Decreased safety awareness;Decreased knowledge of precautions;Pain       PT Treatment Interventions DME instruction;Gait training;Stair training;Functional mobility training;Therapeutic activities;Therapeutic exercise;Neuromuscular re-education;Patient/family education    PT Goals (Current goals can be found in the Care Plan section)  Acute Rehab PT Goals Patient Stated Goal: Be able to stay by himself at home. PT Goal Formulation: With patient Time For Goal Achievement: 09/19/20 Potential to Achieve Goals: Good    Frequency Min 3X/week   Barriers to discharge        Co-evaluation               AM-PAC PT "6 Clicks" Mobility  Outcome Measure Help needed turning from your back to your side while in a flat bed without using bedrails?: None Help needed moving from  lying on your back to sitting on the side of a flat bed without using bedrails?: None Help needed moving to and from a bed to a chair (including a wheelchair)?: None Help needed standing up from a chair using your arms (e.g., wheelchair or bedside chair)?: None Help needed to walk in hospital room?: A Little Help needed climbing 3-5 steps with a railing? : A Little 6 Click Score: 22    End of Session Equipment Utilized During Treatment: Gait belt Activity Tolerance: Patient tolerated treatment well Patient left: in bed;with call bell/phone within reach Nurse Communication: Mobility status PT Visit Diagnosis: Unsteadiness on feet (R26.81);Pain Pain - part of body:  (abdomen)    Time: 8502-7741 PT Time Calculation (min) (ACUTE ONLY): 21 min   Charges:   PT Evaluation $PT Eval Low Complexity: 1 Low          Rolinda Roan, PT, DPT Acute Rehabilitation Services Pager: [phone] Office: [phone]   Thelma Comp 09/12/2020, 9:18 AM

## 2020-10-09 DIAGNOSIS — Z20828 Contact with and (suspected) exposure to other viral communicable diseases: Secondary | ICD-10-CM | POA: Diagnosis not present

## 2020-10-16 DIAGNOSIS — Z20828 Contact with and (suspected) exposure to other viral communicable diseases: Secondary | ICD-10-CM | POA: Diagnosis not present

## 2020-11-22 ENCOUNTER — Telehealth: Payer: Self-pay | Admitting: *Deleted

## 2020-11-22 ENCOUNTER — Encounter: Payer: Self-pay | Admitting: Adult Health

## 2020-11-22 ENCOUNTER — Ambulatory Visit: Payer: Medicare PPO | Admitting: Adult Health

## 2020-11-22 NOTE — Telephone Encounter (Signed)
Patient no showed the follow-up appointment with MM NP today. 1st no show at [facility].

## 2020-11-23 ENCOUNTER — Ambulatory Visit: Payer: Medicare PPO | Admitting: Adult Health

## 2020-11-23 VITALS — BP 113/70 | HR 90 | Ht 77.0 in | Wt 148.0 lb

## 2020-11-23 DIAGNOSIS — G912 (Idiopathic) normal pressure hydrocephalus: Secondary | ICD-10-CM

## 2020-11-23 NOTE — Progress Notes (Addendum)
PATIENT: [name] DOB: June 08, 1950  REASON FOR VISIT: follow up HISTORY FROM: patient PRIMARY NEUROLOGIST: Dr. Jaynee Eagles   HISTORY OF PRESENT ILLNESS: Today 11/23/20:  [name] is a 70 year old male with a history of possible normal pressure hydrocephalus.  He returns today for follow-up.  He was sent to Dr. Venetia Constable for lumbar drain.  Patient's wife reports that after the placement of the drain she did notice some improvement but over time she feels that he has gradually worsened again.  She notes that he has lost approximately 20 pounds with weight.  She states that he does not want to eat.  She states that he has no desire to go and do things.  Stays at home most of the time.  The patient feels that his memory is "crap."  He denies any significant changes with his gait or balance.  He does use a walker.  Returns today for an evaluation.  HISTORY (copied from Dr. Cathren Laine note) Interval history 05/01/2020:MRI brain showed enlarged ventricles very suspicious for NPH but also moderately advanced atrophy. Formal memory testing showed possibly a component of NPH. Large volume tap did not show any patient improvement but given findings on formal memory testing may consider temporary lumbar drain. To complicate more, DAT scan showed possible parkinsonian syndrome which may be affecting his gait. Cannot rule out multiple etiologies. Will send to Neurosurgery for eval of a temporary lumbar drain.    Interval history February 15, 2020: Patient is here today for evaluation after high-volume lumbar puncture for possible normal pressure hydrocephalus. Per wife Mobility is not better. Unclear if cognition is improved but she does not think, patient's wife thinks he "plays crazy" so not sure if he is ignoring her or not. It was "[facility]" getting him here, she told him to wash up and he was acting like he didn;t know how, or how to get dressed it was a battle and she thinks it is on purpose. (Wife  playing video games throughout the appointment on her phone).He says he ignores it sometimes but he does not feel he had any improvement in cognition. Wife says he is "up and down" all night. Wife has "had it". He says he can be "obstinant" and not follow her instructions.    I did discuss with wife even if we treat the NPH he still may need additional care, she has been calling our office recently saying she cannot care for him anymore although nothing acute or subacutely new, patient is going to have to talk to family and primary care and possibly discuss with social work any help that she can get for patient.       HPI:  [name] is a 70 y.o. male here as requested urgently by Jani Gravel, MD for confusion and unsteady gait. PMHx hypertension, GERD, gastroparesis, esophageal cancer, diabetes, chronic kidney disease stage III, Barrett esophagus I reviewed Dr. Julianne Rice notes, confusion getting worse in the last few months, and steady gait for more than a year but getting worse, impaired hand eye coordination, voice getting weaker especially in the last 3 weeks, difficulty remembering how to find his way to places he wants to go, difficulty walking since February 2020, he has been to physical therapy, he has been falling at least several times over the prior few weeks, dropping things, knocking things over when he reaches for things, high-end eye coordination not as good as it was, trouble remembering names and trouble remembering words, forgetful and  confused, he can member where he is going so he is not driving and he cannot remember how to use his cell phone does not always understand what his wife is telling him, he has to be told everything to do to one step at a time.  Also had hypertension and his amlodipine was held at last appointment.  I reviewed labs which included CBC which was unremarkable mild anemia, BMP showed a BUN of 29, creatinine 1.3, decreased GFR, otherwise unremarkable including  normal LFTs, ferritin was 31, A1c 7.4, LDL 63, urinalysis did not show infection, all these labs were collected November 02, 2019.     Patient says he is frustrated, "I can't remember crap", wife provides most information, they recently were evicted and things worsened since then so a lot of stress in life. Balance issues ongoing 1-2 years at least and progressively worsening, he says he just all of a sudden falls, he may trip over things, sometimes he just loses his balance and falls he misinterprets where certain places are in the room, he has difficulty with depth perception, He denies significant numbness in the legs or weakness sin the legs. He is walking slow, wife says he is shuffling. No shaking or tremors. Voice is becoming softer it is hard for him to project. No problem with smell. He is barely eating part may be depression lately, they are in a hotel right now. No active dreams, no significant snoring, he has a FHx of stroke but not dementia in the family. Whole body affected, he can;t sit up straight, when he walks he bends over. He has scoliosis but no new or significant low back or neck pain or radicular symptoms. They deny memory changes except in the last few weeks in the setting of stress he has forgotten how to use his cell phone for example like he is a child. Vision and hearing is fine. Most of the time he can make it to the bathroom.No other focal neurologic deficits, associated symptoms, inciting events or modifiable factors.   Reviewed notes, labs and imaging from outside physicians, which showed:   MRI brain 2009: reviewed images and agree with these findings:   Normal craniovertebral junction.  Diffuse ventricular  dilatation.  Dilatation of the lateral, third, and fourth  ventricles.  The temporal horns are mildly dilated. Mild cerebral  atrophy. There are some atrophic changes involving the cerebellum,  mainly the vermis with enlargement of the folia however I feel that  the  fourth ventricular dilatation is disproportionate.     Negative for mass, hemorrhage, or acute/subacute infarction.  Patency of the vertebral, basilar, and internal carotid arteries.     IMPRESSION:  Diffuse ventricular dilatation.  I cannot exclude normal pressure  hydrocephalus. See above comments.    REVIEW OF SYSTEMS: Out of a complete 14 system review of symptoms, the patient complains only of the following symptoms, and all other reviewed systems are negative.  ALLERGIES: Allergies  Allergen Reactions   Duragen [Estradiol Valerate] Other (See Comments)    unknown   Fentanyl Other (See Comments)    hallucinations   Morphine And Related Other (See Comments)    Hallucinations.    HOME MEDICATIONS: Outpatient Medications Prior to Visit  Medication Sig Dispense Refill   esomeprazole (NEXIUM) 40 MG capsule Take 40 mg by mouth daily before breakfast.     Iron-FA-B Cmp-C-Biot-Probiotic (FUSION PLUS) CAPS Take 1 capsule by mouth daily.     metFORMIN (GLUCOPHAGE) 1000 MG tablet Take 1,000  mg by mouth 2 (two) times daily.     Multiple Vitamin (MULTIVITAMIN WITH MINERALS) TABS tablet Take 1 tablet by mouth daily. Centrum     quinapril-hydrochlorothiazide (ACCURETIC) 10-12.5 MG per tablet Take 1 tablet by mouth daily.     No facility-administered medications prior to visit.    PAST MEDICAL HISTORY: Past Medical History:  Diagnosis Date   Anemia    Barrett esophagus    Chronic kidney disease (CKD), stage III (moderate) (HCC)    Dementia (HCC)    Diabetes mellitus ([place])    Esophageal cancer (HCC)    Gastroparesis    GERD (gastroesophageal reflux disease)    History of MRSA infection    Hydrocephalus (HCC)    Hypertension    Parkinson's disease ([place])    Pneumonia    Sleep apnea    no longer uses a cpap    PAST SURGICAL HISTORY: Past Surgical History:  Procedure Laterality Date   Left VATS, Left thoracoabdominal esophagogastrectomy with jejunostomy and pyloroplasty      04/24/2004 Dr Arlyce Dice   LUMBAR PUNCTURE  02/15/2020   PLACEMENT OF LUMBAR DRAIN N/A 08/14/2020   Procedure: PLACEMENT OF LUMBAR DRAIN;  Surgeon: Judith Part, MD;  Location: [facility];  Service: Neurosurgery;  Laterality: N/A;   R cataract and retinal tears     SHUNT REPLACEMENT N/A 09/11/2020   Procedure: Placement of Lumboperitoneal Shunt;  Surgeon: Judith Part, MD;  Location: [place];  Service: Neurosurgery;  Laterality: N/A;    FAMILY HISTORY: Family History  Problem Relation Age of Onset   Cancer Mother        colon, breast   Heart disease Father    Diabetes Other        FX HX   Stroke Paternal Aunt    Dementia Neg Hx    Parkinson's disease Neg Hx     SOCIAL HISTORY: Social History   Socioeconomic History   Marital status: Married    Spouse name: Not on file   Number of children: Not on file   Years of education: Not on file   Highest education level: Master's degree (e.g., MA, MS, MEng, MEd, MSW, MBA)  Occupational History   Occupation: minister  Tobacco Use   Smoking status: Never   Smokeless tobacco: Never  Vaping Use   Vaping Use: Never used  Substance and Sexual Activity   Alcohol use: Yes    Comment: occasionally drinks Bailey's   Drug use: No   Sexual activity: Yes  Other Topics Concern   Not on file  Social History Narrative   Lives at home with wife   Right handed   Social Determinants of Health   Financial Resource Strain: Not on file  Food Insecurity: Not on file  Transportation Needs: Not on file  Physical Activity: Not on file  Stress: Not on file  Social Connections: Not on file  Intimate Partner Violence: Not on file      PHYSICAL EXAM  Vitals:   11/23/20 1123  BP: 113/70  Pulse: 90  Weight: 148 lb (67.1 kg)  Height: 6\' 5"  (1.956 m)   Body mass index is 17.55 kg/m.  Generalized: Well developed, in no acute distress   Neurological examination  Mentation: Alert oriented to time, place, history taking. Follows all  commands speech and language fluent. MMSE 20/30 Cranial nerve II-XII: Pupils were equal round reactive to light. Extraocular movements were full, visual field were full on confrontational test. Facial sensation and strength were  normal. Uvula tongue midline. Head turning and shoulder shrug  were normal and symmetric. Motor: The motor testing reveals 5 over 5 strength of all 4 extremities. Good symmetric motor tone is noted throughout.  Sensory: Sensory testing is intact to soft touch on all 4 extremities. No evidence of extinction is noted.  Coordination: Cerebellar testing reveals good finger-nose-finger and heel-to-shin bilaterally.  Gait and station: Patient was able to stand from a sitting position.  He ambulated to the bed without assistance.  He ambulated down the hallway with his walker.  Slower pace but good stride. Reflexes: Deep tendon reflexes are symmetric and normal bilaterally.   DIAGNOSTIC DATA (LABS, IMAGING, TESTING) - I reviewed patient records, labs, notes, testing and imaging myself where available.  Lab Results  Component Value Date   WBC 6.3 09/11/2020   HGB 11.3 (L) 09/11/2020   HCT 35.8 (L) 09/11/2020   MCV 95.5 09/11/2020   PLT 195 09/11/2020      Component Value Date/Time   NA 139 09/11/2020 1022   NA 137 02/16/2020 1114   K 4.4 09/11/2020 1022   CL 101 09/11/2020 1022   CO2 27 09/11/2020 1022   GLUCOSE 123 (H) 09/11/2020 1022   BUN 22 09/11/2020 1022   BUN 45 (H) 02/16/2020 1114   CREATININE 1.34 (H) 09/11/2020 1022   CALCIUM 8.8 (L) 09/11/2020 1022   PROT 5.4 (L) 06/26/2020 0420   PROT 6.7 02/16/2020 1114   ALBUMIN 2.8 (L) 06/26/2020 0420   ALBUMIN 4.4 02/16/2020 1114   AST 47 (H) 06/26/2020 0420   ALT 24 06/26/2020 0420   ALKPHOS 60 06/26/2020 0420   BILITOT 0.5 06/26/2020 0420   BILITOT 1.2 02/16/2020 1114   GFRNONAA 57 (L) 09/11/2020 1022   GFRAA 47 (L) 02/16/2020 1114   Lab Results  Component Value Date   CHOL  08/28/2007    83        ATP  III CLASSIFICATION:  <200     mg/dL   Desirable  200-239  mg/dL   Borderline High  >=240    mg/dL   High   HDL 21 (L) 08/28/2007   LDLCALC  08/28/2007    48        Total Cholesterol/HDL:CHD Risk Coronary Heart Disease Risk Table                     Men   Women  1/2 Average Risk   3.4   3.3   TRIG 69 08/28/2007   CHOLHDL 4.0 08/28/2007   Lab Results  Component Value Date   HGBA1C 5.2 06/25/2020   Lab Results  Component Value Date   WGNFAOZH08 657 06/25/2020   Lab Results  Component Value Date   TSH 1.290 02/16/2020      ASSESSMENT AND PLAN 70 y.o. year old male  has a past medical history of Anemia, Barrett esophagus, Chronic kidney disease (CKD), stage III (moderate) ([place]), Dementia ([place]), Diabetes mellitus ([place]), Esophageal cancer ([place]), Gastroparesis, GERD (gastroesophageal reflux disease), History of MRSA infection, Hydrocephalus ([place]), Hypertension, Parkinson's disease ([place]), Pneumonia, and Sleep apnea. here with :   1.  Normal pressure hydrocephalus  -- MMSE score has improved 20/30 previously 16/30.  --Gait is stable if not slightly improved --Patient has had 20 pound weight loss saw PCP yesterday but did not address weight loss with the PCP.  Possible depression as well? -- Advised that they should follow-up with PCP to discuss these concerns --I discussed plan of care with  Dr. Jaynee Eagles  -- Patient will follow-up in 1 year for follow-up.     Ward Givens, MSN, NP-C 11/23/2020, 11:10 AM [facility] [address] [place], [place] [address] [phone]  agree with assessment and plan as stated.     Sarina Ill, MD [facility]

## 2020-11-23 NOTE — Patient Instructions (Signed)
Your Plan:  Follow-up with PCP regarding weight loss If your symptoms worsen or you develop new symptoms please let us know.   Thank you for coming to see [place] at [facility]. I hope we have been able to provide you high quality care today.  You may receive a patient satisfaction survey over the next few weeks. We would appreciate your feedback and comments so that we may continue to improve ourselves and the health of our patients.

## 2020-12-28 ENCOUNTER — Other Ambulatory Visit: Payer: Self-pay | Admitting: Internal Medicine

## 2020-12-28 DIAGNOSIS — R634 Abnormal weight loss: Secondary | ICD-10-CM

## 2020-12-29 ENCOUNTER — Non-Acute Institutional Stay: Payer: Self-pay | Admitting: *Deleted

## 2020-12-29 ENCOUNTER — Non-Acute Institutional Stay: Payer: Self-pay

## 2020-12-29 ENCOUNTER — Other Ambulatory Visit: Payer: Self-pay

## 2020-12-29 VITALS — BP 95/67 | HR 93 | Temp 97.7°F | Resp 17

## 2020-12-29 DIAGNOSIS — Z515 Encounter for palliative care: Secondary | ICD-10-CM

## 2021-01-01 NOTE — Progress Notes (Signed)
[facility] PALLIATIVE CARE RN NOTE  PATIENT NAME: [name] DOB: 1951/03/23 MRN: [id number]  PRIMARY CARE PROVIDER: [name], MD  RESPONSIBLE PARTY: Vesta Mixer (wife) Acct ID - Guarantor Home Phone Work Phone Relationship Acct Type  [id number] KEYSHAUN, EXLEY* [phone]  Self P/F     [address], [place], [place], [place] [address]   Covid-19 Pre-screening Negative  PLAN OF CARE and INTERVENTION:  ADVANCE CARE PLANNING/GOALS OF CARE: Goal is for patient to remain in his apartment at [place]. He is a Full code. PATIENT/CAREGIVER EDUCATION: Explained palliative care services and each disciplines role in care, symptom management, safe mobility DISEASE STATUS: Joint initial palliative care visit completed with LCSW, M. Lonon. Met with patient and his wife, [name] in their apartment. Upon arrival, patient is sitting on the bed awake and alert. He is able to answer most questions, however he does have some scattered thoughts and nonsensical speech on occasion. His voice goes hoarse at times. He denies pain at this time. His breathing is regular and unlabored. Their biggest concern is patient's continual weight loss. He is very thin/frail in appearance. He is 6'5 and weighs 129 lbs with a BMI of 15.3. His appetite continues to deteriorate. His wife prepares 3 meals/daily but patient usually eats about two per day. He has early satiety. He had a danish for breakfast and completed the last bite during our visit at lunch time. Wife does not have a facility meal plan and prepares all of the meals at this time. She does try to get him to drink Boost. He has a CT scan coming up on 01/15/21. Wife states that she has to call to make an appointment for an abdominal ultrasound on Monday. He denies abdominal pain, nausea or vomiting. He is ambulatory using a walker. He says that he could benefit from assistance in helping him bathe in the morning. Wife is supposed to know  more about her financial situation at the end of the month in order to possibly be able to pay for in-home care assistance. He is currently receiving PT/OT/ST by [facility] and has been for about a month. No recent falls. He does have some bowel incontinence and wears Depends. Wife says a urine specimen was taken to his PCPs office this week and she has not heard back so assumes it was negative. He is sleeping throughout most of the day and on and off during the night. When he first arrived at [facility] in May 2022, he was very active, walking without a walker and participation in facility activities. He then started to regress and is no longer doing this. He does experience some dizziness upon standing. His blood pressure is 95/67 today. I encouraged him to drink fluids when possible. I was unable to obtain an oxygen saturation on either hand. Hands are cool to touch. Wife says there are always issues with this. No skin issues. She is agreeable to future visits by palliative care. Consent signed.   HISTORY OF PRESENT ILLNESS:  This is a 70 yo male with a history of Barrett esophagus, GERD, hypertension, DM II, normal pressure hydrocephalus, CKD Stage 3, protein calorie malnutrition. Palliative care team was asked to follow patient for symptom management, goal of care and complex decision making.   CODE STATUS: Full code ADVANCED DIRECTIVES: Y MOST FORM: no PPS: 50%   PHYSICAL EXAM:   VITALS: Today's Vitals   12/29/20 1250  BP: 95/67  Pulse: 93  Resp: 17  Temp: 97.7 F (36.5 C)  TempSrc: Temporal  PainSc: 0-No pain    LUNGS: clear to auscultation  CARDIAC: Cor RRR EXTREMITIES: No edema SKIN:  Exposed skin is dry and intact   NEURO:  Alert and oriented to person/place, some confusion, increased generalized weakness, ambulatory w/walker   (Duration of visit and documentation 60 minutes)   Daryl Eastern, RN BSN

## 2021-01-02 NOTE — Progress Notes (Signed)
[facility] PALLIATIVE CARE SW NOTE  PATIENT NAME: [name] DOB: 1950-09-11 MRN: [id number]  PRIMARY CARE PROVIDER: [name], MD  RESPONSIBLE PARTY:  Acct ID - Guarantor Home Phone Work Phone Relationship Acct Type  [id number] - Stepney,J* [phone]  Self P/F     [address] [place], [place] [address]     PLAN OF CARE and INTERVENTIONS:             GOALS OF CARE/ ADVANCE CARE PLANNING:  Goal is for patient to remain in his apartment with his wife. Patient is a FULL CODE.  SOCIAL/EMOTIONAL/SPIRITUAL ASSESSMENT/ INTERVENTIONS:  SW and RN-M. Nadara Mustard completed a face-to-face visit with patient and his wife-Janice and their independent apartment at [facility]. Education was provided about palliative care services, role in patient's care, visit frequency and contact information. Patient and his wife provided verbal and written consent  for ongoing services. Patient and his wife provided a status and social update. Patient appeared to be alert and oriented x3, but had some intermittent of fogginess and scattered verbalizations. He has some intermittent hoarseness. He denies pain. Patient and his wife verbalized that patient has had persistent weight loss. He is thin, frail and disshelved in appearance. Patient reports a height of 6'5 and 129 lbs. He has a poor appetite, usually taking several  hours to finish a meal. His wife report that she has to constantly remind and encourage him to eat. His wife reports that she prepares three meals for him, but they are prepared in the microwave.She has also added at least 1 Boost drink and  encourages him to drink daily. He ambulates with a walker. He also experiences dizziness when he stands. Patient desire assistance with personal care, however don't feel they have the finances to hire someone. However, they are expecting some financial assistance through their church and would like revisit this at another time. They would also  like assistance with obtaining incontinence supplies-SW will seek donations through the volunteer program. SOCIAL HISTORY: Patient moved into [facility] in May 2022. He and his wife have been married for over 55 years. They had one son, that is deceased. They have a granddaughter that they are very proud of. Patient was born in [place]. He worked as a Firefighter. Patient has no advance directives and are not interested in completing. Patient is a Full Code. SW provided supportive presence, active listening, assessment of patient's needs, comfort and status, coping and needs of PCG, and reassurance of support. Patient and wife are open to ongoing palliative care visits and support.   PATIENT/CAREGIVER EDUCATION/ COPING:  Patient and his wife appear to be coping well. They have a supportive network of friends and church members.  PERSONAL EMERGENCY PLAN:  Per facility protocol COMMUNITY RESOURCES COORDINATION/ HEALTH CARE NAVIGATION:  Patient is currently receiving PT/OT through in-house therapy, [facility]. FINANCIAL/LEGAL CONCERNS/INTERVENTIONS: Patient and wife are experiencing financial difficulties as they experienced homelessness prior to moving into [place].     SOCIAL HX:  Social History   Tobacco Use   Smoking status: Never   Smokeless tobacco: Never  Substance Use Topics   Alcohol use: Yes    Comment: occasionally drinks Bailey's    CODE STATUS: Full Code ADVANCED DIRECTIVES: No MOST FORM COMPLETE:  No HOSPICE EDUCATION PROVIDED: No  PPS: Patient is alert and oriented x3, but has some intermittent dizziness and confusion. He ambulates with his walker.   Duration of visit and documentation: 60 minutes  [address] [place], [place]

## 2021-01-15 ENCOUNTER — Ambulatory Visit
Admission: RE | Admit: 2021-01-15 | Discharge: 2021-01-15 | Disposition: A | Discharge status: Home / Self Care | Payer: Medicare PPO | Source: Ambulatory Visit | Attending: Internal Medicine | Admitting: Internal Medicine

## 2021-01-15 DIAGNOSIS — R634 Abnormal weight loss: Secondary | ICD-10-CM

## 2021-01-15 MED ORDER — IOPAMIDOL (ISOVUE-300) INJECTION 61%
100.0000 mL | Freq: Once | INTRAVENOUS | Status: AC | PRN
  Administered 2021-01-15: 100 mL via INTRAVENOUS

## 2021-01-18 DIAGNOSIS — R488 Other symbolic dysfunctions: Secondary | ICD-10-CM | POA: Diagnosis not present

## 2021-01-18 DIAGNOSIS — R41841 Cognitive communication deficit: Secondary | ICD-10-CM | POA: Diagnosis not present

## 2021-01-18 DIAGNOSIS — Z20828 Contact with and (suspected) exposure to other viral communicable diseases: Secondary | ICD-10-CM | POA: Diagnosis not present

## 2021-01-18 DIAGNOSIS — M6281 Muscle weakness (generalized): Secondary | ICD-10-CM | POA: Diagnosis not present

## 2021-01-19 DIAGNOSIS — M6281 Muscle weakness (generalized): Secondary | ICD-10-CM | POA: Diagnosis not present

## 2021-01-19 DIAGNOSIS — D4102 Neoplasm of uncertain behavior of left kidney: Secondary | ICD-10-CM | POA: Diagnosis not present

## 2021-01-19 DIAGNOSIS — R262 Difficulty in walking, not elsewhere classified: Secondary | ICD-10-CM | POA: Diagnosis not present

## 2021-01-19 DIAGNOSIS — D4101 Neoplasm of uncertain behavior of right kidney: Secondary | ICD-10-CM | POA: Diagnosis not present

## 2021-01-22 DIAGNOSIS — M6281 Muscle weakness (generalized): Secondary | ICD-10-CM | POA: Diagnosis not present

## 2021-01-22 DIAGNOSIS — R262 Difficulty in walking, not elsewhere classified: Secondary | ICD-10-CM | POA: Diagnosis not present

## 2021-01-23 DIAGNOSIS — R262 Difficulty in walking, not elsewhere classified: Secondary | ICD-10-CM | POA: Diagnosis not present

## 2021-01-23 DIAGNOSIS — M6281 Muscle weakness (generalized): Secondary | ICD-10-CM | POA: Diagnosis not present

## 2021-01-23 DIAGNOSIS — R488 Other symbolic dysfunctions: Secondary | ICD-10-CM | POA: Diagnosis not present

## 2021-01-23 DIAGNOSIS — R41841 Cognitive communication deficit: Secondary | ICD-10-CM | POA: Diagnosis not present

## 2021-01-24 DIAGNOSIS — M6281 Muscle weakness (generalized): Secondary | ICD-10-CM | POA: Diagnosis not present

## 2021-01-24 DIAGNOSIS — R262 Difficulty in walking, not elsewhere classified: Secondary | ICD-10-CM | POA: Diagnosis not present

## 2021-01-24 DIAGNOSIS — R41841 Cognitive communication deficit: Secondary | ICD-10-CM | POA: Diagnosis not present

## 2021-01-24 DIAGNOSIS — R488 Other symbolic dysfunctions: Secondary | ICD-10-CM | POA: Diagnosis not present

## 2021-01-25 DIAGNOSIS — R488 Other symbolic dysfunctions: Secondary | ICD-10-CM | POA: Diagnosis not present

## 2021-01-25 DIAGNOSIS — R41841 Cognitive communication deficit: Secondary | ICD-10-CM | POA: Diagnosis not present

## 2021-01-25 DIAGNOSIS — M6281 Muscle weakness (generalized): Secondary | ICD-10-CM | POA: Diagnosis not present

## 2021-01-25 DIAGNOSIS — R262 Difficulty in walking, not elsewhere classified: Secondary | ICD-10-CM | POA: Diagnosis not present

## 2021-01-25 DIAGNOSIS — Z8616 Personal history of COVID-19: Secondary | ICD-10-CM | POA: Diagnosis not present

## 2021-01-26 DIAGNOSIS — R488 Other symbolic dysfunctions: Secondary | ICD-10-CM | POA: Diagnosis not present

## 2021-01-26 DIAGNOSIS — R41841 Cognitive communication deficit: Secondary | ICD-10-CM | POA: Diagnosis not present

## 2021-01-26 DIAGNOSIS — M6281 Muscle weakness (generalized): Secondary | ICD-10-CM | POA: Diagnosis not present

## 2021-01-26 DIAGNOSIS — R262 Difficulty in walking, not elsewhere classified: Secondary | ICD-10-CM | POA: Diagnosis not present

## 2021-01-27 DIAGNOSIS — R262 Difficulty in walking, not elsewhere classified: Secondary | ICD-10-CM | POA: Diagnosis not present

## 2021-01-27 DIAGNOSIS — M6281 Muscle weakness (generalized): Secondary | ICD-10-CM | POA: Diagnosis not present

## 2021-01-30 ENCOUNTER — Other Ambulatory Visit: Payer: Self-pay

## 2021-01-30 DIAGNOSIS — M6281 Muscle weakness (generalized): Secondary | ICD-10-CM | POA: Diagnosis not present

## 2021-01-30 DIAGNOSIS — Z515 Encounter for palliative care: Secondary | ICD-10-CM

## 2021-01-30 DIAGNOSIS — R262 Difficulty in walking, not elsewhere classified: Secondary | ICD-10-CM | POA: Diagnosis not present

## 2021-01-30 DIAGNOSIS — R488 Other symbolic dysfunctions: Secondary | ICD-10-CM | POA: Diagnosis not present

## 2021-01-30 DIAGNOSIS — R41841 Cognitive communication deficit: Secondary | ICD-10-CM | POA: Diagnosis not present

## 2021-01-30 NOTE — Progress Notes (Signed)
[facility] PALLIATIVE CARE SW NOTE  PATIENT NAME: [name] DOB: 12/16/1950 MRN: [id number]  PRIMARY CARE PROVIDER: [name], MD  RESPONSIBLE PARTY:  Acct ID - Guarantor Home Phone Work Phone Relationship Acct Type  [id number] - Gladd,J* [phone]  Self P/F     [address], [place], [place], [place] [address]     PLAN OF CARE and INTERVENTIONS:             GOALS OF CARE/ ADVANCE CARE PLANNING:  Goal is for patient to remain in his current setting with his wife. Patient is a full code.  SOCIAL/EMOTIONAL/SPIRITUAL ASSESSMENT/ INTERVENTIONS: SW completed a visit with patient at his apartment. He was present with his wife. Patient was in bed, awake, but quiet. SW provided donated incontinent supplies to him. His wife reported that patient had a CT scan that showed he has a mass on his kidneys. He is scheduled to have a MRI next month and hopefully they will learn more. She report that patient has been feeling more tired, but was doing okay. Patient remains very thin and frail in appearance. His appetite continues to decline.PCG has lost her cell phone, so she provided patient's cell phone number for future contact. SW updated palliative care RN. Patient and PCG remain open to ongoing palliative care support.  PATIENT/CAREGIVER EDUCATION/ COPING:  Patient and PCG appear to be coping well.  PERSONAL EMERGENCY PLAN:  Per facility protocol. COMMUNITY RESOURCES COORDINATION/ HEALTH CARE NAVIGATION:  Patient continues with therapy. FINANCIAL/LEGAL CONCERNS/INTERVENTIONS:  None.      SOCIAL HX:  Social History   Tobacco Use   Smoking status: Never   Smokeless tobacco: Never  Substance Use Topics   Alcohol use: Yes    Comment: occasionally drinks Bailey's    CODE STATUS: Full Code ADVANCED DIRECTIVES: No MOST FORM COMPLETE:  No HOSPICE EDUCATION PROVIDED: No  PPS: Patient is alert and oriented x3, but has some intermittent dizziness and confusion. He ambulates  with his walker.    Duration of visit and documentation: 30 minutes      Katheren Puller, LCSW

## 2021-01-31 DIAGNOSIS — R41841 Cognitive communication deficit: Secondary | ICD-10-CM | POA: Diagnosis not present

## 2021-01-31 DIAGNOSIS — R262 Difficulty in walking, not elsewhere classified: Secondary | ICD-10-CM | POA: Diagnosis not present

## 2021-01-31 DIAGNOSIS — R488 Other symbolic dysfunctions: Secondary | ICD-10-CM | POA: Diagnosis not present

## 2021-01-31 DIAGNOSIS — M6281 Muscle weakness (generalized): Secondary | ICD-10-CM | POA: Diagnosis not present

## 2021-02-01 DIAGNOSIS — M6281 Muscle weakness (generalized): Secondary | ICD-10-CM | POA: Diagnosis not present

## 2021-02-01 DIAGNOSIS — Z20828 Contact with and (suspected) exposure to other viral communicable diseases: Secondary | ICD-10-CM | POA: Diagnosis not present

## 2021-02-02 DIAGNOSIS — R262 Difficulty in walking, not elsewhere classified: Secondary | ICD-10-CM | POA: Diagnosis not present

## 2021-02-02 DIAGNOSIS — R488 Other symbolic dysfunctions: Secondary | ICD-10-CM | POA: Diagnosis not present

## 2021-02-02 DIAGNOSIS — R41841 Cognitive communication deficit: Secondary | ICD-10-CM | POA: Diagnosis not present

## 2021-02-02 DIAGNOSIS — M6281 Muscle weakness (generalized): Secondary | ICD-10-CM | POA: Diagnosis not present

## 2021-02-03 ENCOUNTER — Emergency Department (HOSPITAL_COMMUNITY)
Admission: EM | Admit: 2021-02-03 | Discharge: 2021-02-03 | Disposition: A | Discharge status: Home / Self Care | Payer: Medicare PPO | Attending: Emergency Medicine | Admitting: Emergency Medicine

## 2021-02-03 ENCOUNTER — Other Ambulatory Visit: Payer: Self-pay

## 2021-02-03 ENCOUNTER — Emergency Department (HOSPITAL_COMMUNITY): Payer: Medicare PPO

## 2021-02-03 DIAGNOSIS — Z743 Need for continuous supervision: Secondary | ICD-10-CM | POA: Diagnosis not present

## 2021-02-03 DIAGNOSIS — F0390 Unspecified dementia without behavioral disturbance: Secondary | ICD-10-CM | POA: Diagnosis not present

## 2021-02-03 DIAGNOSIS — G20 Parkinson's disease: Secondary | ICD-10-CM | POA: Insufficient documentation

## 2021-02-03 DIAGNOSIS — S0181XA Laceration without foreign body of other part of head, initial encounter: Secondary | ICD-10-CM | POA: Insufficient documentation

## 2021-02-03 DIAGNOSIS — R5383 Other fatigue: Secondary | ICD-10-CM | POA: Diagnosis not present

## 2021-02-03 DIAGNOSIS — N183 Chronic kidney disease, stage 3 unspecified: Secondary | ICD-10-CM | POA: Insufficient documentation

## 2021-02-03 DIAGNOSIS — W19XXXA Unspecified fall, initial encounter: Secondary | ICD-10-CM | POA: Insufficient documentation

## 2021-02-03 DIAGNOSIS — Z8501 Personal history of malignant neoplasm of esophagus: Secondary | ICD-10-CM | POA: Insufficient documentation

## 2021-02-03 DIAGNOSIS — Z7984 Long term (current) use of oral hypoglycemic drugs: Secondary | ICD-10-CM | POA: Insufficient documentation

## 2021-02-03 DIAGNOSIS — Z79899 Other long term (current) drug therapy: Secondary | ICD-10-CM | POA: Diagnosis not present

## 2021-02-03 DIAGNOSIS — S0990XA Unspecified injury of head, initial encounter: Secondary | ICD-10-CM | POA: Diagnosis not present

## 2021-02-03 DIAGNOSIS — R4182 Altered mental status, unspecified: Secondary | ICD-10-CM | POA: Diagnosis not present

## 2021-02-03 DIAGNOSIS — S0180XA Unspecified open wound of other part of head, initial encounter: Secondary | ICD-10-CM | POA: Diagnosis not present

## 2021-02-03 DIAGNOSIS — E1122 Type 2 diabetes mellitus with diabetic chronic kidney disease: Secondary | ICD-10-CM | POA: Insufficient documentation

## 2021-02-03 DIAGNOSIS — Y92129 Unspecified place in nursing home as the place of occurrence of the external cause: Secondary | ICD-10-CM | POA: Diagnosis not present

## 2021-02-03 DIAGNOSIS — I129 Hypertensive chronic kidney disease with stage 1 through stage 4 chronic kidney disease, or unspecified chronic kidney disease: Secondary | ICD-10-CM | POA: Diagnosis not present

## 2021-02-03 DIAGNOSIS — R9082 White matter disease, unspecified: Secondary | ICD-10-CM | POA: Diagnosis not present

## 2021-02-03 DIAGNOSIS — R58 Hemorrhage, not elsewhere classified: Secondary | ICD-10-CM | POA: Diagnosis not present

## 2021-02-03 NOTE — ED Triage Notes (Signed)
Pt arrives via [facility] from [facility] for a fall. Pt has noted laceration to forehead. Denies LOC, blood thinner use.   EMS last VS - 110/80, HR 80, RR 18, CBG 139

## 2021-02-03 NOTE — ED Provider Notes (Signed)
[facility] DEPT Provider Note   CSN: [id number] Arrival date & time: 02/03/21  0831     History Chief Complaint  Patient presents with   Fall   Head Laceration    [name] is a 70 y.o. male.  70 year old male who had a witnessed fall today at home.  Patient does not take any blood thinners.  [address] and struck his forehead.  No LOC.  Complains of laceration to his forehead.  Denies any neck pain.  No headache.  No discomfort from the waist below.  No new focal deficits.      Past Medical History:  Diagnosis Date   Anemia    Barrett esophagus    Chronic kidney disease (CKD), stage III (moderate) (HCC)    Dementia (HCC)    Diabetes mellitus ([place])    Esophageal cancer (HCC)    Gastroparesis    GERD (gastroesophageal reflux disease)    History of MRSA infection    Hydrocephalus (HCC)    Hypertension    Parkinson's disease ([place])    Pneumonia    Sleep apnea    no longer uses a cpap    Patient Active Problem List   Diagnosis Date Noted   Normal pressure hydrocephalus ([place]) 08/14/2020   GERD (gastroesophageal reflux disease) 06/28/2020   Malnutrition of moderate degree 06/27/2020   Type 2 diabetes mellitus with hyperglycemia ([place]) 06/25/2020   Lobar pneumonia ([place]) 06/25/2020   Fall at home, initial encounter 06/25/2020   Cognitive impairment 06/25/2020   Sepsis secondary to UTI ([place]) 06/24/2020   Barrett esophagus    Mild protein malnutrition (HCC)    Diabetes mellitus ([place])    Hypertension    Chronic kidney disease (CKD), stage III (moderate) (HCC)     Past Surgical History:  Procedure Laterality Date   Left VATS, Left thoracoabdominal esophagogastrectomy with jejunostomy and pyloroplasty     04/24/2004 Dr Arlyce Dice   LUMBAR PUNCTURE  02/15/2020   PLACEMENT OF LUMBAR DRAIN N/A 08/14/2020   Procedure: PLACEMENT OF LUMBAR DRAIN;  Surgeon: Judith Part, MD;  Location: [facility];  Service: Neurosurgery;  Laterality: N/A;   R  cataract and retinal tears     SHUNT REPLACEMENT N/A 09/11/2020   Procedure: Placement of Lumboperitoneal Shunt;  Surgeon: Judith Part, MD;  Location: [facility];  Service: Neurosurgery;  Laterality: N/A;       Family History  Problem Relation Age of Onset   Cancer Mother        colon, breast   Heart disease Father    Diabetes Other        FX HX   Stroke Paternal Aunt    Dementia Neg Hx    Parkinson's disease Neg Hx     Social History   Tobacco Use   Smoking status: Never   Smokeless tobacco: Never  Vaping Use   Vaping Use: Never used  Substance Use Topics   Alcohol use: Yes    Comment: occasionally drinks Bailey's   Drug use: No    Home Medications Prior to Admission medications   Medication Sig Start Date End Date Taking? Authorizing Provider  esomeprazole (NEXIUM) 40 MG capsule Take 40 mg by mouth daily before breakfast.    [provider]  Iron-FA-B Cmp-C-Biot-Probiotic (FUSION PLUS) CAPS Take 1 capsule by mouth daily. 06/01/20   [provider]  metFORMIN (GLUCOPHAGE) 1000 MG tablet Take 1,000 mg by mouth 2 (two) times daily. 08/30/20   [provider]  Multiple Vitamin (MULTIVITAMIN WITH MINERALS) TABS tablet Take 1 tablet by mouth daily. Centrum    [provider]  quinapril-hydrochlorothiazide (ACCURETIC) 10-12.5 MG per tablet Take 1 tablet by mouth daily.    [provider]    Allergies    Duragen [estradiol valerate], Fentanyl, and Morphine and related  Review of Systems   Review of Systems  All other systems reviewed and are negative.  Physical Exam Updated Vital Signs BP 116/74   Pulse 73   Temp 97.8 F (36.6 C) (Oral)   Resp 18   SpO2 100%   Physical Exam Vitals and nursing note reviewed.  Constitutional:      General: He is not in acute distress.    Appearance: Normal appearance. He is well-developed. He is not toxic-appearing.  HENT:     Head:   Eyes:     General: Lids are normal.      Conjunctiva/sclera: Conjunctivae normal.     Pupils: Pupils are equal, round, and reactive to light.  Neck:     Thyroid: No thyroid mass.     Trachea: No tracheal deviation.  Cardiovascular:     Rate and Rhythm: Normal rate and regular rhythm.     Heart sounds: Normal heart sounds. No murmur heard.   No gallop.  Pulmonary:     Effort: Pulmonary effort is normal. No respiratory distress.     Breath sounds: Normal breath sounds. No stridor. No decreased breath sounds, wheezing, rhonchi or rales.  Abdominal:     General: There is no distension.     Palpations: Abdomen is soft.     Tenderness: There is no abdominal tenderness. There is no rebound.  Musculoskeletal:        General: No tenderness. Normal range of motion.     Cervical back: Normal range of motion and neck supple.  Skin:    General: Skin is warm and dry.     Findings: No abrasion or rash.  Neurological:     General: No focal deficit present.     Mental Status: He is alert and oriented to person, place, and time. Mental status is at baseline.     GCS: GCS eye subscore is 4. GCS verbal subscore is 5. GCS motor subscore is 6.     Cranial Nerves: Cranial nerves are intact. No cranial nerve deficit.     Sensory: No sensory deficit.     Motor: Motor function is intact.  Psychiatric:        Attention and Perception: Attention normal.        Speech: Speech normal.        Behavior: Behavior normal.    ED Results / Procedures / Treatments   Labs (all labs ordered are listed, but only abnormal results are displayed) Labs Reviewed - No data to display  EKG None  Radiology No results found.  Procedures .Marland KitchenLaceration Repair  Date/Time: 02/03/2021 10:58 AM Performed by: Lacretia Leigh, MD Authorized by: Lacretia Leigh, MD   Consent:    Consent obtained:  Verbal   Consent given by:  Patient   Risks discussed:  Infection Universal protocol:    Immediately prior to procedure, a time out was called: yes     Patient  identity confirmed:  Arm band Laceration details:    Location:  Face   Face location:  Forehead   Length (cm):  1.5 Pre-procedure details:    Preparation:  Patient was prepped and draped in usual sterile fashion and imaging obtained to  evaluate for foreign bodies Exploration:    Limited defect created (wound extended): no     Hemostasis achieved with:  Direct pressure   Contaminated: no   Treatment:    Area cleansed with:  Saline   Amount of cleaning:  Standard   Irrigation solution:  Tap water   Visualized foreign bodies/material removed: no     Undermining:  None   Scar revision: no   Skin repair:    Repair method:  Tissue adhesive Approximation:    Approximation:  Close Repair type:    Repair type:  Simple Post-procedure details:    Dressing:  Open (no dressing)   Procedure completion:  Tolerated   Medications Ordered in ED Medications - No data to display  ED Course  I have reviewed the triage vital signs and the nursing notes.  Pertinent labs & imaging results that were available during my care of the patient were reviewed by me and considered in my medical decision making (see chart for details).    MDM Rules/Calculators/A&P                           Head CT negative.  Patient's tetanus is updated.  Will discharge Final Clinical Impression(s) / ED Diagnoses Final diagnoses:  None    Rx / DC Orders ED Discharge Orders     None        Lacretia Leigh, MD 02/03/21 1100

## 2021-02-03 NOTE — ED Notes (Signed)
Attempted to call pt's wife multiple times to no avail. [facility] called to give report. [facility] contacted for transport.

## 2021-02-05 DIAGNOSIS — M6281 Muscle weakness (generalized): Secondary | ICD-10-CM | POA: Diagnosis not present

## 2021-02-05 DIAGNOSIS — Z20828 Contact with and (suspected) exposure to other viral communicable diseases: Secondary | ICD-10-CM | POA: Diagnosis not present

## 2021-02-06 DIAGNOSIS — R262 Difficulty in walking, not elsewhere classified: Secondary | ICD-10-CM | POA: Diagnosis not present

## 2021-02-06 DIAGNOSIS — M6281 Muscle weakness (generalized): Secondary | ICD-10-CM | POA: Diagnosis not present

## 2021-02-07 DIAGNOSIS — M6281 Muscle weakness (generalized): Secondary | ICD-10-CM | POA: Diagnosis not present

## 2021-02-08 DIAGNOSIS — Z20828 Contact with and (suspected) exposure to other viral communicable diseases: Secondary | ICD-10-CM | POA: Diagnosis not present

## 2021-02-08 DIAGNOSIS — R262 Difficulty in walking, not elsewhere classified: Secondary | ICD-10-CM | POA: Diagnosis not present

## 2021-02-08 DIAGNOSIS — M6281 Muscle weakness (generalized): Secondary | ICD-10-CM | POA: Diagnosis not present

## 2021-02-09 DIAGNOSIS — M6281 Muscle weakness (generalized): Secondary | ICD-10-CM | POA: Diagnosis not present

## 2021-02-09 DIAGNOSIS — R488 Other symbolic dysfunctions: Secondary | ICD-10-CM | POA: Diagnosis not present

## 2021-02-09 DIAGNOSIS — R262 Difficulty in walking, not elsewhere classified: Secondary | ICD-10-CM | POA: Diagnosis not present

## 2021-02-09 DIAGNOSIS — R41841 Cognitive communication deficit: Secondary | ICD-10-CM | POA: Diagnosis not present

## 2021-02-10 DIAGNOSIS — R41841 Cognitive communication deficit: Secondary | ICD-10-CM | POA: Diagnosis not present

## 2021-02-10 DIAGNOSIS — R488 Other symbolic dysfunctions: Secondary | ICD-10-CM | POA: Diagnosis not present

## 2021-02-12 DIAGNOSIS — R262 Difficulty in walking, not elsewhere classified: Secondary | ICD-10-CM | POA: Diagnosis not present

## 2021-02-12 DIAGNOSIS — R41841 Cognitive communication deficit: Secondary | ICD-10-CM | POA: Diagnosis not present

## 2021-02-12 DIAGNOSIS — R488 Other symbolic dysfunctions: Secondary | ICD-10-CM | POA: Diagnosis not present

## 2021-02-12 DIAGNOSIS — M6281 Muscle weakness (generalized): Secondary | ICD-10-CM | POA: Diagnosis not present

## 2021-02-13 ENCOUNTER — Telehealth: Payer: Self-pay

## 2021-02-13 DIAGNOSIS — R262 Difficulty in walking, not elsewhere classified: Secondary | ICD-10-CM | POA: Diagnosis not present

## 2021-02-13 DIAGNOSIS — M6281 Muscle weakness (generalized): Secondary | ICD-10-CM | POA: Diagnosis not present

## 2021-02-13 NOTE — Telephone Encounter (Signed)
Received message to call patient's wife. Wife left message sharing that she needs more help caring for patient. VM left with call back information. Also responded to email sent by patient's wife.

## 2021-02-14 DIAGNOSIS — R41841 Cognitive communication deficit: Secondary | ICD-10-CM | POA: Diagnosis not present

## 2021-02-14 DIAGNOSIS — R262 Difficulty in walking, not elsewhere classified: Secondary | ICD-10-CM | POA: Diagnosis not present

## 2021-02-14 DIAGNOSIS — R488 Other symbolic dysfunctions: Secondary | ICD-10-CM | POA: Diagnosis not present

## 2021-02-14 DIAGNOSIS — M6281 Muscle weakness (generalized): Secondary | ICD-10-CM | POA: Diagnosis not present

## 2021-02-15 DIAGNOSIS — M6281 Muscle weakness (generalized): Secondary | ICD-10-CM | POA: Diagnosis not present

## 2021-02-16 DIAGNOSIS — R262 Difficulty in walking, not elsewhere classified: Secondary | ICD-10-CM | POA: Diagnosis not present

## 2021-02-16 DIAGNOSIS — M6281 Muscle weakness (generalized): Secondary | ICD-10-CM | POA: Diagnosis not present

## 2021-02-17 DIAGNOSIS — R262 Difficulty in walking, not elsewhere classified: Secondary | ICD-10-CM | POA: Diagnosis not present

## 2021-02-17 DIAGNOSIS — M6281 Muscle weakness (generalized): Secondary | ICD-10-CM | POA: Diagnosis not present

## 2021-02-19 DIAGNOSIS — M6281 Muscle weakness (generalized): Secondary | ICD-10-CM | POA: Diagnosis not present

## 2021-02-20 DIAGNOSIS — N2889 Other specified disorders of kidney and ureter: Secondary | ICD-10-CM | POA: Diagnosis not present

## 2021-02-20 DIAGNOSIS — Z8616 Personal history of COVID-19: Secondary | ICD-10-CM | POA: Diagnosis not present

## 2021-02-20 DIAGNOSIS — R9389 Abnormal findings on diagnostic imaging of other specified body structures: Secondary | ICD-10-CM | POA: Diagnosis not present

## 2021-02-20 DIAGNOSIS — E440 Moderate protein-calorie malnutrition: Secondary | ICD-10-CM | POA: Diagnosis not present

## 2021-02-20 DIAGNOSIS — N1831 Chronic kidney disease, stage 3a: Secondary | ICD-10-CM | POA: Diagnosis not present

## 2021-02-21 DIAGNOSIS — R262 Difficulty in walking, not elsewhere classified: Secondary | ICD-10-CM | POA: Diagnosis not present

## 2021-02-21 DIAGNOSIS — M6281 Muscle weakness (generalized): Secondary | ICD-10-CM | POA: Diagnosis not present

## 2021-02-22 DIAGNOSIS — M6281 Muscle weakness (generalized): Secondary | ICD-10-CM | POA: Diagnosis not present

## 2021-02-22 DIAGNOSIS — R262 Difficulty in walking, not elsewhere classified: Secondary | ICD-10-CM | POA: Diagnosis not present

## 2021-02-22 DIAGNOSIS — Z8616 Personal history of COVID-19: Secondary | ICD-10-CM | POA: Diagnosis not present

## 2021-02-23 DIAGNOSIS — M6281 Muscle weakness (generalized): Secondary | ICD-10-CM | POA: Diagnosis not present

## 2021-02-23 DIAGNOSIS — R262 Difficulty in walking, not elsewhere classified: Secondary | ICD-10-CM | POA: Diagnosis not present

## 2021-02-26 DIAGNOSIS — M6281 Muscle weakness (generalized): Secondary | ICD-10-CM | POA: Diagnosis not present

## 2021-02-26 DIAGNOSIS — R262 Difficulty in walking, not elsewhere classified: Secondary | ICD-10-CM | POA: Diagnosis not present

## 2021-02-27 DIAGNOSIS — D4102 Neoplasm of uncertain behavior of left kidney: Secondary | ICD-10-CM | POA: Diagnosis not present

## 2021-02-27 DIAGNOSIS — D4101 Neoplasm of uncertain behavior of right kidney: Secondary | ICD-10-CM | POA: Diagnosis not present

## 2021-02-28 ENCOUNTER — Other Ambulatory Visit: Payer: Self-pay | Admitting: Urology

## 2021-03-01 DIAGNOSIS — M6281 Muscle weakness (generalized): Secondary | ICD-10-CM | POA: Diagnosis not present

## 2021-03-01 DIAGNOSIS — R262 Difficulty in walking, not elsewhere classified: Secondary | ICD-10-CM | POA: Diagnosis not present

## 2021-03-01 DIAGNOSIS — Z8616 Personal history of COVID-19: Secondary | ICD-10-CM | POA: Diagnosis not present

## 2021-03-02 DIAGNOSIS — R262 Difficulty in walking, not elsewhere classified: Secondary | ICD-10-CM | POA: Diagnosis not present

## 2021-03-02 DIAGNOSIS — M6281 Muscle weakness (generalized): Secondary | ICD-10-CM | POA: Diagnosis not present

## 2021-03-05 DIAGNOSIS — M6281 Muscle weakness (generalized): Secondary | ICD-10-CM | POA: Diagnosis not present

## 2021-03-05 DIAGNOSIS — R262 Difficulty in walking, not elsewhere classified: Secondary | ICD-10-CM | POA: Diagnosis not present

## 2021-03-07 NOTE — Progress Notes (Addendum)
COVID swab appointment: 03-13-21  COVID Vaccine Completed: Yes x3  Date COVID Vaccine completed: 06-27-19, 07-22-19 Has received booster: 03-04-20 COVID vaccine manufacturer: Pfizer      Date of COVID positive in last 90 days:  N/A  PCP - [name], MD Cardiologist - N/A  Chest x-ray - 06-24-20  Epic EKG - 06-26-20 Epic Stress Test - N/A ECHO - N/A Cardiac Cath - N/A Pacemaker/ICD device last checked: Spinal Cord Stimulator:  Sleep Study - Yes, +sleep apnea CPAP - No  Fasting Blood Sugar - Does not check Checks Blood Sugar _____ times a day  Blood Thinner Instructions:  N/A Aspirin Instructions: Last Dose:  Activity level:  Unable to go up a flight of stairs due to weakness and dementia.  Needs assistance with activities of daily living due to dementia  Anesthesia review:  N/A  Patient denies shortness of breath, fever, cough and chest pain at PAT appointment   Patient verbalized understanding of instructions that were given to them at the PAT appointment. Patient was also instructed that they will need to review over the PAT instructions again at home before surgery.

## 2021-03-07 NOTE — Patient Instructions (Addendum)
DUE TO COVID-19 ONLY ONE VISITOR IS ALLOWED TO COME WITH YOU AND STAY IN THE WAITING ROOM ONLY DURING PRE OP AND PROCEDURE.   **NO VISITORS ARE ALLOWED IN THE SHORT STAY AREA OR RECOVERY ROOM!!**  IF YOU WILL BE ADMITTED INTO THE HOSPITAL YOU ARE ALLOWED ONLY TWO SUPPORT PEOPLE DURING VISITATION HOURS ONLY (7 AM -8PM)    Up to two visitors ages 75+ are allowed at one time in a patient's room.  The visitors may rotate out with other people throughout the day.  Additionally, up to two children between the ages of 77 and 101 are allowed and do not count toward the number of allowed visitors.  Children within this age range must be accompanied by an adult visitor.  One adult visitor may remain with the patient overnight and must be in the room by 8 PM.  COVID SWAB TESTING MUST BE COMPLETED ON:  Tuesday 03-13-21, Between the hours of 8 and 3  **MUST PRESENT COMPLETED FORM AT TESTING SITE**    [name]   (backside of the building)  You are not required to quarantine, however you are required to wear a well-fitted mask when you are out and around people not in your household.  Hand Hygiene often Do NOT share personal items Notify your provider if you are in close contact with someone who has COVID or you develop fever 100.4 or greater, new onset of sneezing, cough, sore throat, shortness of breath or body aches.        Your procedure is scheduled on:  Thursday, 03-15-21   Report to [facility] Main  Entrance     Report to admitting at 5:15 AM   Call this number if you have problems the morning of surgery [phone]   Do not eat food :After Midnight.   May have liquids until 4:30 AM day of surgery  CLEAR LIQUID DIET  Foods Allowed                                                                     Foods Excluded  Water, Black Coffee (no milk/no creamer) and tea, regular and decaf                              liquids that you cannot  Plain Jell-O in any  flavor  (No red)                         see through such as: Fruit ices (not with fruit pulp)                                 milk, soups, orange juice  Iced Popsicles (No red)                                    All solid food                             Apple juices  Sports drinks like Gatorade (No red) Lightly seasoned clear broth or consume(fat free) Sugar     Oral Hygiene is also important to reduce your risk of infection.                                    Remember - BRUSH YOUR TEETH THE MORNING OF SURGERY WITH YOUR REGULAR TOOTHPASTE   Do NOT smoke after Midnight   Take these medicines the morning of surgery with A SIP OF WATER:  Nexium   WHAT DO I DO ABOUT MY DIABETES MEDICATION?  Do not take oral diabetes medicines (pills) the morning of surgery.  THE DAY BEFORE SURGERY:  Take Metformin as prescribed.     THE MORNING OF SURGERY:  Do not take Metformin.  Reviewed and Endorsed by [facility] Patient Education Committee, August 2015                  Stop all vitamins and herbal supplements a week before surgery             You may not have any metal on your body including  jewelry, and body piercing             Do not wear lotions, powders, cologne, or deodorant              Men may shave face and neck.  Do not bring valuables to the hospital. [place].   Contacts, dentures or bridgework may not be worn into surgery.   Bring small overnight bag day of surgery.   Please read over the following fact sheets you were given: IF YOU HAVE QUESTIONS ABOUT YOUR PRE OP INSTRUCTIONS PLEASE CALL [facility] - Preparing for Surgery Before surgery, you can play an important role.  Because skin is not sterile, your skin needs to be as free of germs as possible.  You can reduce the number of germs on your skin by washing with CHG (chlorahexidine gluconate) soap before surgery.  CHG is an antiseptic cleaner which kills germs  and bonds with the skin to continue killing germs even after washing. Please DO NOT use if you have an allergy to CHG or antibacterial soaps.  If your skin becomes reddened/irritated stop using the CHG and inform your nurse when you arrive at Short Stay. Do not shave (including legs and underarms) for at least 48 hours prior to the first CHG shower.  You may shave your face/neck.  Please follow these instructions carefully:  1.  Shower with CHG Soap the night before surgery and the  morning of surgery.  2.  If you choose to wash your hair, wash your hair first as usual with your normal  shampoo.  3.  After you shampoo, rinse your hair and body thoroughly to remove the shampoo.                             4.  Use CHG as you would any other liquid soap.  You can apply chg directly to the skin and wash.  Gently with a scrungie or clean washcloth.  5.  Apply the CHG Soap to your body ONLY FROM THE NECK DOWN.   Do   not use on face/ open  Wound or open sores. Avoid contact with eyes, ears mouth and   genitals (private parts).                       Wash face,  Genitals (private parts) with your normal soap.             6.  Wash thoroughly, paying special attention to the area where your    surgery  will be performed.  7.  Thoroughly rinse your body with warm water from the neck down.  8.  DO NOT shower/wash with your normal soap after using and rinsing off the CHG Soap.                9.  Pat yourself dry with a clean towel.            10.  Wear clean pajamas.            11.  Place clean sheets on your bed the night of your first shower and do not  sleep with pets. Day of Surgery : Do not apply any lotions/deodorants the morning of surgery.  Please wear clean clothes to the hospital/surgery center.  FAILURE TO FOLLOW THESE INSTRUCTIONS MAY RESULT IN THE CANCELLATION OF YOUR SURGERY  PATIENT SIGNATURE_________________________________  NURSE  SIGNATURE__________________________________  ________________________________________________________________________  WHAT IS A BLOOD TRANSFUSION? Blood Transfusion Information  A transfusion is the replacement of blood or some of its parts. Blood is made up of multiple cells which provide different functions. Red blood cells carry oxygen and are used for blood loss replacement. White blood cells fight against infection. Platelets control bleeding. Plasma helps clot blood. Other blood products are available for specialized needs, such as hemophilia or other clotting disorders. BEFORE THE TRANSFUSION  Who gives blood for transfusions?  Healthy volunteers who are fully evaluated to make sure their blood is safe. This is blood bank blood. Transfusion therapy is the safest it has ever been in the practice of medicine. Before blood is taken from a donor, a complete history is taken to make sure that person has no history of diseases nor engages in risky social behavior (examples are intravenous drug use or sexual activity with multiple partners). The donor's travel history is screened to minimize risk of transmitting infections, such as malaria. The donated blood is tested for signs of infectious diseases, such as HIV and hepatitis. The blood is then tested to be sure it is compatible with you in order to minimize the chance of a transfusion reaction. If you or a relative donates blood, this is often done in anticipation of surgery and is not appropriate for emergency situations. It takes many days to process the donated blood. RISKS AND COMPLICATIONS Although transfusion therapy is very safe and saves many lives, the main dangers of transfusion include:  Getting an infectious disease. Developing a transfusion reaction. This is an allergic reaction to something in the blood you were given. Every precaution is taken to prevent this. The decision to have a blood transfusion has been considered carefully  by your caregiver before blood is given. Blood is not given unless the benefits outweigh the risks. AFTER THE TRANSFUSION Right after receiving a blood transfusion, you will usually feel much better and more energetic. This is especially true if your red blood cells have gotten low (anemic). The transfusion raises the level of the red blood cells which carry oxygen, and this usually causes an energy increase. The nurse administering the transfusion  will monitor you carefully for complications. HOME CARE INSTRUCTIONS  No special instructions are needed after a transfusion. You may find your energy is better. Speak with your caregiver about any limitations on activity for underlying diseases you may have. SEEK MEDICAL CARE IF:  Your condition is not improving after your transfusion. You develop redness or irritation at the intravenous (IV) site. SEEK IMMEDIATE MEDICAL CARE IF:  Any of the following symptoms occur over the next 12 hours: Shaking chills. You have a temperature by mouth above 102 F (38.9 C), not controlled by medicine. Chest, back, or muscle pain. People around you feel you are not acting correctly or are confused. Shortness of breath or difficulty breathing. Dizziness and fainting. You get a rash or develop hives. You have a decrease in urine output. Your urine turns a dark color or changes to pink, red, or brown. Any of the following symptoms occur over the next 10 days: You have a temperature by mouth above 102 F (38.9 C), not controlled by medicine. Shortness of breath. Weakness after normal activity. The white part of the eye turns yellow (jaundice). You have a decrease in the amount of urine or are urinating less often. Your urine turns a dark color or changes to pink, red, or brown. Document Released: 05/03/2000 Document Revised: 07/29/2011 Document Reviewed: 12/21/2007 [facility] Patient Information 2014 [place],  [place].  _______________________________________________________________________

## 2021-03-09 ENCOUNTER — Encounter (HOSPITAL_COMMUNITY)
Admission: RE | Admit: 2021-03-09 | Discharge: 2021-03-09 | Disposition: A | Discharge status: Home / Self Care | Payer: Medicare PPO | Source: Ambulatory Visit | Attending: Urology | Admitting: Urology

## 2021-03-09 ENCOUNTER — Other Ambulatory Visit: Payer: Self-pay

## 2021-03-09 ENCOUNTER — Encounter (HOSPITAL_COMMUNITY): Payer: Self-pay

## 2021-03-09 VITALS — BP 131/84 | HR 56 | Temp 97.9°F | Resp 16 | Ht 77.0 in | Wt 141.0 lb

## 2021-03-09 DIAGNOSIS — Z01812 Encounter for preprocedural laboratory examination: Secondary | ICD-10-CM | POA: Insufficient documentation

## 2021-03-09 DIAGNOSIS — E1140 Type 2 diabetes mellitus with diabetic neuropathy, unspecified: Secondary | ICD-10-CM | POA: Insufficient documentation

## 2021-03-09 DIAGNOSIS — Z01818 Encounter for other preprocedural examination: Secondary | ICD-10-CM

## 2021-03-09 HISTORY — DX: Depression, unspecified: F32.A

## 2021-03-09 LAB — COMPREHENSIVE METABOLIC PANEL
ALT: 13 U/L (ref 0–44)
AST: 14 U/L — ABNORMAL LOW (ref 15–41)
Albumin: 3.9 g/dL (ref 3.5–5.0)
Alkaline Phosphatase: 68 U/L (ref 38–126)
Anion gap: 5 (ref 5–15)
BUN: 31 mg/dL — ABNORMAL HIGH (ref 8–23)
CO2: 30 mmol/L (ref 22–32)
Calcium: 8.8 mg/dL — ABNORMAL LOW (ref 8.9–10.3)
Chloride: 101 mmol/L (ref 98–111)
Creatinine, Ser: 1.19 mg/dL (ref 0.61–1.24)
GFR, Estimated: >60 mL/min (ref >60)
Glucose, Bld: 167 mg/dL — ABNORMAL HIGH (ref 70–99)
Potassium: 4 mmol/L (ref 3.5–5.1)
Sodium: 136 mmol/L (ref 135–145)
Total Bilirubin: 0.9 mg/dL (ref 0.3–1.2)
Total Protein: 6.8 g/dL (ref 6.5–8.1)

## 2021-03-09 LAB — CBC
HCT: 34.4 % — ABNORMAL LOW (ref 39.0–52.0)
Hemoglobin: 11.2 g/dL — ABNORMAL LOW (ref 13.0–17.0)
MCH: 31.8 pg (ref 26.0–34.0)
MCHC: 32.6 g/dL (ref 30.0–36.0)
MCV: 97.7 fL (ref 80.0–100.0)
Platelets: 267 10*3/uL (ref 150–400)
RBC: 3.52 MIL/uL — ABNORMAL LOW (ref 4.22–5.81)
RDW: 13.1 % (ref 11.5–15.5)
WBC: 5.2 10*3/uL (ref 4.0–10.5)
nRBC: 0 % (ref 0.0–0.2)

## 2021-03-09 LAB — SURGICAL PCR SCREEN
MRSA, PCR: NEGATIVE
Staphylococcus aureus: NEGATIVE

## 2021-03-09 LAB — GLUCOSE, CAPILLARY: Glucose-Capillary: 172 mg/dL — ABNORMAL HIGH (ref 70–99)

## 2021-03-09 LAB — HEMOGLOBIN A1C
Hgb A1c MFr Bld: 7.1 % — ABNORMAL HIGH (ref 4.8–5.6)
Mean Plasma Glucose: 157.07 mg/dL

## 2021-03-09 NOTE — Progress Notes (Addendum)
Unable to get patient's medication list reconciled at PAT appointment due to there was no pharmacy tech on staff.

## 2021-03-11 LAB — URINE CULTURE: Culture: >=100000 — AB

## 2021-03-12 NOTE — Progress Notes (Addendum)
Culture results sent to Dr. Louis Meckel to review.  Also spoke to Diamond at Dr. Carlton Adam office to make sure he reviews results.

## 2021-03-13 ENCOUNTER — Other Ambulatory Visit: Payer: Self-pay | Admitting: Urology

## 2021-03-14 LAB — SARS CORONAVIRUS 2 (TAT 6-24 HRS): SARS Coronavirus 2: NEGATIVE

## 2021-03-14 NOTE — Anesthesia Preprocedure Evaluation (Addendum)
Anesthesia Evaluation  Patient identified by MRN, date of birth, ID band Patient awake    Reviewed: Allergy & Precautions, NPO status , Patient's Chart, lab work & pertinent test results  Airway Mallampati: II  TM Distance: >3 FB Neck ROM: Full    Dental  (+) Teeth Intact   Pulmonary sleep apnea ,    Pulmonary exam normal        Cardiovascular hypertension, Pt. on medications  Rhythm:Regular Rate:Normal     Neuro/Psych Depression Dementia Parkinson's disease    GI/Hepatic Neg liver ROS, GERD  Medicated,  Endo/Other  diabetes, Type 2, Oral Hypoglycemic Agents  Renal/GU CRFRenal disease  negative genitourinary   Musculoskeletal negative musculoskeletal ROS (+)   Abdominal (+)  Abdomen: soft.    Peds  Hematology  (+) anemia ,   Anesthesia Other Findings   Reproductive/Obstetrics                            Anesthesia Physical Anesthesia Plan  ASA: 3  Anesthesia Plan: General   Post-op Pain Management:    Induction: Intravenous  PONV Risk Score and Plan: 2 and Ondansetron, Dexamethasone and Treatment may vary due to age or medical condition  Airway Management Planned: Mask and Oral ETT  Additional Equipment: None  Intra-op Plan:   Post-operative Plan: Extubation in OR  Informed Consent: I have reviewed the patients History and Physical, chart, labs and discussed the procedure including the risks, benefits and alternatives for the proposed anesthesia with the patient or authorized representative who has indicated his/her understanding and acceptance.     Dental advisory given  Plan Discussed with: CRNA  Anesthesia Plan Comments: (Lab Results      Component                Value               Date                      WBC                      5.2                 03/09/2021                HGB                      11.2 (L)            03/09/2021                HCT                       34.4 (L)            03/09/2021                MCV                      97.7                03/09/2021                PLT                      267  03/09/2021           Lab Results      Component                Value               Date                      NA                       136                 03/09/2021                K                        4.0                 03/09/2021                CO2                      30                  03/09/2021                GLUCOSE                  167 (H)             03/09/2021                BUN                      31 (H)              03/09/2021                CREATININE               1.19                03/09/2021                CALCIUM                  8.8 (L)             03/09/2021                GFRNONAA                 >60                 03/09/2021          )       Anesthesia Quick Evaluation

## 2021-03-15 ENCOUNTER — Inpatient Hospital Stay (HOSPITAL_COMMUNITY): Payer: Medicare PPO | Admitting: Anesthesiology

## 2021-03-15 ENCOUNTER — Inpatient Hospital Stay (HOSPITAL_COMMUNITY)
Admission: RE | Admit: 2021-03-15 | Discharge: 2021-03-18 | DRG: 657 | Disposition: A | Discharge status: Home Health | Payer: Medicare PPO | Attending: Urology | Admitting: Urology

## 2021-03-15 ENCOUNTER — Other Ambulatory Visit: Payer: Self-pay

## 2021-03-15 ENCOUNTER — Encounter (HOSPITAL_COMMUNITY): Payer: Self-pay | Admitting: Urology

## 2021-03-15 ENCOUNTER — Encounter (HOSPITAL_COMMUNITY)
Admission: RE | Disposition: A | Discharge status: Home Health | Payer: Self-pay | Source: Home / Self Care | Attending: Urology

## 2021-03-15 DIAGNOSIS — N400 Enlarged prostate without lower urinary tract symptoms: Secondary | ICD-10-CM | POA: Diagnosis present

## 2021-03-15 DIAGNOSIS — F0280 Dementia in other diseases classified elsewhere without behavioral disturbance: Secondary | ICD-10-CM | POA: Diagnosis not present

## 2021-03-15 DIAGNOSIS — Z8249 Family history of ischemic heart disease and other diseases of the circulatory system: Secondary | ICD-10-CM | POA: Diagnosis not present

## 2021-03-15 DIAGNOSIS — N390 Urinary tract infection, site not specified: Secondary | ICD-10-CM | POA: Diagnosis not present

## 2021-03-15 DIAGNOSIS — N2889 Other specified disorders of kidney and ureter: Secondary | ICD-10-CM | POA: Diagnosis not present

## 2021-03-15 DIAGNOSIS — Z20822 Contact with and (suspected) exposure to covid-19: Secondary | ICD-10-CM | POA: Diagnosis not present

## 2021-03-15 DIAGNOSIS — L723 Sebaceous cyst: Secondary | ICD-10-CM | POA: Diagnosis not present

## 2021-03-15 DIAGNOSIS — K660 Peritoneal adhesions (postprocedural) (postinfection): Secondary | ICD-10-CM | POA: Diagnosis present

## 2021-03-15 DIAGNOSIS — G20 Parkinson's disease: Secondary | ICD-10-CM | POA: Diagnosis present

## 2021-03-15 DIAGNOSIS — K219 Gastro-esophageal reflux disease without esophagitis: Secondary | ICD-10-CM | POA: Diagnosis present

## 2021-03-15 DIAGNOSIS — E119 Type 2 diabetes mellitus without complications: Secondary | ICD-10-CM | POA: Diagnosis not present

## 2021-03-15 DIAGNOSIS — C642 Malignant neoplasm of left kidney, except renal pelvis: Secondary | ICD-10-CM | POA: Diagnosis not present

## 2021-03-15 DIAGNOSIS — Z8501 Personal history of malignant neoplasm of esophagus: Secondary | ICD-10-CM | POA: Diagnosis not present

## 2021-03-15 DIAGNOSIS — K565 Intestinal adhesions [bands], unspecified as to partial versus complete obstruction: Secondary | ICD-10-CM | POA: Diagnosis not present

## 2021-03-15 HISTORY — PX: LAPAROSCOPIC NEPHRECTOMY: SHX1930

## 2021-03-15 HISTORY — DX: Other specified disorders of kidney and ureter: N28.89

## 2021-03-15 HISTORY — PX: CYST ENTEROSTOMY: SHX6861

## 2021-03-15 LAB — TYPE AND SCREEN
ABO/RH(D): O POS
Antibody Screen: NEGATIVE

## 2021-03-15 LAB — CBC
HCT: 34.9 % — ABNORMAL LOW (ref 39.0–52.0)
Hemoglobin: 11.4 g/dL — ABNORMAL LOW (ref 13.0–17.0)
MCH: 31.9 pg (ref 26.0–34.0)
MCHC: 32.7 g/dL (ref 30.0–36.0)
MCV: 97.8 fL (ref 80.0–100.0)
Platelets: 222 10*3/uL (ref 150–400)
RBC: 3.57 MIL/uL — ABNORMAL LOW (ref 4.22–5.81)
RDW: 12.9 % (ref 11.5–15.5)
WBC: 9.5 10*3/uL (ref 4.0–10.5)
nRBC: 0 % (ref 0.0–0.2)

## 2021-03-15 LAB — BASIC METABOLIC PANEL
Anion gap: 10 (ref 5–15)
BUN: 34 mg/dL — ABNORMAL HIGH (ref 8–23)
CO2: 26 mmol/L (ref 22–32)
Calcium: 8.9 mg/dL (ref 8.9–10.3)
Chloride: 100 mmol/L (ref 98–111)
Creatinine, Ser: 1.65 mg/dL — ABNORMAL HIGH (ref 0.61–1.24)
GFR, Estimated: 44 mL/min — ABNORMAL LOW (ref >60)
Glucose, Bld: 177 mg/dL — ABNORMAL HIGH (ref 70–99)
Potassium: 3.5 mmol/L (ref 3.5–5.1)
Sodium: 136 mmol/L (ref 135–145)

## 2021-03-15 LAB — GLUCOSE, CAPILLARY
Glucose-Capillary: 98 mg/dL (ref 70–99)
Glucose-Capillary: 222 mg/dL — ABNORMAL HIGH (ref 70–99)
Glucose-Capillary: 217 mg/dL — ABNORMAL HIGH (ref 70–99)
Glucose-Capillary: 178 mg/dL — ABNORMAL HIGH (ref 70–99)
Glucose-Capillary: 95 mg/dL (ref 70–99)

## 2021-03-15 LAB — ABO/RH: ABO/RH(D): O POS

## 2021-03-15 SURGERY — NEPHRECTOMY, RADICAL, LAPAROSCOPIC, ADULT
Anesthesia: General | Site: Abdomen | Laterality: Left

## 2021-03-15 MED ORDER — LIDOCAINE 2% (20 MG/ML) 5 ML SYRINGE
INTRAMUSCULAR | Status: DC | PRN
Start: 2021-03-15 — End: 2021-03-15
  Administered 2021-03-15: 1.5 mg/kg/h via INTRAVENOUS

## 2021-03-15 MED ORDER — LIDOCAINE HCL (PF) 2 % IJ SOLN
INTRAMUSCULAR | Status: AC
  Filled 2021-03-15: qty 15

## 2021-03-15 MED ORDER — CEFAZOLIN SODIUM-DEXTROSE 2-4 GM/100ML-% IV SOLN
2.0000 g | INTRAVENOUS | Status: AC
  Administered 2021-03-15: 2 g via INTRAVENOUS
  Filled 2021-03-15: qty 100

## 2021-03-15 MED ORDER — CHLORHEXIDINE GLUCONATE 0.12 % MT SOLN
15.0000 mL | Freq: Once | OROMUCOSAL | Status: AC
  Administered 2021-03-15: 15 mL via OROMUCOSAL

## 2021-03-15 MED ORDER — KETOROLAC TROMETHAMINE 15 MG/ML IJ SOLN: 15.0000 mg | Freq: Four times a day (QID) | INTRAMUSCULAR | Status: DC | PRN

## 2021-03-15 MED ORDER — LIDOCAINE HCL (PF) 2 % IJ SOLN
INTRAMUSCULAR | Status: AC
  Filled 2021-03-15: qty 5

## 2021-03-15 MED ORDER — PROPOFOL 10 MG/ML IV BOLUS
INTRAVENOUS | Status: AC
  Filled 2021-03-15: qty 40

## 2021-03-15 MED ORDER — BUPIVACAINE-EPINEPHRINE 0.5% -1:200000 IJ SOLN
INTRAMUSCULAR | Status: DC | PRN
  Administered 2021-03-15: 30 mL

## 2021-03-15 MED ORDER — FENTANYL CITRATE (PF) 100 MCG/2ML IJ SOLN
INTRAMUSCULAR | Status: DC | PRN
  Administered 2021-03-15: 100 ug via INTRAVENOUS

## 2021-03-15 MED ORDER — ROCURONIUM BROMIDE 10 MG/ML (PF) SYRINGE
PREFILLED_SYRINGE | INTRAVENOUS | Status: AC
  Filled 2021-03-15: qty 10

## 2021-03-15 MED ORDER — ROCURONIUM BROMIDE 100 MG/10ML IV SOLN
INTRAVENOUS | Status: DC | PRN
  Administered 2021-03-15 (×4): 20 mg via INTRAVENOUS
  Administered 2021-03-15: 30 mg via INTRAVENOUS
  Administered 2021-03-15: 60 mg via INTRAVENOUS
  Administered 2021-03-15: 20 mg via INTRAVENOUS

## 2021-03-15 MED ORDER — ACETAMINOPHEN 10 MG/ML IV SOLN
INTRAVENOUS | Status: AC
  Filled 2021-03-15: qty 100

## 2021-03-15 MED ORDER — TRAMADOL HCL 50 MG PO TABS
50.0000 mg | ORAL_TABLET | Freq: Four times a day (QID) | ORAL | Status: DC | PRN
  Administered 2021-03-16: 50 mg via ORAL
  Filled 2021-03-15: qty 1

## 2021-03-15 MED ORDER — INSULIN ASPART 100 UNIT/ML IJ SOLN
INTRAMUSCULAR | Status: AC
  Filled 2021-03-15: qty 1

## 2021-03-15 MED ORDER — GLYCOPYRROLATE 0.2 MG/ML IJ SOLN
INTRAMUSCULAR | Status: AC
  Filled 2021-03-15: qty 1

## 2021-03-15 MED ORDER — PROPOFOL 500 MG/50ML IV EMUL
INTRAVENOUS | Status: DC | PRN
  Administered 2021-03-15: 25 ug/kg/min via INTRAVENOUS

## 2021-03-15 MED ORDER — SODIUM CHLORIDE 0.9 % IV SOLN: INTRAVENOUS | Status: DC

## 2021-03-15 MED ORDER — FENTANYL CITRATE (PF) 250 MCG/5ML IJ SOLN
INTRAMUSCULAR | Status: AC
  Filled 2021-03-15: qty 5

## 2021-03-15 MED ORDER — PHENYLEPHRINE HCL-NACL 20-0.9 MG/250ML-% IV SOLN: INTRAVENOUS | Status: DC | PRN

## 2021-03-15 MED ORDER — SODIUM CHLORIDE 0.9 % IV SOLN: INTRAVENOUS | Status: DC | PRN

## 2021-03-15 MED ORDER — DEXAMETHASONE SODIUM PHOSPHATE 10 MG/ML IJ SOLN
INTRAMUSCULAR | Status: DC | PRN
  Administered 2021-03-15: 8 mg via INTRAVENOUS

## 2021-03-15 MED ORDER — ACETAMINOPHEN 10 MG/ML IV SOLN: 1000.0000 mg | Freq: Once | INTRAVENOUS | Status: DC | PRN

## 2021-03-15 MED ORDER — SODIUM CHLORIDE (PF) 0.9 % IJ SOLN
INTRAMUSCULAR | Status: AC
  Filled 2021-03-15: qty 20

## 2021-03-15 MED ORDER — SODIUM CHLORIDE 0.9 % IR SOLN
Status: DC | PRN
  Administered 2021-03-15: 1000 mL

## 2021-03-15 MED ORDER — SODIUM CHLORIDE (PF) 0.9 % IJ SOLN
INTRAMUSCULAR | Status: DC | PRN
  Administered 2021-03-15: 70 mL

## 2021-03-15 MED ORDER — ONDANSETRON HCL 4 MG/2ML IJ SOLN
INTRAMUSCULAR | Status: AC
  Filled 2021-03-15: qty 2

## 2021-03-15 MED ORDER — SUGAMMADEX SODIUM 200 MG/2ML IV SOLN
INTRAVENOUS | Status: DC | PRN
  Administered 2021-03-15: 200 mg via INTRAVENOUS

## 2021-03-15 MED ORDER — BOOST / RESOURCE BREEZE PO LIQD CUSTOM
1.0000 | Freq: Three times a day (TID) | ORAL | Status: DC
  Administered 2021-03-15 – 2021-03-18 (×5): 1 via ORAL

## 2021-03-15 MED ORDER — PROPOFOL 10 MG/ML IV BOLUS
INTRAVENOUS | Status: DC | PRN
  Administered 2021-03-15: 90 mg via INTRAVENOUS

## 2021-03-15 MED ORDER — TRAMADOL HCL 50 MG PO TABS: 50.0000 mg | ORAL_TABLET | Freq: Four times a day (QID) | ORAL | 0 refills | Status: DC | PRN

## 2021-03-15 MED ORDER — LACTATED RINGERS IV SOLN: INTRAVENOUS | Status: DC | PRN

## 2021-03-15 MED ORDER — PROPOFOL 500 MG/50ML IV EMUL
INTRAVENOUS | Status: AC
  Filled 2021-03-15: qty 50

## 2021-03-15 MED ORDER — ACETAMINOPHEN 10 MG/ML IV SOLN
INTRAVENOUS | Status: DC | PRN
  Administered 2021-03-15: 1000 mg via INTRAVENOUS

## 2021-03-15 MED ORDER — BUPIVACAINE LIPOSOME 1.3 % IJ SUSP
INTRAMUSCULAR | Status: AC
  Filled 2021-03-15: qty 20

## 2021-03-15 MED ORDER — BUPIVACAINE-EPINEPHRINE (PF) 0.5% -1:200000 IJ SOLN
INTRAMUSCULAR | Status: AC
  Filled 2021-03-15: qty 30

## 2021-03-15 MED ORDER — KETAMINE HCL 10 MG/ML IJ SOLN
INTRAMUSCULAR | Status: DC | PRN
  Administered 2021-03-15 (×4): 10 mg via INTRAVENOUS

## 2021-03-15 MED ORDER — 0.9 % SODIUM CHLORIDE (POUR BTL) OPTIME
TOPICAL | Status: DC | PRN
  Administered 2021-03-15: 1000 mL

## 2021-03-15 MED ORDER — ACETAMINOPHEN 10 MG/ML IV SOLN
1000.0000 mg | Freq: Four times a day (QID) | INTRAVENOUS | Status: AC
  Administered 2021-03-15 – 2021-03-16 (×4): 1000 mg via INTRAVENOUS
  Filled 2021-03-15 (×4): qty 100

## 2021-03-15 MED ORDER — ONDANSETRON HCL 4 MG/2ML IJ SOLN
INTRAMUSCULAR | Status: DC | PRN
  Administered 2021-03-15: 4 mg via INTRAVENOUS

## 2021-03-15 MED ORDER — ORAL CARE MOUTH RINSE: 15.0000 mL | Freq: Once | OROMUCOSAL | Status: AC

## 2021-03-15 MED ORDER — PHENYLEPHRINE HCL (PRESSORS) 10 MG/ML IV SOLN
INTRAVENOUS | Status: AC
  Filled 2021-03-15: qty 2

## 2021-03-15 MED ORDER — LIDOCAINE 2% (20 MG/ML) 5 ML SYRINGE
INTRAMUSCULAR | Status: DC | PRN
  Administered 2021-03-15: 40 mg via INTRAVENOUS

## 2021-03-15 MED ORDER — PHENYLEPHRINE 40 MCG/ML (10ML) SYRINGE FOR IV PUSH (FOR BLOOD PRESSURE SUPPORT)
PREFILLED_SYRINGE | INTRAVENOUS | Status: AC
  Filled 2021-03-15: qty 10

## 2021-03-15 MED ORDER — FENTANYL CITRATE PF 50 MCG/ML IJ SOSY: 25.0000 ug | PREFILLED_SYRINGE | INTRAMUSCULAR | Status: DC | PRN

## 2021-03-15 MED ORDER — DEXAMETHASONE SODIUM PHOSPHATE 10 MG/ML IJ SOLN
INTRAMUSCULAR | Status: AC
  Filled 2021-03-15: qty 1

## 2021-03-15 MED ORDER — EPHEDRINE SULFATE-NACL 50-0.9 MG/10ML-% IV SOSY
PREFILLED_SYRINGE | INTRAVENOUS | Status: DC | PRN
  Administered 2021-03-15: 10 mg via INTRAVENOUS
  Administered 2021-03-15: 5 mg via INTRAVENOUS
  Administered 2021-03-15: 10 mg via INTRAVENOUS

## 2021-03-15 MED ORDER — PHENYLEPHRINE 40 MCG/ML (10ML) SYRINGE FOR IV PUSH (FOR BLOOD PRESSURE SUPPORT)
PREFILLED_SYRINGE | INTRAVENOUS | Status: DC | PRN
  Administered 2021-03-15 (×3): 80 ug via INTRAVENOUS

## 2021-03-15 MED ORDER — PHENYLEPHRINE HCL-NACL 20-0.9 MG/250ML-% IV SOLN
INTRAVENOUS | Status: DC | PRN
  Administered 2021-03-15: 20 ug/min via INTRAVENOUS

## 2021-03-15 MED ORDER — EPHEDRINE 5 MG/ML INJ
INTRAVENOUS | Status: AC
  Filled 2021-03-15: qty 5

## 2021-03-15 MED ORDER — INSULIN ASPART 100 UNIT/ML IJ SOLN
8.0000 [IU] | Freq: Once | INTRAMUSCULAR | Status: AC
  Administered 2021-03-15: 8 [IU] via SUBCUTANEOUS

## 2021-03-15 MED ORDER — INSULIN ASPART 100 UNIT/ML IJ SOLN
6.0000 [IU] | Freq: Once | INTRAMUSCULAR | Status: AC
  Administered 2021-03-15: 6 [IU] via SUBCUTANEOUS

## 2021-03-15 MED ORDER — GLYCOPYRROLATE 0.2 MG/ML IJ SOLN
INTRAMUSCULAR | Status: DC | PRN
  Administered 2021-03-15: .1 mg via INTRAVENOUS

## 2021-03-15 MED ORDER — KETAMINE HCL 10 MG/ML IJ SOLN
INTRAMUSCULAR | Status: AC
  Filled 2021-03-15: qty 1

## 2021-03-15 MED ORDER — LACTATED RINGERS IV SOLN: INTRAVENOUS | Status: DC

## 2021-03-15 SURGICAL SUPPLY — 73 items
ADH SKN CLS APL DERMABOND .7 (GAUZE/BANDAGES/DRESSINGS) ×1
APL ESCP 34 STRL LF DISP (HEMOSTASIS)
APL PRP STRL LF DISP 70% ISPRP (MISCELLANEOUS) ×1
APL SRG 38 LTWT LNG FL B (MISCELLANEOUS)
APPLICATOR ARISTA FLEXITIP XL (MISCELLANEOUS) IMPLANT
APPLICATOR SURGIFLO ENDO (HEMOSTASIS) IMPLANT
APPLIER CLIP ROT 10 11.4 M/L (STAPLE)
APR CLP MED LRG 11.4X10 (STAPLE)
BAG COUNTER SPONGE SURGICOUNT (BAG) ×2 IMPLANT
BAG LAPAROSCOPIC 12 15 PORT 16 (BASKET) IMPLANT
BAG RETRIEVAL 12/15 (BASKET) ×2
BAG SPEC THK2 15X12 ZIP CLS (MISCELLANEOUS) ×1
BAG SPNG CNTER NS LX DISP (BAG) ×1
BAG ZIPLOCK 12X15 (MISCELLANEOUS) ×2 IMPLANT
BLADE EXTENDED COATED 6.5IN (ELECTRODE) IMPLANT
BLADE SURG 15 STRL LF DISP TIS (BLADE) IMPLANT
BLADE SURG 15 STRL SS (BLADE) ×2
BLADE SURG SZ10 CARB STEEL (BLADE) IMPLANT
CHLORAPREP W/TINT 26 (MISCELLANEOUS) ×2 IMPLANT
CLIP APPLIE ROT 10 11.4 M/L (STAPLE) IMPLANT
CLIP LIGATING HEMO LOK XL GOLD (MISCELLANEOUS) ×2 IMPLANT
CLIP LIGATING HEMO O LOK GREEN (MISCELLANEOUS) ×4 IMPLANT
CUTTER FLEX LINEAR 45M (STAPLE) ×1 IMPLANT
DECANTER SPIKE VIAL GLASS SM (MISCELLANEOUS) ×2 IMPLANT
DERMABOND ADVANCED (GAUZE/BANDAGES/DRESSINGS) ×1
DERMABOND ADVANCED .7 DNX12 (GAUZE/BANDAGES/DRESSINGS) ×1 IMPLANT
DRAPE INCISE IOBAN 66X45 STRL (DRAPES) ×2 IMPLANT
DRAPE WARM FLUID 44X44 (DRAPES) IMPLANT
ELECT PENCIL ROCKER SW 15FT (MISCELLANEOUS) ×2 IMPLANT
ELECT REM PT RETURN 15FT ADLT (MISCELLANEOUS) ×2 IMPLANT
GAUZE PACKING IODOFORM 1/4X15 (PACKING) ×1 IMPLANT
GAUZE SPONGE 2X2 8PLY STRL LF (GAUZE/BANDAGES/DRESSINGS) IMPLANT
GLOVE SURG ENC TEXT LTX SZ7.5 (GLOVE) ×2 IMPLANT
GOWN STRL REUS W/TWL LRG LVL3 (GOWN DISPOSABLE) ×4 IMPLANT
HEMOSTAT ARISTA ABSORB 3G PWDR (HEMOSTASIS) IMPLANT
HEMOSTAT SURGICEL 4X8 (HEMOSTASIS) ×1 IMPLANT
IRRIG SUCT STRYKERFLOW 2 WTIP (MISCELLANEOUS) ×2
IRRIGATION SUCT STRKRFLW 2 WTP (MISCELLANEOUS) ×1 IMPLANT
KIT BASIN OR (CUSTOM PROCEDURE TRAY) ×2 IMPLANT
KIT TURNOVER KIT A (KITS) IMPLANT
MANIFOLD NEPTUNE II (INSTRUMENTS) ×2 IMPLANT
MARKER SKIN DUAL TIP RULER LAB (MISCELLANEOUS) ×2 IMPLANT
NDL INSUFFLATION 14GA 120MM (NEEDLE) IMPLANT
NDL SPNL 22GX3.5 QUINCKE BK (NEEDLE) IMPLANT
NEEDLE INSUFFLATION 14GA 120MM (NEEDLE) IMPLANT
NEEDLE SPNL 22GX3.5 QUINCKE BK (NEEDLE) ×2 IMPLANT
PAD POSITIONING PINK XL (MISCELLANEOUS) ×2 IMPLANT
PROTECTOR NERVE ULNAR (MISCELLANEOUS) ×4 IMPLANT
RELOAD 45 VASCULAR/THIN (ENDOMECHANICALS) ×2 IMPLANT
RELOAD STAPLE 45 2.5 WHT GRN (ENDOMECHANICALS) IMPLANT
RELOAD STAPLE 45 3.5 BLU ETS (ENDOMECHANICALS) IMPLANT
RELOAD STAPLE TA45 3.5 REG BLU (ENDOMECHANICALS) IMPLANT
SCISSORS LAP 5X35 DISP (ENDOMECHANICALS) ×1 IMPLANT
SET TUBE SMOKE EVAC HIGH FLOW (TUBING) ×2 IMPLANT
SHEARS HARMONIC ACE PLUS 36CM (ENDOMECHANICALS) ×2 IMPLANT
SLEEVE XCEL OPT CAN 5 100 (ENDOMECHANICALS) ×2 IMPLANT
SPONGE GAUZE 2X2 STER 10/PKG (GAUZE/BANDAGES/DRESSINGS) ×1
SPONGE T-LAP 4X18 ~~LOC~~+RFID (SPONGE) IMPLANT
SUT MNCRL AB 4-0 PS2 18 (SUTURE) ×4 IMPLANT
SUT PDS AB 0 CT1 36 (SUTURE) ×2 IMPLANT
SUT VIC AB 0 CT1 27 (SUTURE) ×2
SUT VIC AB 0 CT1 27XBRD ANTBC (SUTURE) IMPLANT
SUT VIC AB 2-0 CT1 27 (SUTURE)
SUT VIC AB 2-0 CT1 27XBRD (SUTURE) IMPLANT
SUT VICRYL 0 UR6 27IN ABS (SUTURE) ×2 IMPLANT
TAPE CLOTH 4X10 WHT NS (GAUZE/BANDAGES/DRESSINGS) IMPLANT
TOWEL OR 17X26 10 PK STRL BLUE (TOWEL DISPOSABLE) ×2 IMPLANT
TOWEL OR NON WOVEN STRL DISP B (DISPOSABLE) ×2 IMPLANT
TRAY FOLEY MTR SLVR 16FR STAT (SET/KITS/TRAYS/PACK) ×2 IMPLANT
TRAY LAPAROSCOPIC (CUSTOM PROCEDURE TRAY) ×2 IMPLANT
TROCAR BLADELESS OPT 5 100 (ENDOMECHANICALS) ×2 IMPLANT
TROCAR UNIVERSAL OPT 12M 100M (ENDOMECHANICALS) ×3 IMPLANT
TROCAR XCEL 12X100 BLDLESS (ENDOMECHANICALS) ×2 IMPLANT

## 2021-03-15 NOTE — Anesthesia Postprocedure Evaluation (Signed)
Anesthesia Post Note  Patient: [name]  Procedure(s) Performed: LEFT LAPAROSCOPIC RADICAL NEPHRECTOMY WITH LYSIS OF ADHESIONS (Left: Abdomen) excision of sebaceous cyst of left lower abdomen (Left: Abdomen)     Patient location during evaluation: PACU Anesthesia Type: General Level of consciousness: awake and alert Pain management: pain level controlled Vital Signs Assessment: post-procedure vital signs reviewed and stable Respiratory status: spontaneous breathing, nonlabored ventilation, respiratory function stable and patient connected to nasal cannula oxygen Cardiovascular status: blood pressure returned to baseline and stable Postop Assessment: no apparent nausea or vomiting Anesthetic complications: no   No notable events documented.  Last Vitals:  Vitals:   03/15/21 1400 03/15/21 1518  BP: (!) 141/85 123/76  Pulse: 66 70  Resp: 10 13  Temp: (!) 36.3 C (!) 35.7 C  SpO2: 100% 100%    Last Pain:  Vitals:   03/15/21 1400  TempSrc:   PainSc: 0-No pain                 Belenda Cruise P Edvin Albus

## 2021-03-15 NOTE — Plan of Care (Signed)

## 2021-03-15 NOTE — Interval H&P Note (Signed)
History and Physical Interval Note:  03/15/2021 7:19 AM  [name]  has presented today for surgery, with the diagnosis of LEFT RENAL MASS.  The various methods of treatment have been discussed with the patient and family. After consideration of risks, benefits and other options for treatment, the patient has consented to  Procedure(s): LEFT LAPAROSCOPIC RADICAL NEPHRECTOMY (Left) as a surgical intervention.  The patient's history has been reviewed, patient examined, no change in status, stable for surgery.  I have reviewed the patient's chart and labs.  Questions were answered to the patient's satisfaction.     Ardis Hughs

## 2021-03-15 NOTE — Anesthesia Procedure Notes (Signed)
Procedure Name: Intubation Date/Time: 03/15/2021 7:34 AM Performed by: [name], CRNA Pre-anesthesia Checklist: Patient identified, Emergency Drugs available, Suction available and Patient being monitored Patient Re-evaluated:Patient Re-evaluated prior to induction Oxygen Delivery Method: Circle system utilized Preoxygenation: Pre-oxygenation with 100% oxygen Induction Type: IV induction Ventilation: Mask ventilation without difficulty Laryngoscope Size: Miller and 3 Grade View: Grade I Tube type: Oral Tube size: 8.0 mm Number of attempts: 1 Airway Equipment and Method: Patient positioned with wedge pillow and Stylet Placement Confirmation: ETT inserted through vocal cords under direct vision, positive ETCO2 and breath sounds checked- equal and bilateral Secured at: 24 cm Tube secured with: Tape Dental Injury: Teeth and Oropharynx as per pre-operative assessment

## 2021-03-15 NOTE — Transfer of Care (Signed)
Immediate Anesthesia Transfer of Care Note  Patient: Arzell Mcgeehan Sitter  Procedure(s) Performed: LEFT LAPAROSCOPIC RADICAL NEPHRECTOMY WITH LYSIS OF ADHESIONS (Left: Abdomen) excision of sebaceous cyst of left lower abdomen (Left: Abdomen)  Patient Location: PACU  Anesthesia Type:General  Level of Consciousness: drowsy  Airway & Oxygen Therapy: Patient Spontanous Breathing and Patient connected to face mask oxygen  Post-op Assessment: Report given to RN and Post -op Vital signs reviewed and stable  Post vital signs: Reviewed and stable  Last Vitals:  Vitals Value Taken Time  BP 137/87 03/15/21 1233  Temp    Pulse 79 03/15/21 1242  Resp 24 03/15/21 1242  SpO2 100 % 03/15/21 1242  Vitals shown include unvalidated device data.  Last Pain:  Vitals:   03/15/21 0536  TempSrc:   PainSc: 0-No pain         Complications: No notable events documented.

## 2021-03-15 NOTE — Progress Notes (Signed)
Wife at bedside.

## 2021-03-15 NOTE — Op Note (Signed)
Preoperative diagnosis:  Left renal mass  Abdominal wall sebaceous cyst   Postoperative diagnosis:  same   Procedure: Laparoscopic left radical nephrectomy Sebaceous cyst excision Extensive lysis of adhesions - 15min  Surgeon: Ardis Hughs, MD Resident Assistant: Davis Gourd, MD  Anesthesia: General  Complications: None  Intraoperative findings:  #1:  The patient had a large sebaceous cyst, measuring approximately 3 cm, which was directly in our operative field.  This was completely excised prior to initiating the rest of the case. #2: The patient had extensive small bowel and colon adherent to the anterior abdominal wall.  Prior to placing our left most port extensive lysis of adhesions was required to get off the abdominal wall and make room for the port. #3: There was a large cyst in the lower pole of the left kidney.  Once the kidney was freed from the hilum in order to expedite removing it from the sidewall we drained the cyst which helped facilitate removal of the kidney quite significantly.   EBL: 50 cc  Specimens:  Left kidney and proximal ureter  Indication: [name] is a 70 y.o. patient with large left renal mass.  After reviewing the management options for treatment, [name] elected to proceed with the above surgical procedure(s). We have discussed the potential benefits and risks of the procedure, side effects of the proposed treatment, the likelihood of the patient achieving the goals of the procedure, and any potential problems that might occur during the procedure or recuperation. Informed consent has been obtained.  Description of procedure:  With my 2 fingers I expressed copious amt of sebum from the sebaceous cyst, I then used a 15 blade and Adson's to ensure that the capsule of the cyst was completely excised.  There was a fairly large cavity within the left upper quadrant region of the skin which I then packed.  We then subsequently sterilized the  surrounding area, and prepped and draped the patient in the routine sterile fashion.  Patient was placed in the lateral decubitus position left side up.  A site was selected lateral to the umbilicus for placement of the camera port. This was placed using a standard open Hassan technique which allowed entry into the peritoneal cavity under direct vision and without difficulty. A 12 mm Hassan cannula was placed and a pneumoperitoneum established. The camera was then used to inspect the abdomen and there was no evidence of any intra-abdominal injuries or other abnormalities.  At this time it was clear that there were lots of and anterior abdominal wall adhesions that would need to be freed and cleared prior to placing the remainder of the ports.  We were able to get in the ports on the left lower quadrant.  Using these 2 ports I was able to slowly excise adhesions off the anterior abdominal wall.  I then ran the bowel to ensure that there were no enterotomies or areas of concern.  Given the extent of the adhesions, [name] required at least 45 minutes of dissection to free the adhesions off the abdominal wall so that I can move forward.  The remaining abdominal port was then placed.  Ultimately, one 5 mm trocar was placed subcostal margin in the left upper quadrant, the second 5 mm trocar was placed laterally to the camera port so as to triangulate the kidney. An assistant port was then placed in between the camera and the left lateral port. The assistant port was a 12 mm port.   The white  line of Toldt was incised allowing the colon to be mobilized medially and the plane between the mesocolon and the anterior layer of Gerota's fascia to be developed and the kidney exposed. The ureter and gonadal vein were identified inferiorly and the ureter was lifted anteriorly off the psoas muscle. The gonadal vein was then dissected out inferior to the lower pole and 2 clips were placed both superiorly and inferiorly and then  ligated. A second 5 mm port was placed in the left lower quadrant to help facilitate lifting up of the kidney. Dissection proceeded superiorly along the gonadal vein until the renal vein was identified. The renal hilum was then carefully isolated with a combination of blunt and sharp dissectiong allowing the renal arterial and venous structures to be separated and isolated.   The renal artery was isolated and ligated with a 45 mm Flex ETS stapler. The renal vein was then isolated and also ligated and divided with a 45 mm Flex ETS stapler.   Gerota's fascia was intentionally entered superiorly and the space between the adrenal gland and the kidney was developed allowing the adrenal gland to be spared. A third staple load was then used which divided the adrenal gland. once the hilum had been ligated dissection ensued from the inferior pole of the kidney. The ureter was transected placing 2 clips on the stay side and one on the specimen side. The lateral attachments of the kidney were then freed. Our attention was then turned to the upper pole which was dissected off of the spleen and the splenic attachments. The pancreas and colon were noted to be well away from the structures. The remaining of the posterior aspect of the attachments was then ligated using a Harmonic Scalpel. Once the kidney was freed from its attachments it was shown to medially and the vascular hilum was inspected and noted to be sufficiently hemostatic. There was slight bit of oozing from the adrenal gland which was cauterized and then Surgicel placed over top. Insufflation was then turned down to 8 mm mercury and the kidney bed was noted to be hemostatic.   80cc of Exparel was then injected into the left anterior axillary line b/w the iliac crest and the twelfth rib under laparoscopic guidance. The layer between the tranversus abdominus and the internal oblique was targeted.   The kidney/ureter specimen was then placed into a 12 mm  Endocatch II retrieval bag, this was passed through the port site of the assistant port and left lower quadrant. The trochars were then removed under visual guidance to ensure no ongoing port site bleeding was occurring. The extraction incision was extended from the 12 mm left lower quadrant port. The external oblique and internal oblique muscles were spread as best as possible with as little muscle fibers ligated as possible in order to safely extracted the specimen. The internal oblique flash of was then closed with 2-0 Vicryl in an interrupted figure-of-eight fashion. The external oblique fascia was closed with a 0 looped PDS. The camera port was then closed with 2-0 Vicryl the level of the fascia. All incisions were injected with Exparel and reapproximated at the skin with 4-0 monocryl sutures. Dermabond was applied to the skin. The patient tolerated the procedure well and without complications and was transferred to the recovery unit in satisfactory condition.

## 2021-03-15 NOTE — H&P (Signed)
70 year old male with a history normal pressure hydrocephalus, Parkinson's disease, and a remote history T1 esophageal cancer presents today for further discussion and management his left renal mass. The patient underwent a CT scan because of aggressive weight loss. He has lost 100 lb over the last year. However, he has regained 20 lb over the last several months. The CT scan shows a 7.5 x 5.3 cm mass in the left upper pole. There is also a suspicious lesion measuring 12 mm on the medial aspect of the right upper pole.   The patient suffers from dementia, related to his Parkinson's syndrome. Claims that he lost weight when he stopped eating, not hungry. He subsequently has started eating quite for ratio sleep. He is now 20 lb heavier than he was several months ago.   The patient recently had a shunt for his normal pressure hydrocephalus. Initially this was quite successful. However, he has started to have worsening ataxia. He is also severely incontinent. They are scheduled to see the neurosurgeon for adjustment of the shunt.     ALLERGIES: Fentanyl Hcl - Other Reaction, Hallucinations    MEDICATIONS: Metformin Hcl 500 mg tablet  Nexium 40 mg capsule,delayed release  Centrum Silver  Losartan Potassium     GU PSH: None     PSH Notes: Esophagectomy   Intracranial shunt      NON-GU PSH: Cataract surgery Foot surgery (unspecified)     GU PMH: Left uncertain neoplasm of kidney, Large enhancing left upper pole mass. Would doubt this is related to his prior esophageal cancer given that was T1 and he's had no reccurence. I drew patient a picture of the anatomy. We discussed the nature of renal masses-benign versus malignant. We discussed the mass has a higher likelihood of being malignant due to the enhancement on the contrasted studies. We discussed the nature risks benefits and alternatives to surveillance, biopsy, ablation, partial nephrectomy and radical nephrectomy. All questions answered.  The mass is too big for ablation and he will likely need a left radical nephrectomy. I will get a Chest CT and refer him to one of the robotic/lap surgeons. Disc risk of ESRD and HD in the future. - 01/24/7892 Right uncertain neoplasm of kidney, These lesions are more hyerpdense and could be cystic. In the big picture they are smaller than 2 cm and could be monitored. One other thought would be to better characterize the right lesions, because he could get a staged right partial and then get the left nephrectomy, so that he wouldn't be getting a partial Nx on a solitary kidney. In summary, I will set up for an abd MRI. - 01/19/2021 BPH w/LUTS, Benign localized prostatic hyperplasia with lower urinary tract symptoms (LUTS) - 2016 ED due to arterial insufficiency, Erectile dysfunction due to arterial insufficiency - 2016 Nocturia, Nocturia - 2016 Primary hypogonadism, Hypogonadism, testicular - 2016 BPH w/o LUTS, Benign prostatic hypertrophy without lower urinary tract symptoms - 2014 Scrotal abscess/inflammation, Cellulitis of scrotum - 2014      PMH Notes:  2006-05-15 14:10:44 - Note: Arthritis  2006-05-15 14:10:44 - Note: Esophageal Cancer   NON-GU PMH: Encounter for general adult medical examination without abnormal findings, Encounter for preventive health examination - 2016 Arthritis Diabetes Type 2 GERD Hypertension Malignant neoplasm of esophagus, unspecified Sleep Apnea    FAMILY HISTORY: Congestive Heart Failure - Father Death of family member - Mother, Father Hypertension - Mother   SOCIAL HISTORY: Marital Status: Married Preferred Language: English; Ethnicity: Not Hispanic Or Latino Current  Smoking Status: Patient has never smoked.   Tobacco Use Assessment Completed: Used Tobacco in last 30 days? Has never drank.  Does not drink caffeine. 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     Notes: Never smoker, Tobacco Use, Marital History - Currently Married, Alcohol Use,  Death In The Family Mother, Death In The Family Father   REVIEW OF SYSTEMS:    GU Review Male:   Patient denies erection problems, burning/ pain with urination, frequent urination, stream starts and stops, leakage of urine, have to strain to urinate , get up at night to urinate, penile pain, hard to postpone urination, and trouble starting your stream.  Gastrointestinal (Upper):   Patient denies nausea, vomiting, and indigestion/ heartburn.  Gastrointestinal (Lower):   Patient denies diarrhea and constipation.  Constitutional:   Patient denies fever, night sweats, weight loss, and fatigue.  Skin:   Patient denies skin rash/ lesion and itching.  Eyes:   Patient denies blurred vision and double vision.  Ears/ Nose/ Throat:   Patient denies sore throat and sinus problems.  Hematologic/Lymphatic:   Patient denies swollen glands and easy bruising.  Cardiovascular:   Patient denies leg swelling and chest pains.  Respiratory:   Patient denies cough and shortness of breath.  Endocrine:   Patient denies excessive thirst.  Musculoskeletal:   Patient denies back pain and joint pain.  Neurological:   Patient denies headaches and dizziness.  Psychologic:   Patient denies depression and anxiety.   VITAL SIGNS: None   Complexity of Data:  Source Of History:  Patient  Records Review:   Previous Doctor Records, Previous Hospital Records, Previous Patient Records, POC Tool  Urine Test Review:   Urinalysis   05/11/15 05/06/14  PSA  Total PSA 0.40  0.41     05/11/15 05/06/14 05/17/13 05/18/12 05/20/11 05/07/07 05/02/06  Hormones  Testosterone, Total 288  231  301  392.02  258.18  2.06  2.14     PROCEDURES: None   ASSESSMENT:      ICD-10 Details  1 GU:   Left uncertain neoplasm of kidney - Y65.03   2   Right uncertain neoplasm of kidney - D41.01      PLAN:           Document Letter(s):  Created for Patient: Clinical Summary    I detailed the various treatment options with the patient and  ultimately I recommended a laparoscopic radical nephrectomy. I went over this operation in detail with the patient including the risks and benefits. We discussed port position and I outlined the position of the 4 planned trocars. I also explained to them that they will need extraction incision which typically is in the lower quadrant, connecting to the two lower lateral incisions. I discussed the actual surgery with them and we went over the various structures that are in intimate association with the kidney and the risk of damage thereof. I outlined the risk of injury to the major nerves and vessels in proximity to the kidney. I explained the patient the expected hospital course. I told them that they should plan to be in the hospital at least 2-3 days. Further, I told them that they would likely need approximately 1 month to fully recover. Given the severity of this planned operation, we will have the patient be seen by their primary care doctor and cleared for surgery. We will plan to schedule this as soon as possible.         Notes:   I went  through the patient's CT scan with him in detail. I think he needs a left nephrectomy, I related to them my concern that that he has renal cell carcinoma. It may be metastatic, as he does have a metachronous lesion on the right side, albeit quite small. There is also something in the wall of the abdomen, but this appears to be more related to infection and his past history of abscess. Patient has several significant comorbidities including in pH and Parkinson's syndrome along with weight loss. We will see if there is any medical optimization that we can do prior to his surgery, I also encouraged the patient to follow up the neurosurgeon to see if his shunt may be adjusted prior to the surgery. Try to get this scheduled as quickly as possible.

## 2021-03-16 ENCOUNTER — Encounter (HOSPITAL_COMMUNITY): Payer: Self-pay | Admitting: Urology

## 2021-03-16 LAB — CBC
HCT: 30.4 % — ABNORMAL LOW (ref 39.0–52.0)
Hemoglobin: 9.9 g/dL — ABNORMAL LOW (ref 13.0–17.0)
MCH: 31.5 pg (ref 26.0–34.0)
MCHC: 32.6 g/dL (ref 30.0–36.0)
MCV: 96.8 fL (ref 80.0–100.0)
Platelets: 191 10*3/uL (ref 150–400)
RBC: 3.14 MIL/uL — ABNORMAL LOW (ref 4.22–5.81)
RDW: 13.1 % (ref 11.5–15.5)
WBC: 8.7 10*3/uL (ref 4.0–10.5)
nRBC: 0 % (ref 0.0–0.2)

## 2021-03-16 LAB — BASIC METABOLIC PANEL
Anion gap: 6 (ref 5–15)
BUN: 35 mg/dL — ABNORMAL HIGH (ref 8–23)
CO2: 28 mmol/L (ref 22–32)
Calcium: 8.6 mg/dL — ABNORMAL LOW (ref 8.9–10.3)
Chloride: 101 mmol/L (ref 98–111)
Creatinine, Ser: 1.87 mg/dL — ABNORMAL HIGH (ref 0.61–1.24)
GFR, Estimated: 38 mL/min — ABNORMAL LOW (ref >60)
Glucose, Bld: 97 mg/dL (ref 70–99)
Potassium: 4.1 mmol/L (ref 3.5–5.1)
Sodium: 135 mmol/L (ref 135–145)

## 2021-03-16 LAB — GLUCOSE, CAPILLARY
Glucose-Capillary: 89 mg/dL (ref 70–99)
Glucose-Capillary: 143 mg/dL — ABNORMAL HIGH (ref 70–99)
Glucose-Capillary: 227 mg/dL — ABNORMAL HIGH (ref 70–99)
Glucose-Capillary: 210 mg/dL — ABNORMAL HIGH (ref 70–99)

## 2021-03-16 MED ORDER — SODIUM CHLORIDE 0.9 % IV SOLN
1.0000 g | Freq: Three times a day (TID) | INTRAVENOUS | Status: DC
  Administered 2021-03-16 – 2021-03-18 (×6): 1 g via INTRAVENOUS
  Filled 2021-03-16 (×4): qty 1000
  Filled 2021-03-16: qty 1
  Filled 2021-03-16: qty 1000
  Filled 2021-03-16: qty 1
  Filled 2021-03-16: qty 1000

## 2021-03-16 MED ORDER — ACETAMINOPHEN 10 MG/ML IV SOLN
1000.0000 mg | Freq: Four times a day (QID) | INTRAVENOUS | Status: AC
  Administered 2021-03-16 – 2021-03-17 (×4): 1000 mg via INTRAVENOUS
  Filled 2021-03-16 (×4): qty 100

## 2021-03-16 MED ORDER — STERILE WATER FOR INJECTION IJ SOLN: 20.0000 mg/kg | Freq: Three times a day (TID) | INTRAMUSCULAR | Status: DC

## 2021-03-16 MED ORDER — CHLORHEXIDINE GLUCONATE CLOTH 2 % EX PADS
6.0000 | MEDICATED_PAD | Freq: Every day | CUTANEOUS | Status: DC
  Administered 2021-03-16 – 2021-03-18 (×3): 6 via TOPICAL

## 2021-03-16 MED ORDER — ENSURE ENLIVE PO LIQD
237.0000 mL | Freq: Two times a day (BID) | ORAL | Status: DC
  Administered 2021-03-16 – 2021-03-18 (×4): 237 mL via ORAL

## 2021-03-16 NOTE — TOC Initial Note (Signed)
Transition of Care [facility]) - Initial/Assessment Note    Patient Details  Name: [name] MRN: [id number] Date of Birth: 05-04-1951  Transition of [facility] CM/SW Contact:    Leeroy Cha, RN Phone Number: 03/16/2021, 7:31 AM  Clinical Narrative:                 70 year old male with a history normal pressure hydrocephalus, Parkinson's disease, and a remote history T1 esophageal cancer presents today for further discussion and management his left renal mass. The patient underwent a CT scan because of aggressive weight loss. He has lost 100 lb over the last year. However, he has regained 20 lb over the last several months. The CT scan shows a 7.5 x 5.3 cm mass in the left upper pole. There is also a suspicious lesion measuring 12 mm on the medial aspect of the right upper pole.    The patient suffers from dementia, related to his Parkinson's syndrome. Claims that he lost weight when he stopped eating, not hungry. He subsequently has started eating quite for ratio sleep. He is now 20 lb heavier than he was several months ago.    The patient recently had a shunt for his normal pressure hydrocephalus. Initially this was quite successful. However, he has started to have worsening ataxia. He is also severely incontinent. They are scheduled to see the neurosurgeon for adjustment of the shunt.  Preoperative diagnosis:  Left renal mass  Abdominal wall sebaceous cyst     Postoperative diagnosis:  same    Procedure: Laparoscopic left radical nephrectomy Sebaceous cyst excision Extensive lysis of adhesions - 1min   TOC PLAN OF CARE lives at [facility] with wife who is the legal guardian [phone]. May need hhc if any drains present at dc.  Wife is the main caregiver.  Following for progression. Expected Discharge Plan: [place] Barriers to Discharge: Continued Medical Work up   Patient Goals and CMS Choice Patient states their goals for this hospitalization  and ongoing recovery are:: pt has dementia and legal guarcian-wife CMS Medicare.gov Compare Post Acute Care list provided to:: Legal Guardian    Expected Discharge Plan and Services Expected Discharge Plan: [place]   Discharge Planning Services: CM Consult   Living arrangements for the past 2 months: Apartment                                      Prior Living Arrangements/Services Living arrangements for the past 2 months: Apartment Lives with:: Spouse Patient language and need for interpreter reviewed:: Yes Do you feel safe going back to the place where you live?: Yes      Need for Family Participation in Patient Care: Yes (Comment) Care giver support system in place?: Yes (comment)   Criminal Activity/Legal Involvement Pertinent to Current Situation/Hospitalization: No - Comment as needed  Activities of Daily Living Home Assistive Devices/Equipment: Eyeglasses, Walker (specify type), Blood pressure cuff, Built-in shower seat, Grab bars in shower, Hand-held shower hose ADL Screening (condition at time of admission) Patient's cognitive ability adequate to safely complete daily activities?: Yes Is the patient deaf or have difficulty hearing?: No Does the patient have difficulty seeing, even when wearing glasses/contacts?: No Does the patient have difficulty concentrating, remembering, or making decisions?: Yes Patient able to express need for assistance with ADLs?: Yes Does the patient have difficulty dressing or bathing?: Yes Independently  performs ADLs?: No Communication: Independent Dressing (OT): Needs assistance Is this a change from baseline?: Pre-admission baseline Grooming: Needs assistance Is this a change from baseline?: Pre-admission baseline Feeding: Independent Bathing: Needs assistance Is this a change from baseline?: Pre-admission baseline Toileting: Needs assistance Is this a change from baseline?: Pre-admission baseline In/Out  Bed: Needs assistance Is this a change from baseline?: Pre-admission baseline Walks in Home: Needs assistance Is this a change from baseline?: Pre-admission baseline Does the patient have difficulty walking or climbing stairs?: Yes Weakness of Legs: Both Weakness of Arms/Hands: None  Permission Sought/Granted                  Emotional Assessment Appearance:: Appears stated age Attitude/Demeanor/Rapport: Unable to Assess Affect (typically observed): Unable to Assess Orientation: : Fluctuating Orientation (Suspected and/or reported Sundowners) Alcohol / Substance Use: Not Applicable Psych Involvement: No (comment)  Admission diagnosis:  Left renal mass [N28.89] Patient Active Problem List   Diagnosis Date Noted   Left renal mass 03/15/2021   Normal pressure hydrocephalus ([place]) 08/14/2020   GERD (gastroesophageal reflux disease) 06/28/2020   Malnutrition of moderate degree 06/27/2020   Type 2 diabetes mellitus with hyperglycemia ([place]) 06/25/2020   Lobar pneumonia ([place]) 06/25/2020   Fall at home, initial encounter 06/25/2020   Cognitive impairment 06/25/2020   Sepsis secondary to UTI ([place]) 06/24/2020   Barrett esophagus    Mild protein malnutrition ([place])    Diabetes mellitus ([place])    Hypertension    Chronic kidney disease (CKD), stage III (moderate) ([place])    PCP:  [name], MD Pharmacy:   [place] [place] [address] Phone: [phone] Fax: [phone]     Social Determinants of Health (SDOH) Interventions    Readmission Risk Interventions Readmission Risk Prevention Plan 06/27/2020  Post Dischage Appt Complete  Medication Screening Complete  Transportation Screening Complete  Some recent data might be hidden

## 2021-03-16 NOTE — Progress Notes (Signed)
Initial Nutrition Assessment  DOCUMENTATION CODES:   Severe malnutrition in context of chronic illness, Underweight  INTERVENTION:  - continue Boost Breeze TID, each supplement provides 250 kcal and 9 grams of protein. - will order Ensure Enlive BID, each supplement provides 350 kcal and 20 grams of protein. - will order 1 tablet multivitamin with minerals/day.    NUTRITION DIAGNOSIS:   Severe Malnutrition related to chronic illness as evidenced by severe fat depletion, severe muscle depletion.  GOAL:   Patient will meet greater than or equal to 90% of their needs  MONITOR:   PO intake, Supplement acceptance, Labs, Weight trends  REASON FOR ASSESSMENT:   Malnutrition Screening Tool  ASSESSMENT:   70 year old male with medical history of hydrocephalus, Parkinson's disease, GERD, Barrett's esophagus, gastroparesis, HTN, DM, stage 3 CKD, sleep apnea, anemia, and depression. He has a remote hx of esophageal cancer. He presented to the ED for further discussion and management of L renal mass he reported 100 lb weight loss in the past 1 year. CT showed 7.5 x 5.3 cm mass in L upper pole and suspicious lesion of 12 mm on the medial aspect of the R upper pole.  Patient ate 100% of breakfast this AM and reports eating well for lunch. He states that sometimes PTA he forgets when to eat and has to be reminded/encouraged. Patient denies any chewing or swallowing difficulties or forgetting that he's eating in the midst of eating.   He does have difficulty with ambulating in part d/t severe weakness. He has a walker but does find it difficult to use at times d/t upper body weakness.   He is having severe back pain today, especially with movement.   Boost Breeze was ordered TID yesterday and patient has accepted this supplement each time it is offered to him.  Weight yesterday was 140 lb and weight on 11/23/20 was 148 lb. This indicates 8 lb weight loss (5.4% body weight) in the past 3.5  months; not significant for time frame. Weight on 05/01/20 was 167 lb. This indicates 27 lb weight loss (16% body weight) in the past 10 months.   He is POD #1 lap L radical nephrectomy and extensive LOAs and sebaceous cyst excision.   Labs reviewed; CBGs: 89, 143, and 227 mg/dl, BUN: 35 mg/dl, creatinine: 1.87 mg/dl, Ca: 8.6 mg/dl, GFR: 38 ml/min. Medications reviewed.     NUTRITION - FOCUSED PHYSICAL EXAM:  Flowsheet Row Most Recent Value  Orbital Region Moderate depletion  Upper Arm Region Severe depletion  Thoracic and Lumbar Region Severe depletion  Buccal Region Severe depletion  Temple Region Moderate depletion  Clavicle Bone Region Severe depletion  Clavicle and Acromion Bone Region Severe depletion  Scapular Bone Region Unable to assess  Dorsal Hand Moderate depletion  Patellar Region Severe depletion  Anterior Thigh Region Severe depletion  Posterior Calf Region Severe depletion  Edema (RD Assessment) None  Hair Reviewed  Eyes Reviewed  Mouth Reviewed  Skin Reviewed  Nails Reviewed       Diet Order:   Diet Order             DIET SOFT Room service appropriate? Yes; Fluid consistency: Thin  Diet effective now                   EDUCATION NEEDS:   Not appropriate for education at this time  Skin:  Skin Assessment: Skin Integrity Issues: Skin Integrity Issues:: Incisions Incisions: L abdomen (10/27)  Last BM:  PTA/unknown  Height:  Ht Readings from Last 1 Encounters:  03/15/21 6\' 5"  (1.956 m)    Weight:   Wt Readings from Last 1 Encounters:  03/15/21 63.5 kg     Estimated Nutritional Needs:  Kcal:  2100-2350 kcal Protein:  105-115 grams Fluid:  >/= 2.5 L/day     Jarome Matin, MS, RD, LDN, CNSC Inpatient Clinical Dietitian RD pager # available in [facility]  After hours/weekend pager # available in [facility]

## 2021-03-16 NOTE — Progress Notes (Addendum)
1 Day Post-Op s/p laparoscopic left radical nephrectomy and extensive lysis of adhesions and sebaceous cyst excision  Subjective: No complaints this AM Denies any nausea - tolerating clear liquids  Objective: Vital signs in last 24 hours: Temp:  [97.4 F (36.3 C)-98.2 F (36.8 C)] 97.6 F (36.4 C) (10/28 0447) Pulse Rate:  [64-78] 71 (10/28 0447) Resp:  [10-20] 20 (10/28 0447) BP: (110-142)/(70-86) 110/70 (10/28 0447) SpO2:  [100 %] 100 % (10/28 0447)  Intake/Output from previous day: 10/27 0701 - 10/28 0700 In: 3200 [P.O.:500; I.V.:2300; IV Piggyback:400] Out: 540 [Urine:290; Blood:50] Intake/Output this shift: Total I/O In: 240 [P.O.:240] Out: 450 [Urine:450]  Physical Exam:  General: Alert and oriented Lungs: Non-labored breathing, on room air Abdomen: Soft,appropriately tender Incisions: c/d/I, packing removed from sebaceous cyst excision in LUQ and dressed. Ext: NT, No erythema  Lab Results: Recent Labs    03/15/21 1655 03/16/21 0423  HGB 11.4* 9.9*  HCT 34.9* 30.4*   BMET Recent Labs    03/15/21 1655 03/16/21 0423  NA 136 135  K 3.5 4.1  CL 100 101  CO2 26 28  GLUCOSE 177* 97  BUN 34* 35*  CREATININE 1.65* 1.87*  CALCIUM 8.9 8.6*     Studies/Results: No results found.  Assessment/Plan: S/p left nephrectomy for large renal mass, extensive lysis of bowel adhesions and a sebaceous cyst excision with baseline parkinson dementia and recent profound weight loss, POD #1, recovering well.  - Continue c IV tylenol until discharge, minimize narcotics - ADAT, carb modified diet - HLIVF - Monitor renal function - Continue abx for pre-op UTI. - D/C foley, condom catheter okay. - Ambulate in hall today, assess for PT consult - Resume home meds. - Will monitor one more day prior to discharge- which expect will be tomorrow.    LOS: 1 day   Ardis Hughs 03/16/2021, 12:33 PM

## 2021-03-16 NOTE — Plan of Care (Signed)
  Problem: Education: Goal: Knowledge of General Education information will improve Description: Including pain rating scale, medication(s)/side effects and non-pharmacologic comfort measures Outcome: Progressing   Problem: Clinical Measurements: Goal: Will remain free from infection Outcome: Progressing Goal: Diagnostic test results will improve Outcome: Progressing   Problem: Activity: Goal: Risk for activity intolerance will decrease Outcome: Progressing   

## 2021-03-17 LAB — BASIC METABOLIC PANEL
Anion gap: 5 (ref 5–15)
BUN: 45 mg/dL — ABNORMAL HIGH (ref 8–23)
CO2: 29 mmol/L (ref 22–32)
Calcium: 8.4 mg/dL — ABNORMAL LOW (ref 8.9–10.3)
Chloride: 100 mmol/L (ref 98–111)
Creatinine, Ser: 2.08 mg/dL — ABNORMAL HIGH (ref 0.61–1.24)
GFR, Estimated: 33 mL/min — ABNORMAL LOW (ref >60)
Glucose, Bld: 164 mg/dL — ABNORMAL HIGH (ref 70–99)
Potassium: 4.1 mmol/L (ref 3.5–5.1)
Sodium: 134 mmol/L — ABNORMAL LOW (ref 135–145)

## 2021-03-17 LAB — CBC
HCT: 27.7 % — ABNORMAL LOW (ref 39.0–52.0)
Hemoglobin: 8.7 g/dL — ABNORMAL LOW (ref 13.0–17.0)
MCH: 31.2 pg (ref 26.0–34.0)
MCHC: 31.4 g/dL (ref 30.0–36.0)
MCV: 99.3 fL (ref 80.0–100.0)
Platelets: 176 10*3/uL (ref 150–400)
RBC: 2.79 MIL/uL — ABNORMAL LOW (ref 4.22–5.81)
RDW: 13.2 % (ref 11.5–15.5)
WBC: 6.7 10*3/uL (ref 4.0–10.5)
nRBC: 0 % (ref 0.0–0.2)

## 2021-03-17 NOTE — Evaluation (Signed)
Physical Therapy Evaluation Patient Details Name: [name] MRN: [id number] DOB: 1950-06-09 Today's Date: 03/17/2021  History of Present Illness  Pt 70 yo male admitted on 03/15/21 for radical L nephrectomy for large renal mass, extensive lysis of bowel adhesion and cyst excision.  Pt with hx of Parkinsons, dementia, normal pressure hydrocephalus with shunt  Clinical Impression  Pt admitted with above diagnosis. At baseline, pt resides at [facility] with wife.  She reports he needs assist for all ADLs and supervision with gait using RW.  States somedays are better than others.  Today, pt requiring min A for transfers and ambulated 180' with RW and min guard.  He required cues and increased time to respond.  Suspect pt is near baseline and would benefit from return home with HHPT.  Will maintain on caseload as pt does have decreased mobility.  Pt currently with functional limitations due to the deficits listed below (see PT Problem List). Pt will benefit from skilled PT to increase their independence and safety with mobility to allow discharge to the venue listed below.          Recommendations for follow up therapy are one component of a multi-disciplinary discharge planning process, led by the attending physician.  Recommendations may be updated based on patient status, additional functional criteria and insurance authorization.  Follow Up Recommendations Home health PT    Assistance Recommended at Discharge Frequent or constant Supervision/Assistance  Functional Status Assessment Patient has had a recent decline in their functional status and demonstrates the ability to make significant improvements in function in a reasonable and predictable amount of time.  Equipment Recommendations  None recommended by PT    Recommendations for Other Services       Precautions / Restrictions Precautions Precautions: Fall      Mobility  Bed Mobility Overal bed mobility: Needs Assistance Bed  Mobility: Supine to Sit     Supine to sit: Min assist     General bed mobility comments: Min A to lift trunk; increased time and cues    Transfers Overall transfer level: Needs assistance Equipment used: Rolling walker (2 wheels) Transfers: Sit to/from Stand Sit to Stand: Min assist           General transfer comment: Min A from bed and recliner with cues for hand placement    Ambulation/Gait Ambulation/Gait assistance: Min guard Gait Distance (Feet): 180 Feet Assistive device: Rolling walker (2 wheels) Gait Pattern/deviations: Step-through pattern Gait velocity: normal   General Gait Details: Step through gait ; min cues for posture and RW proximity; no LOB  Stairs            Wheelchair Mobility    Modified Rankin (Stroke Patients Only)       Balance Overall balance assessment: Needs assistance Sitting-balance support: No upper extremity supported Sitting balance-Leahy Scale: Good     Standing balance support: Bilateral upper extremity supported Standing balance-Leahy Scale: Poor Standing balance comment: utilized RW but steady                             Pertinent Vitals/Pain Pain Assessment: Faces Faces Pain Scale: Hurts little more Pain Location: R shoulder/chest Pain Descriptors / Indicators:  (pulling) Pain Intervention(s): Limited activity within patient's tolerance;Monitored during session    Home Living Family/patient expects to be discharged to:: Private residence Living Arrangements: Spouse/significant other (wife [name]) Available Help at Discharge: Family;Available 24 hours/day Type of Home: Independent living facility [facility]  green ILF) Home Access: Level entry       Home Layout: One level Home Equipment: Conservation officer, nature (2 wheels);Shower seat;Cane - single point;Grab bars - tub/shower;Grab bars - toilet;BSC      Prior Function Prior Level of Function : Needs assist       Physical Assist : ADLs (physical)    ADLs (physical): Bathing;Dressing;Toileting;IADLs Mobility Comments: Pt reports depends on day.  Somedays he is able to ambulate at home and others he cannot. Always uses RW.  Wife assist with walking ADLs Comments: Sometimes he needs assist with ADLs - depenes on day     Hand Dominance   Dominant Hand: Right    Extremity/Trunk Assessment   Upper Extremity Assessment Upper Extremity Assessment: LUE deficits/detail;RUE deficits/detail RUE Deficits / Details: ROM WFL ; MMT 4/5 LUE Deficits / Details: ROM WFL ; MMT 4/5    Lower Extremity Assessment Lower Extremity Assessment: LLE deficits/detail;RLE deficits/detail RLE Deficits / Details: ROM WFL ; MMT 4/5 LLE Deficits / Details: ROM WFL ; MMT 4/5    Cervical / Trunk Assessment Cervical / Trunk Assessment: Kyphotic  Communication   Communication: No difficulties  Cognition Arousal/Alertness: Awake/alert Behavior During Therapy: Flat affect Overall Cognitive Status: History of cognitive impairments - at baseline                                 General Comments: Pt with hx of dementia and Parkinsons.  Increased time to respond and followed basic commands. Confirmed PLOF on phone with wife        General Comments General comments (skin integrity, edema, etc.): Spoke with Wife [name] on phone regarding recommendations and PLOF    Exercises     Assessment/Plan    PT Assessment Patient needs continued PT services  PT Problem List Decreased strength;Decreased mobility;Decreased safety awareness;Decreased coordination;Decreased activity tolerance;Decreased balance;Decreased knowledge of use of DME       PT Treatment Interventions DME instruction;Therapeutic activities;Gait training;Therapeutic exercise;Patient/family education;Balance training;Functional mobility training    PT Goals (Current goals can be found in the Care Plan section)  Acute Rehab PT Goals Patient Stated Goal: return home PT Goal  Formulation: With patient/family Time For Goal Achievement: 03/31/21 Potential to Achieve Goals: Good    Frequency Min 3X/week   Barriers to discharge        Co-evaluation               AM-PAC PT "6 Clicks" Mobility  Outcome Measure Help needed turning from your back to your side while in a flat bed without using bedrails?: A Little Help needed moving from lying on your back to sitting on the side of a flat bed without using bedrails?: A Little Help needed moving to and from a bed to a chair (including a wheelchair)?: A Little Help needed standing up from a chair using your arms (e.g., wheelchair or bedside chair)?: A Little Help needed to walk in hospital room?: A Little Help needed climbing 3-5 steps with a railing? : A Lot 6 Click Score: 17    End of Session Equipment Utilized During Treatment: Gait belt Activity Tolerance: Patient tolerated treatment well Patient left: with chair alarm set;in chair;with call bell/phone within reach Nurse Communication: Mobility status PT Visit Diagnosis: Other abnormalities of gait and mobility (R26.89);Muscle weakness (generalized) (M62.81)    Time: 9470-9628 PT Time Calculation (min) (ACUTE ONLY): 35 min   Charges:   PT Evaluation $PT Eval  Moderate Complexity: 1 Mod PT Treatments $Gait Training: 8-22 mins        Abran Richard, PT Acute Rehab Services Pager [phone] [facility] [place] 03/17/2021, 5:23 PM

## 2021-03-17 NOTE — TOC Transition Note (Signed)
Transition of Care [facility]) - CM/SW Discharge Note   Patient Details  Name: [name] MRN: [id number] Date of Birth: 02/03/1951  Transition of Care [facility]) CM/SW Contact:  Ross Ludwig, LCSW Phone Number: 03/17/2021, 11:58 AM   Clinical Narrative:     Patient will be going home no needs.  CSW signing off please reconsult with any other social work needs.    Final next level of care: Home/Self Care Barriers to Discharge: Barriers Resolved   Patient Goals and CMS Choice Patient states their goals for this hospitalization and ongoing recovery are:: To return back home. CMS Medicare.gov Compare Post Acute Care list provided to:: Patient Choice offered to / list presented to : Patient  Discharge Placement                       Discharge Plan and Services   Discharge Planning Services: CM Consult                                 Social Determinants of Health (SDOH) Interventions     Readmission Risk Interventions Readmission Risk Prevention Plan 06/27/2020  Post Dischage Appt Complete  Medication Screening Complete  Transportation Screening Complete  Some recent data might be hidden

## 2021-03-17 NOTE — Plan of Care (Signed)
  Problem: Education: Goal: Knowledge of General Education information will improve Description: Including pain rating scale, medication(s)/side effects and non-pharmacologic comfort measures Outcome: Progressing   Problem: Clinical Measurements: Goal: Diagnostic test results will improve Outcome: Progressing   Problem: Activity: Goal: Risk for activity intolerance will decrease Outcome: Progressing   

## 2021-03-17 NOTE — Progress Notes (Addendum)
2 Days Post-Op Subjective: Patient reports no complaints.  He sitting in a chair  Objective: Vital signs in last 24 hours: Temp:  [98 F (36.7 C)-98.5 F (36.9 C)] 98.1 F (36.7 C) (10/29 0431) Pulse Rate:  [65-88] 65 (10/29 0431) Resp:  [2-20] 20 (10/29 0431) BP: (101-122)/(62-76) 122/76 (10/29 0431) SpO2:  [98 %-100 %] 100 % (10/29 0431)  Intake/Output from previous day: 10/28 0701 - 10/29 0700 In: 1160 [P.O.:960; IV Piggyback:200] Out: 1000 [Urine:1000] Intake/Output this shift: Total I/O In: 240 [P.O.:240] Out: -   Physical Exam:  He looks well, sitting in the chair watching TV, alert Cardiovascular-regular rate and rhythm Respiratory-lungs clear to auscultation bilaterally Abdomen-incisions clean dry intact.  Soft and nontender.  Good bowel sounds by auscultation.  Nondistended. Extremity-no calf pain or swelling GU-condom catheter in place, urine clear  Lab Results: Recent Labs    03/15/21 1655 03/16/21 0423 03/17/21 0455  HGB 11.4* 9.9* 8.7*  HCT 34.9* 30.4* 27.7*   BMET Recent Labs    03/16/21 0423 03/17/21 0455  NA 135 134*  K 4.1 4.1  CL 101 100  CO2 28 29  GLUCOSE 97 164*  BUN 35* 45*  CREATININE 1.87* 2.08*  CALCIUM 8.6* 8.4*   No results for input(s): LABPT, INR in the last 72 hours. No results for input(s): LABURIN in the last 72 hours. Results for orders placed or performed in visit on 03/13/21  SARS Coronavirus 2 (TAT 6-24 hrs)     Status: None   Collection Time: 03/13/21 12:00 AM  Result Value Ref Range Status   SARS Coronavirus 2 RESULT: NEGATIVE  Final    Comment: RESULT: NEGATIVESARS-CoV-2 INTERPRETATION:A NEGATIVE  test result means that SARS-CoV-2 RNA was not present in the specimen above the limit of detection of this test. This does not preclude a possible SARS-CoV-2 infection and should not be used as the  sole basis for patient management decisions. Negative results must be combined with clinical observations, patient history,  and epidemiological information. Optimum specimen types and timing for peak viral levels during infections caused by SARS-CoV-2  have not been determined. Collection of multiple specimens or types of specimens may be necessary to detect virus. Improper specimen collection and handling, sequence variability under primers/probes, or organism present below the limit of detection may  lead to false negative results. Positive and negative predictive values of testing are highly dependent on prevalence. False negative test results are more likely when prevalence of disease is high.The expected result is NEGATIVE.Fact S heet for  Healthcare Providers: LocalChronicle.no Sheet for Patients: SalonLookup.es Reference Range - Negative     Studies/Results: No results found.  Assessment/Plan: Postoperative day #2 left laparoscopic radical nephrectomy-extensive lysis of adhesions-primary urologist had considered discharging home this morning, but patient coming along more slowly with his physical activity and failed voiding trial.   PT evaluation pending. D/c foley in AM.  Otherwise he is doing well on regular diet.   LOS: 2 days   Festus Aloe 03/17/2021, 12:23 PM

## 2021-03-17 NOTE — Discharge Summary (Signed)
Physician Discharge Summary  Patient ID: [name] MRN: [id number] DOB/AGE: August 30, 1950 70 y.o.  Admit date: 03/15/2021 Discharge date: 03/17/2021  Admission Diagnoses:  Discharge Diagnoses:  Active Problems:   Left renal mass   Discharged Condition: good  Hospital Course: [name] was admitted following left laparoscopic radical nephrectomy on 03/15/2021.  He has done well.  By postop day 2 he is ambulating and tolerating a regular diet.  Ready for discharge.  Consults: None  Significant Diagnostic Studies: None  Treatments: surgery: Left laparoscopic radical nephrectomy, sebaceous cyst excision  Discharge Exam: Blood pressure 122/76, pulse 65, temperature 98.1 F (36.7 C), temperature source Oral, resp. rate 20, height 6\' 5"  (1.956 m), weight 63.5 kg, SpO2 100 %. He looks well, sitting in the chair watching TV, alert Cardiovascular-regular rate and rhythm Respiratory-lungs clear to auscultation bilaterally Abdomen-incisions clean dry intact.  Soft and nontender.  Good bowel sounds by auscultation.  Nondistended. Extremity-no calf pain or swelling GU-condom catheter in place, urine clear  Disposition: Discharge disposition: 01-Home or Self Care       Discharge Instructions     Discharge patient   Complete by: As directed    Discharge disposition: 01-Home or Self Care   Discharge patient date: 03/17/2021      Allergies as of 03/17/2021       Reactions   Duragen [estradiol Valerate] Other (See Comments)   unknown   Fentanyl Other (See Comments)   hallucinations   Morphine And Related Other (See Comments)   Hallucinations.        Medication List     TAKE these medications    acetaminophen 500 MG tablet Commonly known as: TYLENOL Take 500-1,000 mg by mouth every 6 (six) hours as needed for moderate pain or headache.   amoxicillin-clavulanate 500-125 MG tablet Commonly known as: AUGMENTIN Take 1 tablet by mouth 2 (two) times daily.   bismuth  subsalicylate 315 QM/08QP suspension Commonly known as: PEPTO BISMOL Take 30 mLs by mouth every 6 (six) hours as needed for diarrhea or loose stools or indigestion.   esomeprazole 20 MG capsule Commonly known as: NEXIUM Take 40 mg by mouth daily.   Fusion Plus Caps Take 1 capsule by mouth daily.   losartan-hydrochlorothiazide 50-12.5 MG tablet Commonly known as: HYZAAR Take 1 tablet by mouth daily.   metFORMIN 500 MG 24 hr tablet Commonly known as: GLUCOPHAGE-XR Take 500 mg by mouth daily with supper.   multivitamin with minerals Tabs tablet Take 1 tablet by mouth daily. Centrum   traMADol 50 MG tablet Commonly known as: Ultram Take 1-2 tablets (50-100 mg total) by mouth every 6 (six) hours as needed for moderate pain.        Follow-up Information     Karen Kays, NP Follow up on 03/29/2021.   Specialty: Nurse Practitioner Why: 9:30 Contact information: [address] [place] [address] [phone]                 Signed: Festus Aloe 03/17/2021, 11:36 AM

## 2021-03-18 LAB — CBC
HCT: 29.7 % — ABNORMAL LOW (ref 39.0–52.0)
Hemoglobin: 9.6 g/dL — ABNORMAL LOW (ref 13.0–17.0)
MCH: 31.5 pg (ref 26.0–34.0)
MCHC: 32.3 g/dL (ref 30.0–36.0)
MCV: 97.4 fL (ref 80.0–100.0)
Platelets: 193 10*3/uL (ref 150–400)
RBC: 3.05 MIL/uL — ABNORMAL LOW (ref 4.22–5.81)
RDW: 13 % (ref 11.5–15.5)
WBC: 6.9 10*3/uL (ref 4.0–10.5)
nRBC: 0 % (ref 0.0–0.2)

## 2021-03-18 LAB — BASIC METABOLIC PANEL
Anion gap: 6 (ref 5–15)
BUN: 48 mg/dL — ABNORMAL HIGH (ref 8–23)
CO2: 29 mmol/L (ref 22–32)
Calcium: 8.7 mg/dL — ABNORMAL LOW (ref 8.9–10.3)
Chloride: 101 mmol/L (ref 98–111)
Creatinine, Ser: 1.83 mg/dL — ABNORMAL HIGH (ref 0.61–1.24)
GFR, Estimated: 39 mL/min — ABNORMAL LOW (ref >60)
Glucose, Bld: 122 mg/dL — ABNORMAL HIGH (ref 70–99)
Potassium: 4.1 mmol/L (ref 3.5–5.1)
Sodium: 136 mmol/L (ref 135–145)

## 2021-03-18 NOTE — Discharge Summary (Signed)
Physician Discharge Summary  Patient ID: [name] MRN: [id number] DOB/AGE: 1950/09/30 70 y.o.  Admit date: 03/15/2021 Discharge date: 03/18/2021  Admission Diagnoses:  Discharge Diagnoses:  Active Problems:   Left renal mass   Discharged Condition: good  Hospital Course: Patient was admitted following laparoscopic left nephrectomy 03/15/2021.  By postop day 2 he continued to exhibit some weakness and had failed a voiding trial.  Physical therapy evaluated the patient and recommended home health PT.  By postop day 3 he was ambulating with PT has helped, tolerating a regular diet and voiding on his own.  His hemoglobin was stable at 9.6 and creatinine improved to 1.8.  Consults:  Physical therapy  Significant Diagnostic Studies: None  Treatments: surgery: Laparoscopic left radical nephrectomy, sebaceous cyst excision  Discharge Exam: Blood pressure (!) 155/98, pulse 92, temperature 98.5 F (36.9 C), temperature source Oral, resp. rate 18, height 6\' 5"  (1.956 m), weight 63.5 kg, SpO2 100 %. Patient looks well in bed, alert, eating lunch Cardiovascular-regular rate and rhythm Respiratory-clear to auscultation bilaterally Abdomen-soft and nontender.  Good bowel sounds to auscultation.  Incisions clean dry and intact. Extremity-no calf pain or swelling GU-condom cath in place with clear urine in the bag  Disposition: Discharge disposition: 01-Home or Self Care       Discharge Instructions     Discharge patient   Complete by: As directed    Discharge disposition: 01-Home or Self Care   Discharge patient date: 03/18/2021      Allergies as of 03/18/2021       Reactions   Duragen [estradiol Valerate] Other (See Comments)   unknown   Fentanyl Other (See Comments)   hallucinations   Morphine And Related Other (See Comments)   Hallucinations.        Medication List     TAKE these medications    acetaminophen 500 MG tablet Commonly known as:  TYLENOL Take 500-1,000 mg by mouth every 6 (six) hours as needed for moderate pain or headache.   amoxicillin-clavulanate 500-125 MG tablet Commonly known as: AUGMENTIN Take 1 tablet by mouth 2 (two) times daily.   bismuth subsalicylate 226 JF/35KT suspension Commonly known as: PEPTO BISMOL Take 30 mLs by mouth every 6 (six) hours as needed for diarrhea or loose stools or indigestion.   esomeprazole 20 MG capsule Commonly known as: NEXIUM Take 40 mg by mouth daily.   Fusion Plus Caps Take 1 capsule by mouth daily.   losartan-hydrochlorothiazide 50-12.5 MG tablet Commonly known as: HYZAAR Take 1 tablet by mouth daily.   metFORMIN 500 MG 24 hr tablet Commonly known as: GLUCOPHAGE-XR Take 500 mg by mouth daily with supper.   multivitamin with minerals Tabs tablet Take 1 tablet by mouth daily. Centrum   traMADol 50 MG tablet Commonly known as: Ultram Take 1-2 tablets (50-100 mg total) by mouth every 6 (six) hours as needed for moderate pain.        Follow-up Information     Karen Kays, NP Follow up on 03/29/2021.   Specialty: Nurse Practitioner Why: 9:30 Contact information: [address] [place]  [address] [phone]                 Signed: Festus Aloe 03/18/2021, 12:55 PM

## 2021-03-18 NOTE — TOC Transition Note (Signed)
Transition of Care [facility]) - CM/SW Discharge Note   Patient Details  Name: [name] MRN: [id number] Date of Birth: April 07, 1951  [facility]) CM/SW Contact:  Dessa Phi, RN Phone Number: 03/18/2021, 1:31 PM   Clinical Narrative: From [place] for HHPT-[facility] contract for HHPT @ [facility]-faxed HHPT order, & face to face to [facility] fax#[phone] w/confirmation. Family to transport home on own. No further CM needs.      Final next level of care: [place] Barriers to Discharge: No Barriers Identified   Patient Goals and CMS Choice Patient states their goals for this hospitalization and ongoing recovery are:: To return back home. CMS Medicare.gov Compare Post Acute Care list provided to:: Patient Choice offered to / list presented to : Patient  Discharge Placement                       Discharge Plan and Services   Discharge Planning Services: CM Consult                      HH Arranged: PT [facility] Agency: Other - See comment ([facility]) Date HH Agency Contacted: 03/18/21 Time HH Agency Contacted: 1330    Social Determinants of Health (SDOH) Interventions     Readmission Risk Interventions Readmission Risk Prevention Plan 06/27/2020  Post Dischage Appt Complete  Medication Screening Complete  Transportation Screening Complete  Some recent data might be hidden

## 2021-03-18 NOTE — Progress Notes (Signed)
Pt discharging to home this afternoon. Pt and Pt's Wife [name] given discharge teaching including discharge Medications and schedules for these Medications, Understanding by Pt and Wife. Discharge AVS with Pt at time of discharge VSS and no acute changes with Pt's assessment at time of discharge

## 2021-03-20 DIAGNOSIS — R262 Difficulty in walking, not elsewhere classified: Secondary | ICD-10-CM | POA: Diagnosis not present

## 2021-03-20 DIAGNOSIS — M6281 Muscle weakness (generalized): Secondary | ICD-10-CM | POA: Diagnosis not present

## 2021-03-20 DIAGNOSIS — R278 Other lack of coordination: Secondary | ICD-10-CM | POA: Diagnosis not present

## 2021-03-20 LAB — SURGICAL PATHOLOGY

## 2021-03-20 IMAGING — XA DG FLUORO GUIDE SPINAL/SI JT INJ*L*
1 series · 1 of 1 positions shown · non-contrast
Comparison: none

CLINICAL DATA: Progressive dementia, evaluate NPH

EXAM:
LUMBAR PUNCTURE UNDER FLUOROSCOPY
FLUOROSCOPY TIME:  27 seconds; 44 u5ym3 DAP
TECHNIQUE: The procedure, risks (including but not limited to bleeding,
infection, organ damage ), benefits, and alternatives were explained
to the patient and spouse. Questions regarding the procedure were
encouraged and answered. The spouse understands and consents to the
procedure. Patient placed left lateral decubitus. An appropriate
skin entry site was determined fluoroscopically. Operator donned
sterile gloves and mask. Skin site was marked, then prepped with
Betadine, draped in usual sterile fashion, and infiltrated locally
with 1% lidocaine. A 20 gauge spinal needle advanced into the thecal
sac at L3 from a right parasagittal interlaminar approach. Clear
colorless CSF spontaneously returned, with opening pressure of 10 cm
water. 20ml CSF were collected and divided among 4 sterile vials for
the requested laboratory studies. Closing pressure 1 cm H2O. The
needle was then removed.
COMPLICATIONS:
None immediate

[Series 1: ortho standard · 1 of 1 slices shown]
[im 1/1]
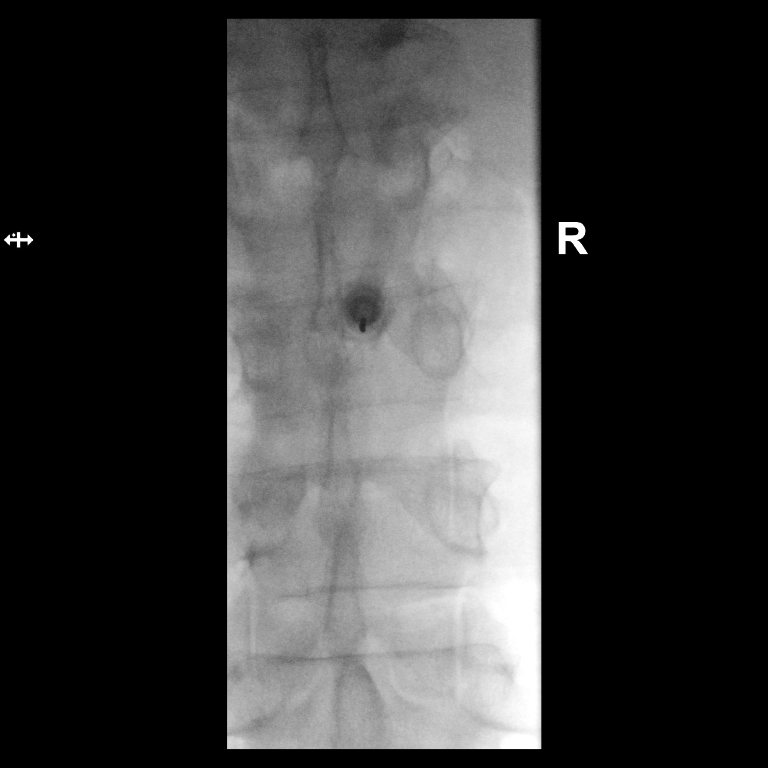

[1 of 1 positions shown; findings below may reference images not displayed]

IMPRESSION: 1. Technically successful lumbar puncture under fluoroscopy.

## 2021-03-26 DIAGNOSIS — M6281 Muscle weakness (generalized): Secondary | ICD-10-CM | POA: Diagnosis not present

## 2021-03-26 DIAGNOSIS — R262 Difficulty in walking, not elsewhere classified: Secondary | ICD-10-CM | POA: Diagnosis not present

## 2021-03-26 DIAGNOSIS — R278 Other lack of coordination: Secondary | ICD-10-CM | POA: Diagnosis not present

## 2021-03-27 DIAGNOSIS — R278 Other lack of coordination: Secondary | ICD-10-CM | POA: Diagnosis not present

## 2021-03-27 DIAGNOSIS — R262 Difficulty in walking, not elsewhere classified: Secondary | ICD-10-CM | POA: Diagnosis not present

## 2021-03-27 DIAGNOSIS — M6281 Muscle weakness (generalized): Secondary | ICD-10-CM | POA: Diagnosis not present

## 2021-04-03 DIAGNOSIS — R262 Difficulty in walking, not elsewhere classified: Secondary | ICD-10-CM | POA: Diagnosis not present

## 2021-04-03 DIAGNOSIS — M6281 Muscle weakness (generalized): Secondary | ICD-10-CM | POA: Diagnosis not present

## 2021-04-03 DIAGNOSIS — R278 Other lack of coordination: Secondary | ICD-10-CM | POA: Diagnosis not present

## 2021-04-05 DIAGNOSIS — R262 Difficulty in walking, not elsewhere classified: Secondary | ICD-10-CM | POA: Diagnosis not present

## 2021-04-05 DIAGNOSIS — M6281 Muscle weakness (generalized): Secondary | ICD-10-CM | POA: Diagnosis not present

## 2021-04-05 DIAGNOSIS — R278 Other lack of coordination: Secondary | ICD-10-CM | POA: Diagnosis not present

## 2021-04-06 DIAGNOSIS — R278 Other lack of coordination: Secondary | ICD-10-CM | POA: Diagnosis not present

## 2021-04-06 DIAGNOSIS — M6281 Muscle weakness (generalized): Secondary | ICD-10-CM | POA: Diagnosis not present

## 2021-04-06 DIAGNOSIS — R262 Difficulty in walking, not elsewhere classified: Secondary | ICD-10-CM | POA: Diagnosis not present

## 2021-04-09 ENCOUNTER — Other Ambulatory Visit: Payer: Self-pay

## 2021-04-09 DIAGNOSIS — Z515 Encounter for palliative care: Secondary | ICD-10-CM

## 2021-04-10 DIAGNOSIS — R262 Difficulty in walking, not elsewhere classified: Secondary | ICD-10-CM | POA: Diagnosis not present

## 2021-04-10 DIAGNOSIS — R278 Other lack of coordination: Secondary | ICD-10-CM | POA: Diagnosis not present

## 2021-04-10 DIAGNOSIS — M6281 Muscle weakness (generalized): Secondary | ICD-10-CM | POA: Diagnosis not present

## 2021-04-11 DIAGNOSIS — R262 Difficulty in walking, not elsewhere classified: Secondary | ICD-10-CM | POA: Diagnosis not present

## 2021-04-11 DIAGNOSIS — M6281 Muscle weakness (generalized): Secondary | ICD-10-CM | POA: Diagnosis not present

## 2021-04-11 DIAGNOSIS — R278 Other lack of coordination: Secondary | ICD-10-CM | POA: Diagnosis not present

## 2021-04-14 NOTE — Progress Notes (Signed)
COMMUNITY PALLIATIVE CARE SW NOTE  PATIENT NAME: [name] DOB: 1950/11/11 MRN: [id number]  PRIMARY CARE PROVIDER: [name], MD  RESPONSIBLE PARTY:  Acct ID - Guarantor Home Phone Work Phone Relationship Acct Type  [id number] - Lodwick,J* [phone]  Self P/F     [address], [place], [place] [address]     PLAN OF CARE and INTERVENTIONS:             GOALS OF CARE/ ADVANCE CARE PLANNING:  Goal is for patient to remain in his apartment with his wife. Patient is a full code.  SOCIAL/EMOTIONAL/SPIRITUAL ASSESSMENT/ INTERVENTIONS:   SW completed a check-in visit with patient at his apartment where his wife was present with him. Patient was sitting on the side of the bed awake, alert and eating. His wife reported that patient's kidney surgery was successful where his doctor's feel that all the cancer was removed. He is scheduled to have another MRI next month on his other kidney to assess for cancer. He has concluded physical therapy and has noted improved strength and ambulation. Patient report that he is feeling better. He is up ambulating with his cane around the facility. Patient remains thin and frail in appearance. SW updated palliative care RN. Patient and PCG remain open to ongoing palliative care support and SW will follow-up with them to schedule the next visit.  PATIENT/CAREGIVER EDUCATION/ COPING:  Patient appears to be in good spirits and seems to be coping well. His wife is supportive.  PERSONAL EMERGENCY PLAN:  Per facility protocol. COMMUNITY RESOURCES COORDINATION/ HEALTH CARE NAVIGATION: None.  FINANCIAL/LEGAL CONCERNS/INTERVENTIONS:  None.     SOCIAL HX:  Social History   Tobacco Use   Smoking status: Never   Smokeless tobacco: Never  Substance Use Topics   Alcohol use: Not Currently    Comment: occasionally drinks Bailey's    CODE STATUS: Full Code. ADVANCED DIRECTIVES: No MOST FORM COMPLETE:  No HOSPICE EDUCATION PROVIDED: No  PPS:  Patient is alert and oriented x3. His appetite has improved. He is ambulating with his a cane.   Duration of visit and documentation: 45 minutes  Katheren Puller, LCSW

## 2021-04-19 IMAGING — NM NM DATSCAN
2 series · 24 of 30 positions shown · non-contrast
Comparison: Brain MRI 01/23/2019

CLINICAL DATA: 69-year-old male. Cognitive decline. Shuffling gait.

EXAM:
NUCLEAR MEDICINE BRAIN IMAGING WITH SPECT  (DaTscan )
TECHNIQUE: SPECT images of the brain were obtained after intravenous injection
of radiopharmaceutical. 4 hour post injection imaging. Appropriate
positioning. 0.8 ml lugols solution administered in a.m
RADIOPHARMACEUTICALS:  5.1 millicuries I 123 Ioflupane

[Series 1: brain spect · 4.14mm/px · 5 of 120 frames shown]
[frame 11/120  full-range]
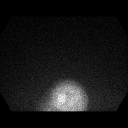
[frame 31/120  full-range]
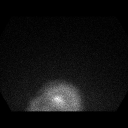
[frame 71/120  full-range]
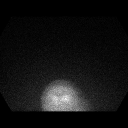
[frame 91/120  full-range]
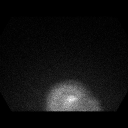
[frame 111/120  full-range]
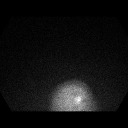

[Series 1016: mpr (id) range · 0.99mm/px · 19 of 45 slices shown]
[im 1/45]
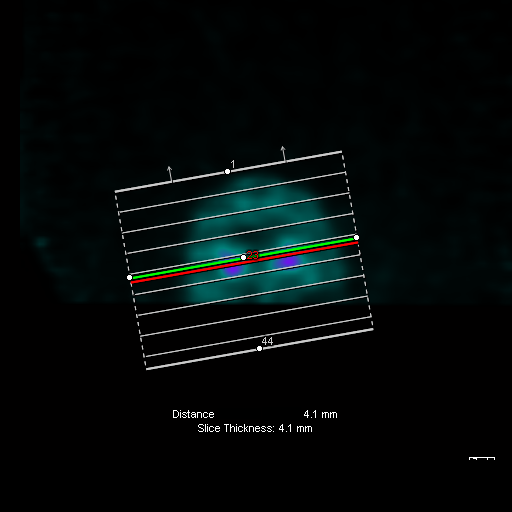
[im 4/45]
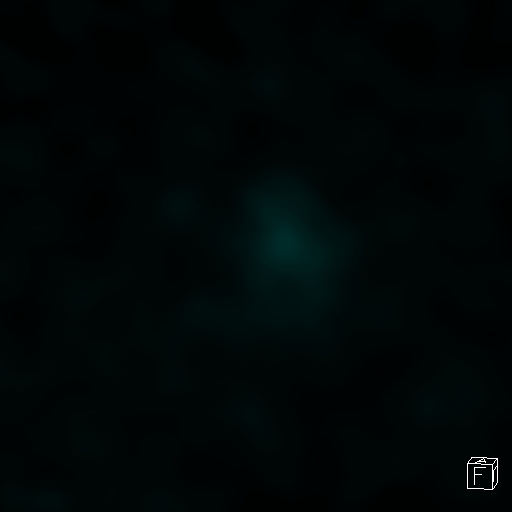
[im 6/45]
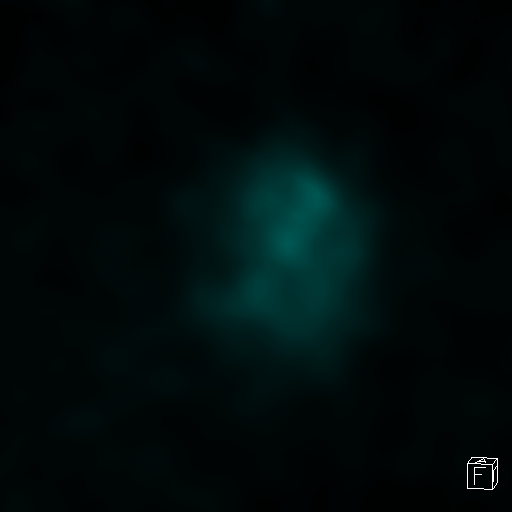
[im 8/45]
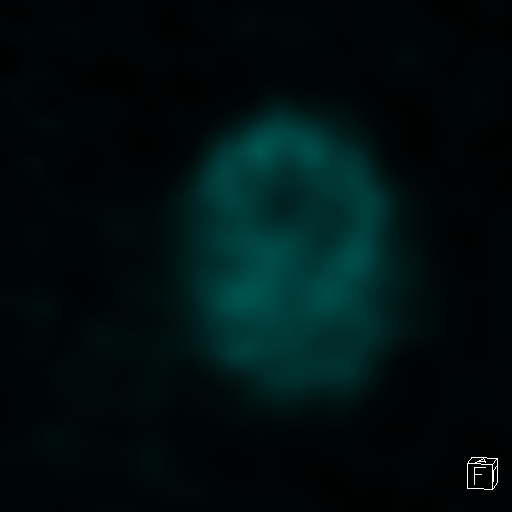
[im 10/45]
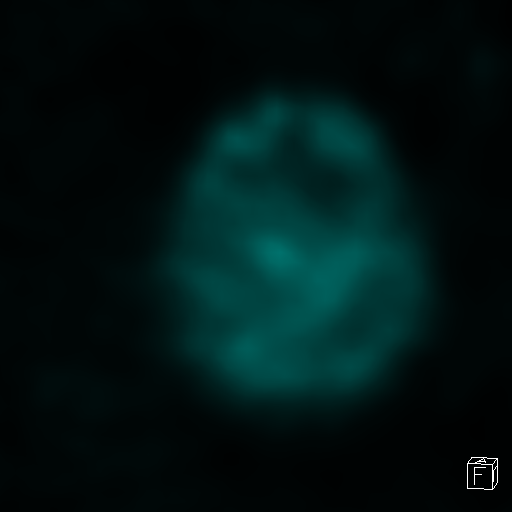
[im 14/45]
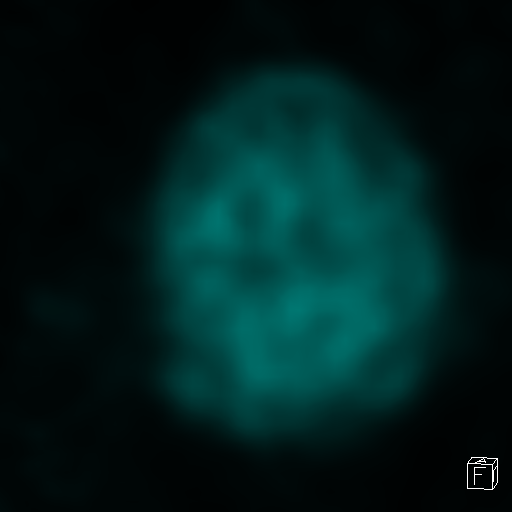
[im 16/45]
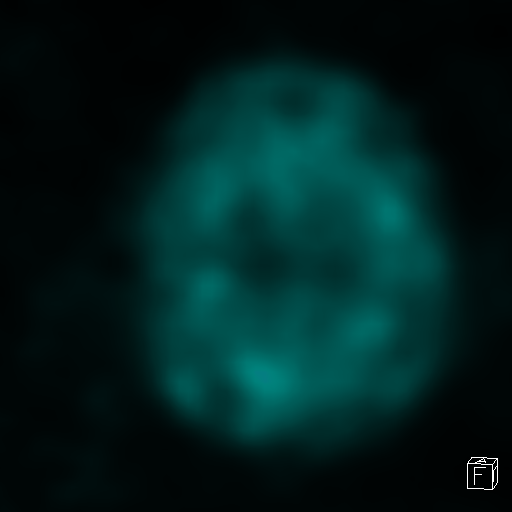
[im 18/45]
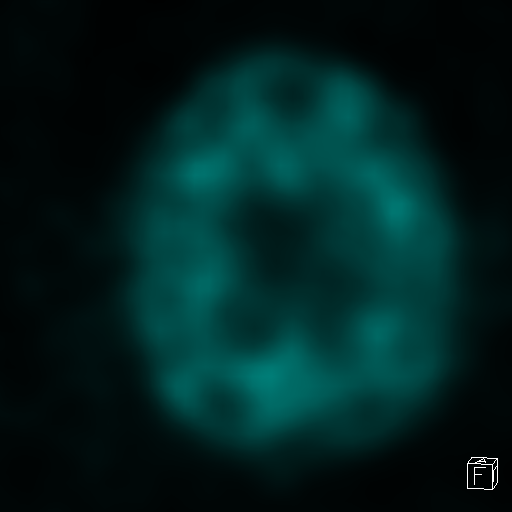
[im 20/45]
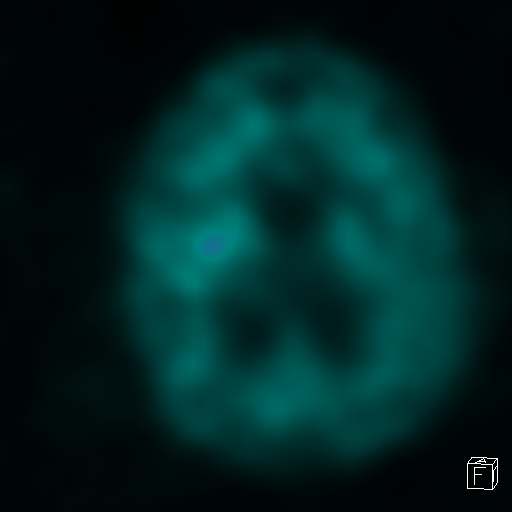
[im 23/45]
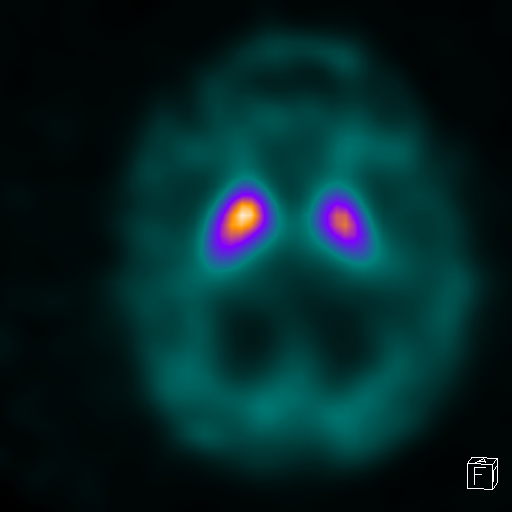
[im 25/45]
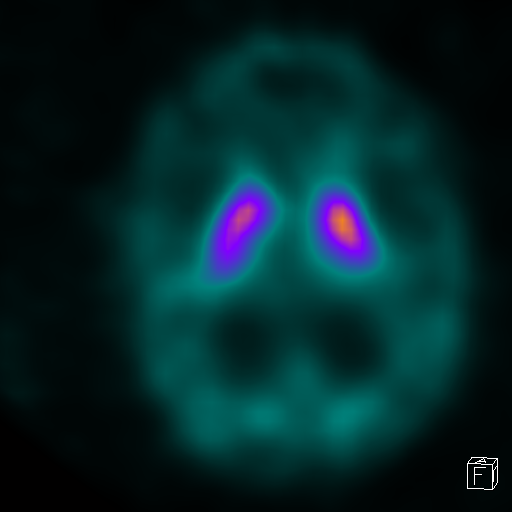
[im 27/45]
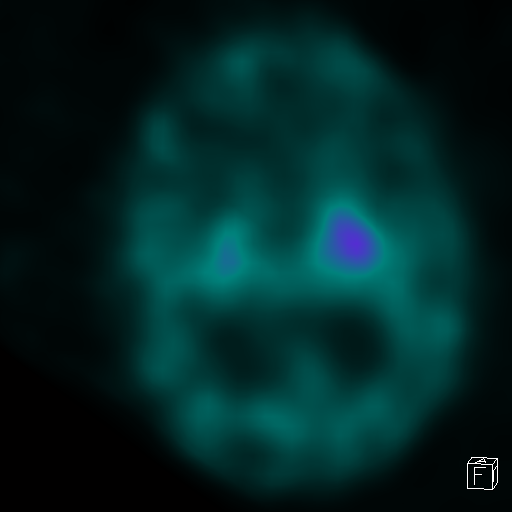
[im 29/45]
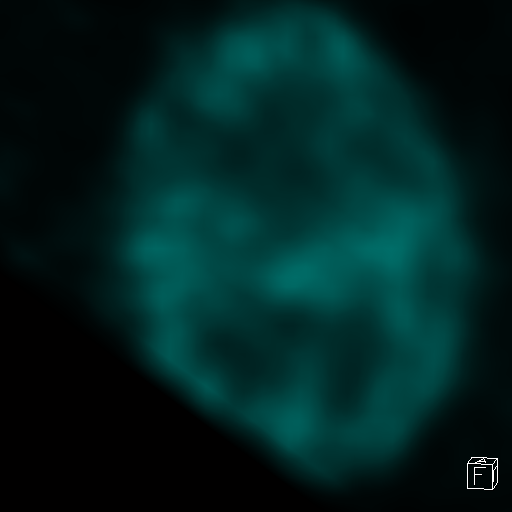
[im 33/45]
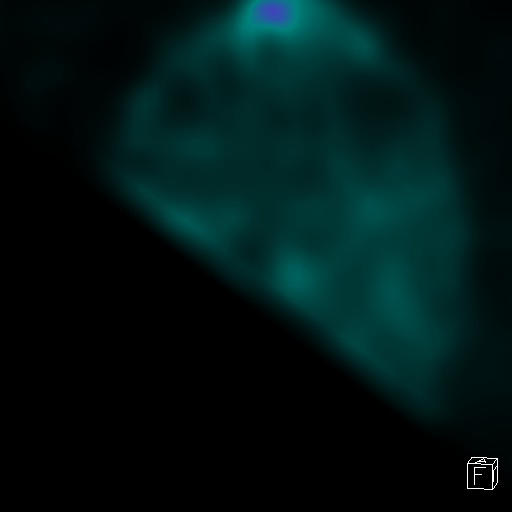
[im 35/45]
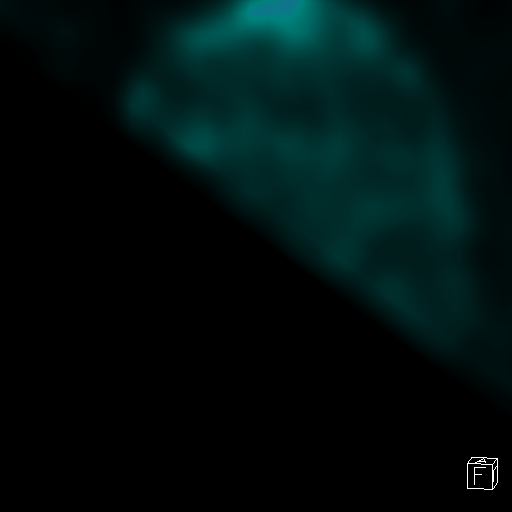
[im 37/45]
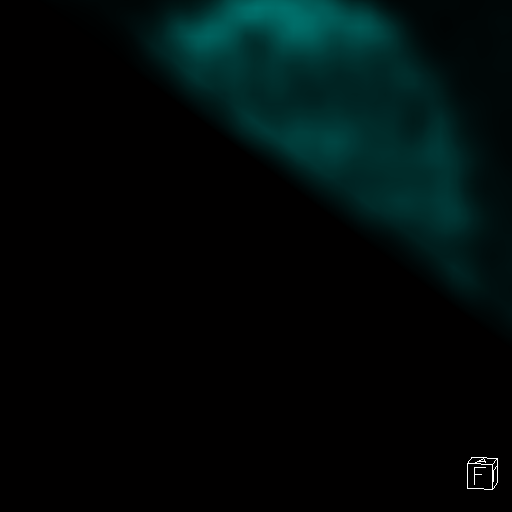
[im 39/45]
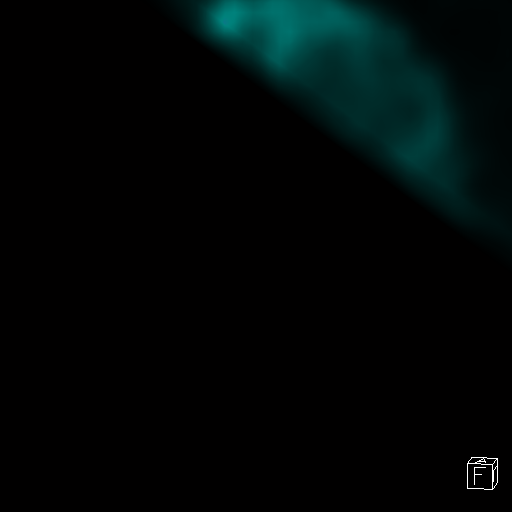
[im 43/45]
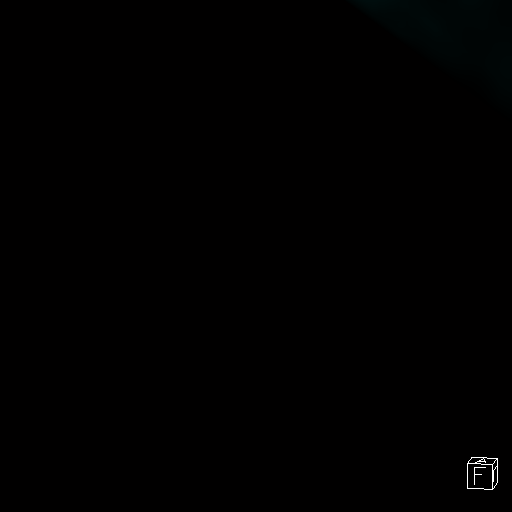
[im 45/45]
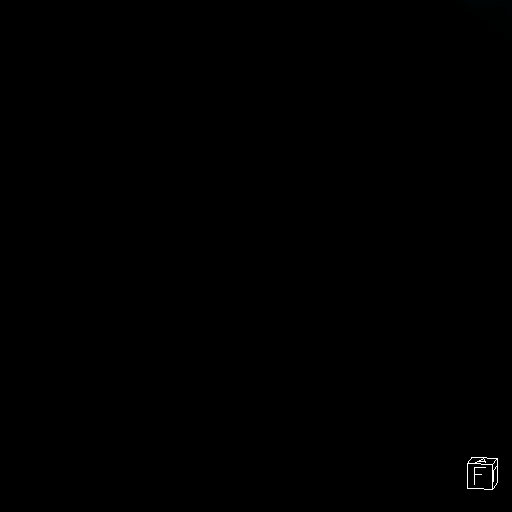

[24 of 30 positions shown; findings below may reference images not displayed]

FINDINGS: A subtle decreased radiotracer activity within the posterior LEFT
striatum (putamen). Mild relative decreased counts in the head of
the LEFT caudate nucleus compared to the RIGHT. The shape and signal
intensity within the RIGHT caudate nucleus appears normal.
IMPRESSION: Subtle asymmetric decreased radiotracer activity in the posterior
LEFT striatum. Findings could indicate early Parkinsonian asyndrome
pathology. Recommend clinical correlation.

Of note, DaTSCAN is not diagnostic of Parkinsonian syndromes, which
remains a clinical diagnosis. DaTscan is an adjuvant test to aid in
the clinical diagnosis of Parkinsonian syndromes.

## 2021-04-25 DIAGNOSIS — R262 Difficulty in walking, not elsewhere classified: Secondary | ICD-10-CM | POA: Diagnosis not present

## 2021-04-25 DIAGNOSIS — M6281 Muscle weakness (generalized): Secondary | ICD-10-CM | POA: Diagnosis not present

## 2021-04-25 DIAGNOSIS — R278 Other lack of coordination: Secondary | ICD-10-CM | POA: Diagnosis not present

## 2021-04-27 DIAGNOSIS — R262 Difficulty in walking, not elsewhere classified: Secondary | ICD-10-CM | POA: Diagnosis not present

## 2021-04-27 DIAGNOSIS — M6281 Muscle weakness (generalized): Secondary | ICD-10-CM | POA: Diagnosis not present

## 2021-04-27 DIAGNOSIS — R278 Other lack of coordination: Secondary | ICD-10-CM | POA: Diagnosis not present

## 2021-04-30 DIAGNOSIS — M6281 Muscle weakness (generalized): Secondary | ICD-10-CM | POA: Diagnosis not present

## 2021-04-30 DIAGNOSIS — R262 Difficulty in walking, not elsewhere classified: Secondary | ICD-10-CM | POA: Diagnosis not present

## 2021-04-30 DIAGNOSIS — R278 Other lack of coordination: Secondary | ICD-10-CM | POA: Diagnosis not present

## 2021-05-02 DIAGNOSIS — M6281 Muscle weakness (generalized): Secondary | ICD-10-CM | POA: Diagnosis not present

## 2021-05-02 DIAGNOSIS — R262 Difficulty in walking, not elsewhere classified: Secondary | ICD-10-CM | POA: Diagnosis not present

## 2021-05-02 DIAGNOSIS — R278 Other lack of coordination: Secondary | ICD-10-CM | POA: Diagnosis not present

## 2021-05-07 DIAGNOSIS — M6281 Muscle weakness (generalized): Secondary | ICD-10-CM | POA: Diagnosis not present

## 2021-05-07 DIAGNOSIS — R278 Other lack of coordination: Secondary | ICD-10-CM | POA: Diagnosis not present

## 2021-05-07 DIAGNOSIS — R262 Difficulty in walking, not elsewhere classified: Secondary | ICD-10-CM | POA: Diagnosis not present

## 2021-05-09 DIAGNOSIS — R262 Difficulty in walking, not elsewhere classified: Secondary | ICD-10-CM | POA: Diagnosis not present

## 2021-05-09 DIAGNOSIS — M6281 Muscle weakness (generalized): Secondary | ICD-10-CM | POA: Diagnosis not present

## 2021-05-09 DIAGNOSIS — R278 Other lack of coordination: Secondary | ICD-10-CM | POA: Diagnosis not present

## 2021-05-11 DIAGNOSIS — R278 Other lack of coordination: Secondary | ICD-10-CM | POA: Diagnosis not present

## 2021-05-11 DIAGNOSIS — R262 Difficulty in walking, not elsewhere classified: Secondary | ICD-10-CM | POA: Diagnosis not present

## 2021-05-11 DIAGNOSIS — M6281 Muscle weakness (generalized): Secondary | ICD-10-CM | POA: Diagnosis not present

## 2021-05-15 DIAGNOSIS — R262 Difficulty in walking, not elsewhere classified: Secondary | ICD-10-CM | POA: Diagnosis not present

## 2021-05-15 DIAGNOSIS — R278 Other lack of coordination: Secondary | ICD-10-CM | POA: Diagnosis not present

## 2021-05-15 DIAGNOSIS — M6281 Muscle weakness (generalized): Secondary | ICD-10-CM | POA: Diagnosis not present

## 2021-05-16 DIAGNOSIS — R262 Difficulty in walking, not elsewhere classified: Secondary | ICD-10-CM | POA: Diagnosis not present

## 2021-05-16 DIAGNOSIS — M6281 Muscle weakness (generalized): Secondary | ICD-10-CM | POA: Diagnosis not present

## 2021-05-16 DIAGNOSIS — R278 Other lack of coordination: Secondary | ICD-10-CM | POA: Diagnosis not present

## 2021-05-17 DIAGNOSIS — R262 Difficulty in walking, not elsewhere classified: Secondary | ICD-10-CM | POA: Diagnosis not present

## 2021-05-17 DIAGNOSIS — M6281 Muscle weakness (generalized): Secondary | ICD-10-CM | POA: Diagnosis not present

## 2021-05-17 DIAGNOSIS — R278 Other lack of coordination: Secondary | ICD-10-CM | POA: Diagnosis not present

## 2021-05-22 DIAGNOSIS — R262 Difficulty in walking, not elsewhere classified: Secondary | ICD-10-CM | POA: Diagnosis not present

## 2021-05-22 DIAGNOSIS — R278 Other lack of coordination: Secondary | ICD-10-CM | POA: Diagnosis not present

## 2021-05-22 DIAGNOSIS — Z20822 Contact with and (suspected) exposure to covid-19: Secondary | ICD-10-CM | POA: Diagnosis not present

## 2021-05-22 DIAGNOSIS — M6281 Muscle weakness (generalized): Secondary | ICD-10-CM | POA: Diagnosis not present

## 2021-05-24 DIAGNOSIS — R262 Difficulty in walking, not elsewhere classified: Secondary | ICD-10-CM | POA: Diagnosis not present

## 2021-05-24 DIAGNOSIS — M6281 Muscle weakness (generalized): Secondary | ICD-10-CM | POA: Diagnosis not present

## 2021-05-24 DIAGNOSIS — R278 Other lack of coordination: Secondary | ICD-10-CM | POA: Diagnosis not present

## 2021-05-29 DIAGNOSIS — M6281 Muscle weakness (generalized): Secondary | ICD-10-CM | POA: Diagnosis not present

## 2021-05-29 DIAGNOSIS — R262 Difficulty in walking, not elsewhere classified: Secondary | ICD-10-CM | POA: Diagnosis not present

## 2021-05-29 DIAGNOSIS — Z20822 Contact with and (suspected) exposure to covid-19: Secondary | ICD-10-CM | POA: Diagnosis not present

## 2021-05-29 DIAGNOSIS — R278 Other lack of coordination: Secondary | ICD-10-CM | POA: Diagnosis not present

## 2021-05-31 DIAGNOSIS — R278 Other lack of coordination: Secondary | ICD-10-CM | POA: Diagnosis not present

## 2021-05-31 DIAGNOSIS — M6281 Muscle weakness (generalized): Secondary | ICD-10-CM | POA: Diagnosis not present

## 2021-05-31 DIAGNOSIS — R262 Difficulty in walking, not elsewhere classified: Secondary | ICD-10-CM | POA: Diagnosis not present

## 2021-06-05 DIAGNOSIS — R278 Other lack of coordination: Secondary | ICD-10-CM | POA: Diagnosis not present

## 2021-06-05 DIAGNOSIS — R262 Difficulty in walking, not elsewhere classified: Secondary | ICD-10-CM | POA: Diagnosis not present

## 2021-06-05 DIAGNOSIS — M6281 Muscle weakness (generalized): Secondary | ICD-10-CM | POA: Diagnosis not present

## 2021-06-05 DIAGNOSIS — Z20822 Contact with and (suspected) exposure to covid-19: Secondary | ICD-10-CM | POA: Diagnosis not present

## 2021-06-07 DIAGNOSIS — R262 Difficulty in walking, not elsewhere classified: Secondary | ICD-10-CM | POA: Diagnosis not present

## 2021-06-07 DIAGNOSIS — R278 Other lack of coordination: Secondary | ICD-10-CM | POA: Diagnosis not present

## 2021-06-07 DIAGNOSIS — Z20822 Contact with and (suspected) exposure to covid-19: Secondary | ICD-10-CM | POA: Diagnosis not present

## 2021-06-07 DIAGNOSIS — M6281 Muscle weakness (generalized): Secondary | ICD-10-CM | POA: Diagnosis not present

## 2021-06-12 DIAGNOSIS — R278 Other lack of coordination: Secondary | ICD-10-CM | POA: Diagnosis not present

## 2021-06-12 DIAGNOSIS — R262 Difficulty in walking, not elsewhere classified: Secondary | ICD-10-CM | POA: Diagnosis not present

## 2021-06-12 DIAGNOSIS — E1169 Type 2 diabetes mellitus with other specified complication: Secondary | ICD-10-CM | POA: Diagnosis not present

## 2021-06-12 DIAGNOSIS — N1831 Chronic kidney disease, stage 3a: Secondary | ICD-10-CM | POA: Diagnosis not present

## 2021-06-12 DIAGNOSIS — E440 Moderate protein-calorie malnutrition: Secondary | ICD-10-CM | POA: Diagnosis not present

## 2021-06-12 DIAGNOSIS — M6281 Muscle weakness (generalized): Secondary | ICD-10-CM | POA: Diagnosis not present

## 2021-06-12 DIAGNOSIS — E782 Mixed hyperlipidemia: Secondary | ICD-10-CM | POA: Diagnosis not present

## 2021-06-14 DIAGNOSIS — M6281 Muscle weakness (generalized): Secondary | ICD-10-CM | POA: Diagnosis not present

## 2021-06-14 DIAGNOSIS — R262 Difficulty in walking, not elsewhere classified: Secondary | ICD-10-CM | POA: Diagnosis not present

## 2021-06-14 DIAGNOSIS — R278 Other lack of coordination: Secondary | ICD-10-CM | POA: Diagnosis not present

## 2021-06-18 DIAGNOSIS — R262 Difficulty in walking, not elsewhere classified: Secondary | ICD-10-CM | POA: Diagnosis not present

## 2021-06-18 DIAGNOSIS — R278 Other lack of coordination: Secondary | ICD-10-CM | POA: Diagnosis not present

## 2021-06-18 DIAGNOSIS — M6281 Muscle weakness (generalized): Secondary | ICD-10-CM | POA: Diagnosis not present

## 2021-06-19 DIAGNOSIS — E1165 Type 2 diabetes mellitus with hyperglycemia: Secondary | ICD-10-CM | POA: Diagnosis not present

## 2021-06-19 DIAGNOSIS — M6281 Muscle weakness (generalized): Secondary | ICD-10-CM | POA: Diagnosis not present

## 2021-06-19 DIAGNOSIS — N184 Chronic kidney disease, stage 4 (severe): Secondary | ICD-10-CM | POA: Diagnosis not present

## 2021-06-19 DIAGNOSIS — R262 Difficulty in walking, not elsewhere classified: Secondary | ICD-10-CM | POA: Diagnosis not present

## 2021-06-19 DIAGNOSIS — Z23 Encounter for immunization: Secondary | ICD-10-CM | POA: Diagnosis not present

## 2021-06-19 DIAGNOSIS — R278 Other lack of coordination: Secondary | ICD-10-CM | POA: Diagnosis not present

## 2021-06-19 DIAGNOSIS — F0390 Unspecified dementia without behavioral disturbance: Secondary | ICD-10-CM | POA: Diagnosis not present

## 2021-06-20 DIAGNOSIS — R112 Nausea with vomiting, unspecified: Secondary | ICD-10-CM | POA: Diagnosis not present

## 2021-06-20 DIAGNOSIS — K219 Gastro-esophageal reflux disease without esophagitis: Secondary | ICD-10-CM | POA: Diagnosis not present

## 2021-06-20 DIAGNOSIS — R634 Abnormal weight loss: Secondary | ICD-10-CM | POA: Diagnosis not present

## 2021-06-22 ENCOUNTER — Telehealth: Payer: Self-pay

## 2021-06-22 NOTE — Telephone Encounter (Signed)
(  9:30 am ) PC SW left a message for patient  requesting a call back.

## 2021-06-25 ENCOUNTER — Other Ambulatory Visit: Payer: Medicare PPO

## 2021-06-25 ENCOUNTER — Other Ambulatory Visit: Payer: Self-pay

## 2021-06-25 DIAGNOSIS — Z515 Encounter for palliative care: Secondary | ICD-10-CM

## 2021-06-25 NOTE — Progress Notes (Signed)
COMMUNITY PALLIATIVE CARE SW NOTE  PATIENT NAME: [name] DOB: 12-Feb-1951 MRN: [id number]  PRIMARY CARE PROVIDER: [name], MD  RESPONSIBLE PARTY:  Acct ID - Guarantor Home Phone Work Phone Relationship Acct Type  [id number] - Che[place],J* [phone]  Self P/F     [address], [place], [place] [address]   Due to the COVID-19 crisis, this virtual check-in visit was done via telephone from my office and it was initiated and consent by this patient and or family.  SOCIAL WORK TELEPHONIC ENCOUNTER (4:40 pm-4:20 pm)  PC SW completed a telephonic check-in with patient's wife. Patient was also present with his wife. His wife reported that patient has a new diagnosis of Stage 4 kidney disease. He is waiting for a call from nephrologist and he is scheduled to his surgeon. Patient is scheduled to have  labs don on 2/6 and his appointment is with the surgeon on 2/13. Patient's wife report that things were going well at the facility. Although they may consider assisted living later, but right now feels they are at home. No other concerns noted.    [address] [place], 

## 2021-06-26 DIAGNOSIS — Z20822 Contact with and (suspected) exposure to covid-19: Secondary | ICD-10-CM | POA: Diagnosis not present

## 2021-07-02 DIAGNOSIS — C641 Malignant neoplasm of right kidney, except renal pelvis: Secondary | ICD-10-CM | POA: Diagnosis not present

## 2021-07-05 DIAGNOSIS — K2100 Gastro-esophageal reflux disease with esophagitis, without bleeding: Secondary | ICD-10-CM | POA: Diagnosis not present

## 2021-07-05 DIAGNOSIS — R112 Nausea with vomiting, unspecified: Secondary | ICD-10-CM | POA: Diagnosis not present

## 2021-07-05 DIAGNOSIS — Z9889 Other specified postprocedural states: Secondary | ICD-10-CM | POA: Diagnosis not present

## 2021-07-06 DIAGNOSIS — M6281 Muscle weakness (generalized): Secondary | ICD-10-CM | POA: Diagnosis not present

## 2021-07-06 DIAGNOSIS — R278 Other lack of coordination: Secondary | ICD-10-CM | POA: Diagnosis not present

## 2021-07-06 DIAGNOSIS — R262 Difficulty in walking, not elsewhere classified: Secondary | ICD-10-CM | POA: Diagnosis not present

## 2021-07-09 DIAGNOSIS — R278 Other lack of coordination: Secondary | ICD-10-CM | POA: Diagnosis not present

## 2021-07-09 DIAGNOSIS — M6281 Muscle weakness (generalized): Secondary | ICD-10-CM | POA: Diagnosis not present

## 2021-07-09 DIAGNOSIS — Z20822 Contact with and (suspected) exposure to covid-19: Secondary | ICD-10-CM | POA: Diagnosis not present

## 2021-07-09 DIAGNOSIS — R262 Difficulty in walking, not elsewhere classified: Secondary | ICD-10-CM | POA: Diagnosis not present

## 2021-07-10 DIAGNOSIS — N184 Chronic kidney disease, stage 4 (severe): Secondary | ICD-10-CM | POA: Diagnosis not present

## 2021-07-10 DIAGNOSIS — N2889 Other specified disorders of kidney and ureter: Secondary | ICD-10-CM | POA: Diagnosis not present

## 2021-07-10 DIAGNOSIS — D631 Anemia in chronic kidney disease: Secondary | ICD-10-CM | POA: Diagnosis not present

## 2021-07-10 DIAGNOSIS — N2581 Secondary hyperparathyroidism of renal origin: Secondary | ICD-10-CM | POA: Diagnosis not present

## 2021-07-10 DIAGNOSIS — I129 Hypertensive chronic kidney disease with stage 1 through stage 4 chronic kidney disease, or unspecified chronic kidney disease: Secondary | ICD-10-CM | POA: Diagnosis not present

## 2021-07-10 DIAGNOSIS — Z905 Acquired absence of kidney: Secondary | ICD-10-CM | POA: Diagnosis not present

## 2021-07-11 ENCOUNTER — Ambulatory Visit (HOSPITAL_COMMUNITY)
Admission: RE | Admit: 2021-07-11 | Discharge: 2021-07-11 | Disposition: A | Discharge status: Home / Self Care | Payer: Medicare PPO | Source: Ambulatory Visit | Attending: Urology | Admitting: Urology

## 2021-07-11 ENCOUNTER — Other Ambulatory Visit: Payer: Self-pay

## 2021-07-11 ENCOUNTER — Other Ambulatory Visit (HOSPITAL_COMMUNITY): Payer: Self-pay | Admitting: Urology

## 2021-07-11 DIAGNOSIS — C642 Malignant neoplasm of left kidney, except renal pelvis: Secondary | ICD-10-CM | POA: Diagnosis not present

## 2021-07-11 DIAGNOSIS — I10 Essential (primary) hypertension: Secondary | ICD-10-CM | POA: Diagnosis not present

## 2021-07-11 DIAGNOSIS — I700 Atherosclerosis of aorta: Secondary | ICD-10-CM | POA: Diagnosis not present

## 2021-07-11 DIAGNOSIS — C641 Malignant neoplasm of right kidney, except renal pelvis: Secondary | ICD-10-CM | POA: Diagnosis not present

## 2021-07-13 DIAGNOSIS — R278 Other lack of coordination: Secondary | ICD-10-CM | POA: Diagnosis not present

## 2021-07-13 DIAGNOSIS — M6281 Muscle weakness (generalized): Secondary | ICD-10-CM | POA: Diagnosis not present

## 2021-07-13 DIAGNOSIS — R262 Difficulty in walking, not elsewhere classified: Secondary | ICD-10-CM | POA: Diagnosis not present

## 2021-07-17 DIAGNOSIS — M6281 Muscle weakness (generalized): Secondary | ICD-10-CM | POA: Diagnosis not present

## 2021-07-17 DIAGNOSIS — R278 Other lack of coordination: Secondary | ICD-10-CM | POA: Diagnosis not present

## 2021-07-17 DIAGNOSIS — R262 Difficulty in walking, not elsewhere classified: Secondary | ICD-10-CM | POA: Diagnosis not present

## 2021-07-19 DIAGNOSIS — M6281 Muscle weakness (generalized): Secondary | ICD-10-CM | POA: Diagnosis not present

## 2021-07-19 DIAGNOSIS — R278 Other lack of coordination: Secondary | ICD-10-CM | POA: Diagnosis not present

## 2021-07-19 DIAGNOSIS — R262 Difficulty in walking, not elsewhere classified: Secondary | ICD-10-CM | POA: Diagnosis not present

## 2021-07-20 DIAGNOSIS — E119 Type 2 diabetes mellitus without complications: Secondary | ICD-10-CM | POA: Diagnosis not present

## 2021-07-20 DIAGNOSIS — B351 Tinea unguium: Secondary | ICD-10-CM | POA: Diagnosis not present

## 2021-07-20 DIAGNOSIS — M2011 Hallux valgus (acquired), right foot: Secondary | ICD-10-CM | POA: Diagnosis not present

## 2021-07-20 DIAGNOSIS — M79674 Pain in right toe(s): Secondary | ICD-10-CM | POA: Diagnosis not present

## 2021-07-22 DIAGNOSIS — R262 Difficulty in walking, not elsewhere classified: Secondary | ICD-10-CM | POA: Diagnosis not present

## 2021-07-22 DIAGNOSIS — R278 Other lack of coordination: Secondary | ICD-10-CM | POA: Diagnosis not present

## 2021-07-22 DIAGNOSIS — M6281 Muscle weakness (generalized): Secondary | ICD-10-CM | POA: Diagnosis not present

## 2021-07-24 DIAGNOSIS — R278 Other lack of coordination: Secondary | ICD-10-CM | POA: Diagnosis not present

## 2021-07-24 DIAGNOSIS — R262 Difficulty in walking, not elsewhere classified: Secondary | ICD-10-CM | POA: Diagnosis not present

## 2021-07-24 DIAGNOSIS — M6281 Muscle weakness (generalized): Secondary | ICD-10-CM | POA: Diagnosis not present

## 2021-08-06 DIAGNOSIS — Z20822 Contact with and (suspected) exposure to covid-19: Secondary | ICD-10-CM | POA: Diagnosis not present

## 2021-08-07 DIAGNOSIS — F0390 Unspecified dementia without behavioral disturbance: Secondary | ICD-10-CM | POA: Diagnosis not present

## 2021-08-07 DIAGNOSIS — E1165 Type 2 diabetes mellitus with hyperglycemia: Secondary | ICD-10-CM | POA: Diagnosis not present

## 2021-08-07 DIAGNOSIS — N184 Chronic kidney disease, stage 4 (severe): Secondary | ICD-10-CM | POA: Diagnosis not present

## 2021-08-08 DIAGNOSIS — R278 Other lack of coordination: Secondary | ICD-10-CM | POA: Diagnosis not present

## 2021-08-08 DIAGNOSIS — R262 Difficulty in walking, not elsewhere classified: Secondary | ICD-10-CM | POA: Diagnosis not present

## 2021-08-08 DIAGNOSIS — M6281 Muscle weakness (generalized): Secondary | ICD-10-CM | POA: Diagnosis not present

## 2021-08-10 DIAGNOSIS — N184 Chronic kidney disease, stage 4 (severe): Secondary | ICD-10-CM | POA: Diagnosis not present

## 2021-08-10 DIAGNOSIS — F0390 Unspecified dementia without behavioral disturbance: Secondary | ICD-10-CM | POA: Diagnosis not present

## 2021-08-10 DIAGNOSIS — I10 Essential (primary) hypertension: Secondary | ICD-10-CM | POA: Diagnosis not present

## 2021-08-10 DIAGNOSIS — E1169 Type 2 diabetes mellitus with other specified complication: Secondary | ICD-10-CM | POA: Diagnosis not present

## 2021-08-13 DIAGNOSIS — Z20822 Contact with and (suspected) exposure to covid-19: Secondary | ICD-10-CM | POA: Diagnosis not present

## 2021-08-16 DIAGNOSIS — R278 Other lack of coordination: Secondary | ICD-10-CM | POA: Diagnosis not present

## 2021-08-16 DIAGNOSIS — M6281 Muscle weakness (generalized): Secondary | ICD-10-CM | POA: Diagnosis not present

## 2021-08-16 DIAGNOSIS — R262 Difficulty in walking, not elsewhere classified: Secondary | ICD-10-CM | POA: Diagnosis not present

## 2021-08-20 DIAGNOSIS — Z20822 Contact with and (suspected) exposure to covid-19: Secondary | ICD-10-CM | POA: Diagnosis not present

## 2021-08-27 DIAGNOSIS — Z20822 Contact with and (suspected) exposure to covid-19: Secondary | ICD-10-CM | POA: Diagnosis not present

## 2021-08-27 DIAGNOSIS — N184 Chronic kidney disease, stage 4 (severe): Secondary | ICD-10-CM | POA: Diagnosis not present

## 2021-09-03 DIAGNOSIS — N2581 Secondary hyperparathyroidism of renal origin: Secondary | ICD-10-CM | POA: Diagnosis not present

## 2021-09-03 DIAGNOSIS — N2889 Other specified disorders of kidney and ureter: Secondary | ICD-10-CM | POA: Diagnosis not present

## 2021-09-03 DIAGNOSIS — D631 Anemia in chronic kidney disease: Secondary | ICD-10-CM | POA: Diagnosis not present

## 2021-09-03 DIAGNOSIS — Z905 Acquired absence of kidney: Secondary | ICD-10-CM | POA: Diagnosis not present

## 2021-09-03 DIAGNOSIS — Z20822 Contact with and (suspected) exposure to covid-19: Secondary | ICD-10-CM | POA: Diagnosis not present

## 2021-09-03 DIAGNOSIS — N184 Chronic kidney disease, stage 4 (severe): Secondary | ICD-10-CM | POA: Diagnosis not present

## 2021-09-03 DIAGNOSIS — E875 Hyperkalemia: Secondary | ICD-10-CM | POA: Diagnosis not present

## 2021-09-03 DIAGNOSIS — I129 Hypertensive chronic kidney disease with stage 1 through stage 4 chronic kidney disease, or unspecified chronic kidney disease: Secondary | ICD-10-CM | POA: Diagnosis not present

## 2021-09-17 DIAGNOSIS — Z20822 Contact with and (suspected) exposure to covid-19: Secondary | ICD-10-CM | POA: Diagnosis not present

## 2021-09-24 DIAGNOSIS — Z20822 Contact with and (suspected) exposure to covid-19: Secondary | ICD-10-CM | POA: Diagnosis not present

## 2021-09-28 DIAGNOSIS — E1169 Type 2 diabetes mellitus with other specified complication: Secondary | ICD-10-CM | POA: Diagnosis not present

## 2021-09-28 DIAGNOSIS — N184 Chronic kidney disease, stage 4 (severe): Secondary | ICD-10-CM | POA: Diagnosis not present

## 2021-10-05 DIAGNOSIS — G912 (Idiopathic) normal pressure hydrocephalus: Secondary | ICD-10-CM | POA: Diagnosis not present

## 2021-10-05 DIAGNOSIS — E1165 Type 2 diabetes mellitus with hyperglycemia: Secondary | ICD-10-CM | POA: Diagnosis not present

## 2021-10-05 DIAGNOSIS — I5189 Other ill-defined heart diseases: Secondary | ICD-10-CM | POA: Diagnosis not present

## 2021-10-05 DIAGNOSIS — F0390 Unspecified dementia without behavioral disturbance: Secondary | ICD-10-CM | POA: Diagnosis not present

## 2021-10-17 DIAGNOSIS — N189 Chronic kidney disease, unspecified: Secondary | ICD-10-CM | POA: Diagnosis not present

## 2021-10-17 DIAGNOSIS — D509 Iron deficiency anemia, unspecified: Secondary | ICD-10-CM | POA: Diagnosis not present

## 2021-10-26 DIAGNOSIS — N189 Chronic kidney disease, unspecified: Secondary | ICD-10-CM | POA: Diagnosis not present

## 2021-10-26 DIAGNOSIS — D509 Iron deficiency anemia, unspecified: Secondary | ICD-10-CM | POA: Diagnosis not present

## 2021-10-29 DIAGNOSIS — N184 Chronic kidney disease, stage 4 (severe): Secondary | ICD-10-CM | POA: Diagnosis not present

## 2021-11-05 DIAGNOSIS — D631 Anemia in chronic kidney disease: Secondary | ICD-10-CM | POA: Diagnosis not present

## 2021-11-05 DIAGNOSIS — N184 Chronic kidney disease, stage 4 (severe): Secondary | ICD-10-CM | POA: Diagnosis not present

## 2021-11-05 DIAGNOSIS — I129 Hypertensive chronic kidney disease with stage 1 through stage 4 chronic kidney disease, or unspecified chronic kidney disease: Secondary | ICD-10-CM | POA: Diagnosis not present

## 2021-11-05 DIAGNOSIS — Z905 Acquired absence of kidney: Secondary | ICD-10-CM | POA: Diagnosis not present

## 2021-11-05 DIAGNOSIS — E875 Hyperkalemia: Secondary | ICD-10-CM | POA: Diagnosis not present

## 2021-11-05 DIAGNOSIS — N2581 Secondary hyperparathyroidism of renal origin: Secondary | ICD-10-CM | POA: Diagnosis not present

## 2021-11-06 DIAGNOSIS — N189 Chronic kidney disease, unspecified: Secondary | ICD-10-CM | POA: Diagnosis not present

## 2021-11-06 DIAGNOSIS — D509 Iron deficiency anemia, unspecified: Secondary | ICD-10-CM | POA: Diagnosis not present

## 2021-11-09 ENCOUNTER — Emergency Department (HOSPITAL_COMMUNITY): Payer: Medicare PPO

## 2021-11-09 ENCOUNTER — Emergency Department (HOSPITAL_COMMUNITY)
Admission: EM | Admit: 2021-11-09 | Discharge: 2021-11-10 | Disposition: A | Discharge status: Home / Self Care | Payer: Medicare PPO | Attending: Emergency Medicine | Admitting: Emergency Medicine

## 2021-11-09 ENCOUNTER — Other Ambulatory Visit: Payer: Self-pay

## 2021-11-09 ENCOUNTER — Encounter (HOSPITAL_COMMUNITY): Payer: Self-pay

## 2021-11-09 DIAGNOSIS — S0083XA Contusion of other part of head, initial encounter: Secondary | ICD-10-CM | POA: Insufficient documentation

## 2021-11-09 DIAGNOSIS — W19XXXA Unspecified fall, initial encounter: Secondary | ICD-10-CM | POA: Diagnosis not present

## 2021-11-09 DIAGNOSIS — R6889 Other general symptoms and signs: Secondary | ICD-10-CM | POA: Diagnosis not present

## 2021-11-09 DIAGNOSIS — G319 Degenerative disease of nervous system, unspecified: Secondary | ICD-10-CM | POA: Diagnosis not present

## 2021-11-09 DIAGNOSIS — W010XXA Fall on same level from slipping, tripping and stumbling without subsequent striking against object, initial encounter: Secondary | ICD-10-CM | POA: Diagnosis not present

## 2021-11-09 DIAGNOSIS — Z7984 Long term (current) use of oral hypoglycemic drugs: Secondary | ICD-10-CM | POA: Insufficient documentation

## 2021-11-09 DIAGNOSIS — D181 Lymphangioma, any site: Secondary | ICD-10-CM | POA: Diagnosis not present

## 2021-11-09 DIAGNOSIS — Z23 Encounter for immunization: Secondary | ICD-10-CM | POA: Insufficient documentation

## 2021-11-09 DIAGNOSIS — G9608 Other cranial cerebrospinal fluid leak: Secondary | ICD-10-CM

## 2021-11-09 DIAGNOSIS — I10 Essential (primary) hypertension: Secondary | ICD-10-CM

## 2021-11-09 DIAGNOSIS — S0990XA Unspecified injury of head, initial encounter: Secondary | ICD-10-CM | POA: Diagnosis present

## 2021-11-09 DIAGNOSIS — Z79899 Other long term (current) drug therapy: Secondary | ICD-10-CM | POA: Insufficient documentation

## 2021-11-09 DIAGNOSIS — R030 Elevated blood-pressure reading, without diagnosis of hypertension: Secondary | ICD-10-CM

## 2021-11-09 DIAGNOSIS — S0081XA Abrasion of other part of head, initial encounter: Secondary | ICD-10-CM

## 2021-11-09 DIAGNOSIS — I6782 Cerebral ischemia: Secondary | ICD-10-CM | POA: Diagnosis not present

## 2021-11-09 MED ORDER — ACETAMINOPHEN 500 MG PO TABS
1000.0000 mg | ORAL_TABLET | Freq: Once | ORAL | Status: AC
  Administered 2021-11-09: 1000 mg via ORAL
  Filled 2021-11-09: qty 2

## 2021-11-09 MED ORDER — TETANUS-DIPHTH-ACELL PERTUSSIS 5-2.5-18.5 LF-MCG/0.5 IM SUSY
0.5000 mL | PREFILLED_SYRINGE | Freq: Once | INTRAMUSCULAR | Status: AC
  Administered 2021-11-09: 0.5 mL via INTRAMUSCULAR
  Filled 2021-11-09: qty 0.5

## 2021-11-09 NOTE — ED Notes (Signed)
Call received from pt friend Rafael Bihari [phone] requesting rtn call for pt status/updates. [employer]

## 2021-11-09 NOTE — ED Notes (Signed)
[facility] called for pt. Transportation to [facility]

## 2021-11-10 DIAGNOSIS — W19XXXA Unspecified fall, initial encounter: Secondary | ICD-10-CM | POA: Diagnosis not present

## 2021-11-10 DIAGNOSIS — Z743 Need for continuous supervision: Secondary | ICD-10-CM | POA: Diagnosis not present

## 2021-11-10 DIAGNOSIS — R2689 Other abnormalities of gait and mobility: Secondary | ICD-10-CM | POA: Diagnosis not present

## 2021-11-14 ENCOUNTER — Encounter (INDEPENDENT_AMBULATORY_CARE_PROVIDER_SITE_OTHER): Payer: Medicare PPO | Admitting: Ophthalmology

## 2021-11-14 ENCOUNTER — Encounter (INDEPENDENT_AMBULATORY_CARE_PROVIDER_SITE_OTHER): Payer: Self-pay | Admitting: Ophthalmology

## 2021-11-14 ENCOUNTER — Ambulatory Visit (INDEPENDENT_AMBULATORY_CARE_PROVIDER_SITE_OTHER): Payer: Medicare PPO | Admitting: Ophthalmology

## 2021-11-14 DIAGNOSIS — Z961 Presence of intraocular lens: Secondary | ICD-10-CM | POA: Diagnosis not present

## 2021-11-14 DIAGNOSIS — E113553 Type 2 diabetes mellitus with stable proliferative diabetic retinopathy, bilateral: Secondary | ICD-10-CM | POA: Diagnosis not present

## 2021-11-14 DIAGNOSIS — G9608 Other cranial cerebrospinal fluid leak: Secondary | ICD-10-CM | POA: Diagnosis not present

## 2021-11-14 DIAGNOSIS — G912 (Idiopathic) normal pressure hydrocephalus: Secondary | ICD-10-CM | POA: Diagnosis not present

## 2021-11-14 DIAGNOSIS — F0390 Unspecified dementia without behavioral disturbance: Secondary | ICD-10-CM | POA: Diagnosis not present

## 2021-11-14 NOTE — Assessment & Plan Note (Signed)

## 2021-11-14 NOTE — Assessment & Plan Note (Signed)
Looks great OU with good acuity

## 2021-11-14 NOTE — Progress Notes (Signed)
11/14/2021     CHIEF COMPLAINT Patient presents for  Chief Complaint  Patient presents with   Diabetic Eye Exam      HISTORY OF PRESENT ILLNESS: [name] is a 71 y.o. male who presents to the clinic today for:   HPI     Diabetic Eye Exam           Vision: is stable   Associated Symptoms: Negative for Flashes and Floaters   Diabetes Type: Type 2   Onset: years ago (Since the early 1980's)   Blood Sugars: is controlled         Comments   NP- DEE (seen previously). Patient was referred by Dr. Katy Fitch, patient has appointment with Dr. Katy Fitch in September. Patient reports "I think my vision is ok but I think I need new glasses. My eyes itch sometimes." LBS: this morning "107." A1C: unsure Patient is not taking any medications for Diabetes currently.       Last edited by Laurin Coder on 11/14/2021  9:56 AM.      Referring physician: Clent Jacks, MD [address] [place],  [place] [address]  HISTORICAL INFORMATION:   Selected notes from the MEDICAL RECORD NUMBER    Lab Results  Component Value Date   HGBA1C 7.1 (H) 03/09/2021     CURRENT MEDICATIONS: No current outpatient medications on file. (Ophthalmic Drugs)   No current facility-administered medications for this visit. (Ophthalmic Drugs)   Current Outpatient Medications (Other)  Medication Sig   acetaminophen (TYLENOL) 500 MG tablet Take 500-1,000 mg by mouth every 6 (six) hours as needed for moderate pain or headache.   amoxicillin-clavulanate (AUGMENTIN) 500-125 MG tablet Take 1 tablet by mouth 2 (two) times daily.   bismuth subsalicylate (PEPTO BISMOL) 262 MG/15ML suspension Take 30 mLs by mouth every 6 (six) hours as needed for diarrhea or loose stools or indigestion.   esomeprazole (NEXIUM) 20 MG capsule Take 40 mg by mouth daily.   Iron-FA-B Cmp-C-Biot-Probiotic (FUSION PLUS) CAPS Take 1 capsule by mouth daily.   losartan-hydrochlorothiazide (HYZAAR) 50-12.5 MG tablet Take 1  tablet by mouth daily.   metFORMIN (GLUCOPHAGE-XR) 500 MG 24 hr tablet Take 500 mg by mouth daily with supper.   Multiple Vitamin (MULTIVITAMIN WITH MINERALS) TABS tablet Take 1 tablet by mouth daily. Centrum   traMADol (ULTRAM) 50 MG tablet Take 1-2 tablets (50-100 mg total) by mouth every 6 (six) hours as needed for moderate pain.   No current facility-administered medications for this visit. (Other)      REVIEW OF SYSTEMS: ROS   Negative for: Constitutional, Gastrointestinal, Neurological, Skin, Genitourinary, Musculoskeletal, HENT, Endocrine, Cardiovascular, Eyes, Respiratory, Psychiatric, Allergic/Imm, Heme/Lymph Last edited by Hurman Horn, MD on 11/14/2021 10:48 AM.       ALLERGIES Allergies  Allergen Reactions   Duragen Isaiah Blakes Valerate] Other (See Comments)    unknown   Fentanyl Other (See Comments)    hallucinations   Morphine And Related Other (See Comments)    Hallucinations.    PAST MEDICAL HISTORY Past Medical History:  Diagnosis Date   Anemia    Barrett esophagus    Chronic kidney disease (CKD), stage III (moderate) (HCC)    Dementia ([place])    Depression    Diabetes mellitus ([place])    Esophageal cancer ([place])    Gastroparesis    GERD (gastroesophageal reflux disease)    History of MRSA infection    Hydrocephalus (HCC)    Hypertension  Parkinson's disease ([place])    Pneumonia    Sleep apnea    no longer uses a cpap   Past Surgical History:  Procedure Laterality Date   CYST ENTEROSTOMY Left 03/15/2021   Procedure: excision of sebaceous cyst of left lower abdomen;  Surgeon: Ardis Hughs, MD;  Location: [facility];  Service: Urology;  Laterality: Left;   LAPAROSCOPIC NEPHRECTOMY Left 03/15/2021   Procedure: LEFT LAPAROSCOPIC RADICAL NEPHRECTOMY WITH LYSIS OF ADHESIONS;  Surgeon: Ardis Hughs, MD;  Location: [facility];  Service: Urology;  Laterality: Left;   Left VATS, Left thoracoabdominal esophagogastrectomy with jejunostomy and pyloroplasty      04/24/2004 Dr Arlyce Dice   LUMBAR PUNCTURE  02/15/2020   PLACEMENT OF LUMBAR DRAIN N/A 08/14/2020   Procedure: PLACEMENT OF LUMBAR DRAIN;  Surgeon: Judith Part, MD;  Location: [facility];  Service: Neurosurgery;  Laterality: N/A;   R cataract and retinal tears     SHUNT REPLACEMENT N/A 09/11/2020   Procedure: Placement of Lumboperitoneal Shunt;  Surgeon: Judith Part, MD;  Location: [facility];  Service: Neurosurgery;  Laterality: N/A;    FAMILY HISTORY Family History  Problem Relation Age of Onset   Cancer Mother        colon, breast   Heart disease Father    Diabetes Other        FX HX   Stroke Paternal Aunt    Dementia Neg Hx    Parkinson's disease Neg Hx     SOCIAL HISTORY Social History   Tobacco Use   Smoking status: Never   Smokeless tobacco: Never  Vaping Use   Vaping Use: Never used  Substance Use Topics   Alcohol use: Not Currently    Comment: occasionally drinks Bailey's   Drug use: No         OPHTHALMIC EXAM:  Base Eye Exam     Visual Acuity (ETDRS)       Right Left   Dist [place] 20/40 20/25 -1+1   Dist ph [place] 20/30 -2          Tonometry (Tonopen, 9:59 AM)       Right Left   Pressure 12 6         Pupils       Pupils Dark Light APD   Right PERRL 4 3 None   Left PERRL 4 3 None         Visual Fields (Counting fingers)       Left Right    Full Full         Extraocular Movement       Right Left    Full Full         Neuro/Psych     Oriented x3: Yes   Mood/Affect: Normal         Dilation     Both eyes: 1.0% Mydriacyl, 2.5% Phenylephrine @ 9:59 AM           Slit Lamp and Fundus Exam     External Exam       Right Left   External Normal Normal         Slit Lamp Exam       Right Left   Lids/Lashes Normal Normal   Conjunctiva/Sclera White and quiet White and quiet   Cornea Clear Clear   Anterior Chamber Deep and quiet Deep and quiet   Iris Round and reactive Round and reactive   Lens Centered  posterior chamber intraocular lens Centered posterior chamber  intraocular lens   Anterior Vitreous Normal Normal         Fundus Exam       Right Left   Posterior Vitreous Clear media Clear media   Disc Normal Normal   C/D Ratio 0.35 0.35   Macula Microaneurysms, no clinically significant macular edema Microaneurysms, no clinically significant macular edema   Vessels PDR-quiet PDR-quiet   Periphery Good PRP Good PRP            IMAGING AND PROCEDURES  Imaging and Procedures for 11/14/21  Color Fundus Photography Optos - OU - Both Eyes       Right Eye Progression has been stable. Disc findings include normal observations. Macula : microaneurysms.   Left Eye Progression has been stable. Macula : microaneurysms.   Notes .PDR OU quiescent, no active maculopathy  Good PRP peripherally OU      OCT, Retina - OU - Both Eyes       Right Eye Quality was good. Scan locations included subfoveal. Central Foveal Thickness: 269. Progression has no prior data. Findings include abnormal foveal contour.   Left Eye Quality was good. Scan locations included subfoveal. Central Foveal Thickness: 241. Progression has no prior data. Findings include abnormal foveal contour.   Notes OU with no active CSME.  Prior atrophy from extensive diabetic maculopathy now resolved and inactive.              ASSESSMENT/PLAN:  Controlled type 2 diabetes mellitus with stable proliferative retinopathy of both eyes, without long-term current use of insulin (HCC) The nature of regressed proliferative diabetic retinopathy was discussed with the patient. The patient was advised to maintain good glucose, blood pressure, monitor kidney function and serum lipid control as advised by personal physician. Rare risk for reactivation of progression exist with untreated severe anemia, untreated renal failure, untreated heart failure, and smoking. Complete avoidance of smoking was recommended. The chance of  recurrent proliferative diabetic retinopathy was discussed as well as the chance of vitreous hemorrhage for which further treatments may be necessary.   Explained to the patient that the quiescent  proliferative diabetic retinopathy disease is unlikely to ever worsen.  Worsening factors would include however severe anemia, hypertension out-of-control or impending renal failure.  Pseudophakia, both eyes Looks great OU with good acuity     ICD-10-CM   1. Controlled type 2 diabetes mellitus with stable proliferative retinopathy of both eyes, without long-term current use of insulin (HCC)  H96.2229 Color Fundus Photography Optos - OU - Both Eyes    OCT, Retina - OU - Both Eyes    2. Pseudophakia, both eyes  Z96.1       1.  Quiescent PDR OU.  Active maculopathy.  No active vasculopathy.  Continue observe  2.  Follow-up here 1 year alternating every 6 months with Dr. Katy Fitch ,so 1 visit with Dr. Katy Fitch annually and 1 here  3.  Ophthalmic Meds Ordered this visit:  No orders of the defined types were placed in this encounter.      Return in about 1 year (around 11/15/2022) for DILATE OU, COLOR FP, OCT.  There are no Patient Instructions on file for this visit.   Explained the diagnoses, plan, and follow up with the patient and they expressed understanding.  Patient expressed understanding of the importance of proper follow up care.   Clent Demark Jasper Hanf M.D. Diseases & Surgery of the Retina and Vitreous Retina & Diabetic [employer] 11/14/21     Abbreviations: M myopia (nearsighted);  A astigmatism; H hyperopia (farsighted); P presbyopia; Mrx spectacle prescription;  CTL contact lenses; OD right eye; OS left eye; OU both eyes  XT exotropia; ET esotropia; PEK punctate epithelial keratitis; PEE punctate epithelial erosions; DES dry eye syndrome; MGD meibomian gland dysfunction; ATs artificial tears; PFAT's preservative free artificial tears; [place] nuclear sclerotic cataract; PSC posterior  subcapsular cataract; ERM epi-retinal membrane; PVD posterior vitreous detachment; RD retinal detachment; DM diabetes mellitus; DR diabetic retinopathy; NPDR non-proliferative diabetic retinopathy; PDR proliferative diabetic retinopathy; CSME clinically significant macular edema; DME diabetic macular edema; dbh dot blot hemorrhages; CWS cotton wool spot; POAG primary open angle glaucoma; C/D cup-to-disc ratio; HVF humphrey visual field; GVF goldmann visual field; OCT optical coherence tomography; IOP intraocular pressure; BRVO Branch retinal vein occlusion; CRVO central retinal vein occlusion; CRAO central retinal artery occlusion; BRAO branch retinal artery occlusion; RT retinal tear; SB scleral buckle; PPV pars plana vitrectomy; VH Vitreous hemorrhage; PRP panretinal laser photocoagulation; IVK intravitreal kenalog; VMT vitreomacular traction; MH Macular hole;  NVD neovascularization of the disc; NVE neovascularization elsewhere; AREDS age related eye disease study; ARMD age related macular degeneration; POAG primary open angle glaucoma; EBMD epithelial/anterior basement membrane dystrophy; ACIOL anterior chamber intraocular lens; IOL intraocular lens; PCIOL posterior chamber intraocular lens; Phaco/IOL phacoemulsification with intraocular lens placement; [place] photorefractive keratectomy; LASIK laser assisted in situ keratomileusis; HTN hypertension; DM diabetes mellitus; COPD chronic obstructive pulmonary disease

## 2021-11-15 ENCOUNTER — Other Ambulatory Visit: Payer: Self-pay | Admitting: Internal Medicine

## 2021-11-15 DIAGNOSIS — F0390 Unspecified dementia without behavioral disturbance: Secondary | ICD-10-CM

## 2021-11-15 DIAGNOSIS — G9608 Other cranial cerebrospinal fluid leak: Secondary | ICD-10-CM

## 2021-11-16 DIAGNOSIS — D509 Iron deficiency anemia, unspecified: Secondary | ICD-10-CM | POA: Diagnosis not present

## 2021-11-16 DIAGNOSIS — N189 Chronic kidney disease, unspecified: Secondary | ICD-10-CM | POA: Diagnosis not present

## 2021-11-22 DIAGNOSIS — N189 Chronic kidney disease, unspecified: Secondary | ICD-10-CM | POA: Diagnosis not present

## 2021-11-22 DIAGNOSIS — D509 Iron deficiency anemia, unspecified: Secondary | ICD-10-CM | POA: Diagnosis not present

## 2021-11-23 ENCOUNTER — Ambulatory Visit
Admission: RE | Admit: 2021-11-23 | Discharge: 2021-11-23 | Disposition: A | Discharge status: Home / Self Care | Payer: Medicare PPO | Source: Ambulatory Visit | Attending: Internal Medicine | Admitting: Internal Medicine

## 2021-11-23 DIAGNOSIS — S065X0A Traumatic subdural hemorrhage without loss of consciousness, initial encounter: Secondary | ICD-10-CM | POA: Diagnosis not present

## 2021-11-23 DIAGNOSIS — F0390 Unspecified dementia without behavioral disturbance: Secondary | ICD-10-CM

## 2021-11-23 DIAGNOSIS — G9608 Other cranial cerebrospinal fluid leak: Secondary | ICD-10-CM

## 2021-11-28 ENCOUNTER — Ambulatory Visit: Payer: Medicare PPO | Admitting: Adult Health

## 2021-11-28 ENCOUNTER — Encounter: Payer: Self-pay | Admitting: Adult Health

## 2021-11-28 VITALS — BP 121/73 | HR 89 | Ht 77.0 in | Wt 184.6 lb

## 2021-11-28 DIAGNOSIS — G912 (Idiopathic) normal pressure hydrocephalus: Secondary | ICD-10-CM

## 2021-11-28 NOTE — Progress Notes (Signed)
PATIENT: [name] DOB: Jun 03, 1950  REASON FOR VISIT: follow up HISTORY FROM: patient PRIMARY NEUROLOGIST: Dr. Jaynee Eagles   Chief Complaint  Patient presents with   Follow-up    Rm 19,  NPH.  Doing ok, no vision, or headaches.       HISTORY OF PRESENT ILLNESS: Today 11/28/21: [name] is a 71 year old male with a history of possible normal pressure hydrocephalus.  He returns today for follow-up. Diagnosed with renal Carcinoma had surgery for removal. No chemo or radiation. Feels that he is doing much better.   Uses a cane or rollator. Getting iron infusions and feels that has helped. Did have a fall 2 weeks ago and no inuries. No headaches. No visual changes. Feels that his memory is improving. Wife helps with medications. Able to complete all ADLS independently.   11/23/2020 [name] is a 71 year old male with a history of possible normal pressure hydrocephalus.  He returns today for follow-up.  He was sent to Dr. Venetia Constable for lumbar drain.  Patient's wife reports that after the placement of the drain she did notice some improvement but over time she feels that he has gradually worsened again.  She notes that he has lost approximately 20 pounds with weight.  She states that he does not want to eat.  She states that he has no desire to go and do things.  Stays at home most of the time.  The patient feels that his memory is "crap."  He denies any significant changes with his gait or balance.  He does use a walker.  Returns today for an evaluation.  HISTORY (copied from Dr. Cathren Laine note) Interval history 05/01/2020:MRI brain showed enlarged ventricles very suspicious for NPH but also moderately advanced atrophy. Formal memory testing showed possibly a component of NPH. Large volume tap did not show any patient improvement but given findings on formal memory testing may consider temporary lumbar drain. To complicate more, DAT scan showed possible parkinsonian syndrome which may  be affecting his gait. Cannot rule out multiple etiologies. Will send to Neurosurgery for eval of a temporary lumbar drain.    Interval history February 15, 2020: Patient is here today for evaluation after high-volume lumbar puncture for possible normal pressure hydrocephalus. Per wife Mobility is not better. Unclear if cognition is improved but she does not think, patient's wife thinks he "plays crazy" so not sure if he is ignoring her or not. It was "[facility]" getting him here, she told him to wash up and he was acting like he didn;t know how, or how to get dressed it was a battle and she thinks it is on purpose. (Wife playing video games throughout the appointment on her phone).He says he ignores it sometimes but he does not feel he had any improvement in cognition. Wife says he is "up and down" all night. Wife has "had it". He says he can be "obstinant" and not follow her instructions.    I did discuss with wife even if we treat the NPH he still may need additional care, she has been calling our office recently saying she cannot care for him anymore although nothing acute or subacutely new, patient is going to have to talk to family and primary care and possibly discuss with social work any help that she can get for patient.       HPI:  [name] is a 71 y.o. male here as requested urgently by Jani Gravel, MD for confusion and unsteady gait.  PMHx hypertension, GERD, gastroparesis, esophageal cancer, diabetes, chronic kidney disease stage III, Barrett esophagus I reviewed Dr. Julianne Rice notes, confusion getting worse in the last few months, and steady gait for more than a year but getting worse, impaired hand eye coordination, voice getting weaker especially in the last 3 weeks, difficulty remembering how to find his way to places he wants to go, difficulty walking since February 2020, he has been to physical therapy, he has been falling at least several times over the prior few weeks, dropping things,  knocking things over when he reaches for things, high-end eye coordination not as good as it was, trouble remembering names and trouble remembering words, forgetful and confused, he can member where he is going so he is not driving and he cannot remember how to use his cell phone does not always understand what his wife is telling him, he has to be told everything to do to one step at a time.  Also had hypertension and his amlodipine was held at last appointment.  I reviewed labs which included CBC which was unremarkable mild anemia, BMP showed a BUN of 29, creatinine 1.3, decreased GFR, otherwise unremarkable including normal LFTs, ferritin was 31, A1c 7.4, LDL 63, urinalysis did not show infection, all these labs were collected November 02, 2019.     Patient says he is frustrated, "I can't remember crap", wife provides most information, they recently were evicted and things worsened since then so a lot of stress in life. Balance issues ongoing 1-2 years at least and progressively worsening, he says he just all of a sudden falls, he may trip over things, sometimes he just loses his balance and falls he misinterprets where certain places are in the room, he has difficulty with depth perception, He denies significant numbness in the legs or weakness sin the legs. He is walking slow, wife says he is shuffling. No shaking or tremors. Voice is becoming softer it is hard for him to project. No problem with smell. He is barely eating part may be depression lately, they are in a hotel right now. No active dreams, no significant snoring, he has a FHx of stroke but not dementia in the family. Whole body affected, he can;t sit up straight, when he walks he bends over. He has scoliosis but no new or significant low back or neck pain or radicular symptoms. They deny memory changes except in the last few weeks in the setting of stress he has forgotten how to use his cell phone for example like he is a child. Vision and hearing is  fine. Most of the time he can make it to the bathroom.No other focal neurologic deficits, associated symptoms, inciting events or modifiable factors.   Reviewed notes, labs and imaging from outside physicians, which showed:   MRI brain 2009: reviewed images and agree with these findings:   Normal craniovertebral junction.  Diffuse ventricular  dilatation.  Dilatation of the lateral, third, and fourth  ventricles.  The temporal horns are mildly dilated. Mild cerebral  atrophy. There are some atrophic changes involving the cerebellum,  mainly the vermis with enlargement of the folia however I feel that  the fourth ventricular dilatation is disproportionate.     Negative for mass, hemorrhage, or acute/subacute infarction.  Patency of the vertebral, basilar, and internal carotid arteries.     IMPRESSION:  Diffuse ventricular dilatation.  I cannot exclude normal pressure  hydrocephalus. See above comments.    REVIEW OF SYSTEMS: Out of a complete  14 system review of symptoms, the patient complains only of the following symptoms, and all other reviewed systems are negative.  ALLERGIES: Allergies  Allergen Reactions   Duragen [Estradiol Valerate] Other (See Comments)    unknown   Fentanyl Other (See Comments)    hallucinations   Morphine And Related Other (See Comments)    Hallucinations.    HOME MEDICATIONS: Outpatient Medications Prior to Visit  Medication Sig Dispense Refill   acetaminophen (TYLENOL) 500 MG tablet Take 500-1,000 mg by mouth every 6 (six) hours as needed for moderate pain or headache.     amLODipine (NORVASC) 10 MG tablet Take 10 mg by mouth daily.     bismuth subsalicylate (PEPTO BISMOL) 262 MG/15ML suspension Take 30 mLs by mouth every 6 (six) hours as needed for diarrhea or loose stools or indigestion.     Multiple Vitamin (MULTIVITAMIN WITH MINERALS) TABS tablet Take 1 tablet by mouth daily. Centrum     omeprazole (PRILOSEC) 40 MG capsule Take 40 mg by mouth 2  (two) times daily.     amoxicillin-clavulanate (AUGMENTIN) 500-125 MG tablet Take 1 tablet by mouth 2 (two) times daily.     esomeprazole (NEXIUM) 20 MG capsule Take 40 mg by mouth daily.     Iron-FA-B Cmp-C-Biot-Probiotic (FUSION PLUS) CAPS Take 1 capsule by mouth daily.     losartan-hydrochlorothiazide (HYZAAR) 50-12.5 MG tablet Take 1 tablet by mouth daily.     metFORMIN (GLUCOPHAGE-XR) 500 MG 24 hr tablet Take 500 mg by mouth daily with supper.     traMADol (ULTRAM) 50 MG tablet Take 1-2 tablets (50-100 mg total) by mouth every 6 (six) hours as needed for moderate pain. 20 tablet 0   No facility-administered medications prior to visit.    PAST MEDICAL HISTORY: Past Medical History:  Diagnosis Date   Anemia    Barrett esophagus    Chronic kidney disease (CKD), stage III (moderate) (HCC)    Dementia ([place])    Depression    Diabetes mellitus ([place])    Esophageal cancer (HCC)    Gastroparesis    GERD (gastroesophageal reflux disease)    History of MRSA infection    Hydrocephalus (HCC)    Hypertension    Parkinson's disease ([place])    Pneumonia    Sleep apnea    no longer uses a cpap    PAST SURGICAL HISTORY: Past Surgical History:  Procedure Laterality Date   CYST ENTEROSTOMY Left 03/15/2021   Procedure: excision of sebaceous cyst of left lower abdomen;  Surgeon: Ardis Hughs, MD;  Location: [facility];  Service: Urology;  Laterality: Left;   LAPAROSCOPIC NEPHRECTOMY Left 03/15/2021   Procedure: LEFT LAPAROSCOPIC RADICAL NEPHRECTOMY WITH LYSIS OF ADHESIONS;  Surgeon: Ardis Hughs, MD;  Location: [facility];  Service: Urology;  Laterality: Left;   Left VATS, Left thoracoabdominal esophagogastrectomy with jejunostomy and pyloroplasty     04/24/2004 Dr Arlyce Dice   LUMBAR PUNCTURE  02/15/2020   PLACEMENT OF LUMBAR DRAIN N/A 08/14/2020   Procedure: PLACEMENT OF LUMBAR DRAIN;  Surgeon: Judith Part, MD;  Location: [facility];  Service: Neurosurgery;  Laterality: N/A;   R  cataract and retinal tears     SHUNT REPLACEMENT N/A 09/11/2020   Procedure: Placement of Lumboperitoneal Shunt;  Surgeon: Judith Part, MD;  Location: [place];  Service: Neurosurgery;  Laterality: N/A;    FAMILY HISTORY: Family History  Problem Relation Age of Onset   Cancer Mother        colon, breast  Heart disease Father    Diabetes Other        FX HX   Stroke Paternal Aunt    Dementia Neg Hx    Parkinson's disease Neg Hx     SOCIAL HISTORY: Social History   Socioeconomic History   Marital status: Married    Spouse name: Not on file   Number of children: Not on file   Years of education: Not on file   Highest education level: Master's degree (e.g., MA, MS, MEng, MEd, MSW, MBA)  Occupational History   Occupation: minister  Tobacco Use   Smoking status: Never   Smokeless tobacco: Never  Vaping Use   Vaping Use: Never used  Substance and Sexual Activity   Alcohol use: Not Currently    Comment: occasionally drinks Bailey's   Drug use: No   Sexual activity: Yes  Other Topics Concern   Not on file  Social History Narrative   Lives at home with wife   Right handed   Social Determinants of Health   Financial Resource Strain: Not on file  Food Insecurity: Not on file  Transportation Needs: Not on file  Physical Activity: Not on file  Stress: Not on file  Social Connections: Not on file  Intimate Partner Violence: Not on file      PHYSICAL EXAM  Vitals:   11/28/21 1057  BP: 121/73  Pulse: 89  Weight: 184 lb 9.6 oz (83.7 kg)  Height: '6\' 5"'$  (1.956 m)   Body mass index is 21.89 kg/m.    11/28/2021   12:10 PM 03/17/2020    9:00 AM 02/16/2020    2:25 PM  MMSE - Mini Mental State Exam  Orientation to time '5 1 1  '$ Orientation to Place '5 4 4  '$ Registration '3 3 3  '$ Attention/ Calculation 3 0 0  Recall 2 0 1  Language- name 2 objects '2 2 2  '$ Language- repeat '1 1 1  '$ Language- follow 3 step command '3 3 3  '$ Language- read & follow direction '1 1 1  '$ Write  a sentence 1 1 0  Copy design 0 0 0  Total score '26 16 16     '$ Generalized: Well developed, in no acute distress   Neurological examination  Mentation: Alert oriented to time, place, history taking. Follows all commands speech and language fluent.  Cranial nerve II-XII: Pupils were equal round reactive to light. Extraocular movements were full, visual field were full on confrontational test. Facial sensation and strength were normal.  Head turning and shoulder shrug  were normal and symmetric. Motor: The motor testing reveals 5 over 5 strength of all 4 extremities. Good symmetric motor tone is noted throughout.  Sensory: Sensory testing is intact to soft touch on all 4 extremities. No evidence of extinction is noted.  Coordination: Cerebellar testing reveals good finger-nose-finger and heel-to-shin bilaterally.     DIAGNOSTIC DATA (LABS, IMAGING, TESTING) - I reviewed patient records, labs, notes, testing and imaging myself where available.  Lab Results  Component Value Date   WBC 6.9 03/18/2021   HGB 9.6 (L) 03/18/2021   HCT 29.7 (L) 03/18/2021   MCV 97.4 03/18/2021   PLT 193 03/18/2021      Component Value Date/Time   NA 136 03/18/2021 0514   NA 137 02/16/2020 1114   K 4.1 03/18/2021 0514   CL 101 03/18/2021 0514   CO2 29 03/18/2021 0514   GLUCOSE 122 (H) 03/18/2021 0514   BUN 48 (H) 03/18/2021 3086  BUN 45 (H) 02/16/2020 1114   CREATININE 1.83 (H) 03/18/2021 0514   CALCIUM 8.7 (L) 03/18/2021 0514   PROT 6.8 03/09/2021 1000   PROT 6.7 02/16/2020 1114   ALBUMIN 3.9 03/09/2021 1000   ALBUMIN 4.4 02/16/2020 1114   AST 14 (L) 03/09/2021 1000   ALT 13 03/09/2021 1000   ALKPHOS 68 03/09/2021 1000   BILITOT 0.9 03/09/2021 1000   BILITOT 1.2 02/16/2020 1114   GFRNONAA 39 (L) 03/18/2021 0514   GFRAA 47 (L) 02/16/2020 1114   Lab Results  Component Value Date   CHOL  08/28/2007    83        ATP III CLASSIFICATION:  <200     mg/dL   Desirable  200-239  mg/dL    Borderline High  >=240    mg/dL   High   HDL 21 (L) 08/28/2007   LDLCALC  08/28/2007    48        Total Cholesterol/HDL:CHD Risk Coronary Heart Disease Risk Table                     Men   Women  1/2 Average Risk   3.4   3.3   TRIG 69 08/28/2007   CHOLHDL 4.0 08/28/2007   Lab Results  Component Value Date   HGBA1C 7.1 (H) 03/09/2021   Lab Results  Component Value Date   CHYIFOYD74 128 06/25/2020   Lab Results  Component Value Date   TSH 1.290 02/16/2020      ASSESSMENT AND PLAN 71 y.o. year old male  has a past medical history of Anemia, Barrett esophagus, Chronic kidney disease (CKD), stage III (moderate) ([place]), Dementia ([place]), Depression, Diabetes mellitus ([place]), Esophageal cancer ([place]), Gastroparesis, GERD (gastroesophageal reflux disease), History of MRSA infection, Hydrocephalus ([place]), Hypertension, Parkinson's disease ([place]), Pneumonia, and Sleep apnea. here with :   1.  Normal pressure hydrocephalus  -- MMSE score has improved 26/30 previously 20/30.  -- gait is stable if not improved  -- Patient will follow-up in 1 year or sooner if needed.  Ward Givens, MSN, NP-C 11/28/2021, 11:41 AM [facility] [address] [place], [place] [address] [phone]

## 2021-12-17 DIAGNOSIS — N184 Chronic kidney disease, stage 4 (severe): Secondary | ICD-10-CM | POA: Diagnosis not present

## 2021-12-28 DIAGNOSIS — N189 Chronic kidney disease, unspecified: Secondary | ICD-10-CM | POA: Diagnosis not present

## 2021-12-28 DIAGNOSIS — N2889 Other specified disorders of kidney and ureter: Secondary | ICD-10-CM | POA: Diagnosis not present

## 2021-12-28 DIAGNOSIS — Z905 Acquired absence of kidney: Secondary | ICD-10-CM | POA: Diagnosis not present

## 2021-12-28 DIAGNOSIS — D631 Anemia in chronic kidney disease: Secondary | ICD-10-CM | POA: Diagnosis not present

## 2021-12-28 DIAGNOSIS — N184 Chronic kidney disease, stage 4 (severe): Secondary | ICD-10-CM | POA: Diagnosis not present

## 2021-12-28 DIAGNOSIS — I129 Hypertensive chronic kidney disease with stage 1 through stage 4 chronic kidney disease, or unspecified chronic kidney disease: Secondary | ICD-10-CM | POA: Diagnosis not present

## 2021-12-28 DIAGNOSIS — E875 Hyperkalemia: Secondary | ICD-10-CM | POA: Diagnosis not present

## 2021-12-28 DIAGNOSIS — R809 Proteinuria, unspecified: Secondary | ICD-10-CM | POA: Diagnosis not present

## 2021-12-28 DIAGNOSIS — R609 Edema, unspecified: Secondary | ICD-10-CM | POA: Diagnosis not present

## 2021-12-28 DIAGNOSIS — N2581 Secondary hyperparathyroidism of renal origin: Secondary | ICD-10-CM | POA: Diagnosis not present

## 2021-12-31 DIAGNOSIS — N189 Chronic kidney disease, unspecified: Secondary | ICD-10-CM | POA: Diagnosis not present

## 2022-01-08 ENCOUNTER — Other Ambulatory Visit (HOSPITAL_COMMUNITY): Payer: Self-pay | Admitting: Urology

## 2022-01-08 ENCOUNTER — Ambulatory Visit (HOSPITAL_COMMUNITY)
Admission: RE | Admit: 2022-01-08 | Discharge: 2022-01-08 | Disposition: A | Discharge status: Home / Self Care | Payer: Medicare PPO | Source: Ambulatory Visit | Attending: Urology | Admitting: Urology

## 2022-01-08 DIAGNOSIS — R351 Nocturia: Secondary | ICD-10-CM | POA: Diagnosis not present

## 2022-01-08 DIAGNOSIS — C642 Malignant neoplasm of left kidney, except renal pelvis: Secondary | ICD-10-CM | POA: Insufficient documentation

## 2022-01-08 DIAGNOSIS — N401 Enlarged prostate with lower urinary tract symptoms: Secondary | ICD-10-CM | POA: Diagnosis not present

## 2022-01-08 DIAGNOSIS — J984 Other disorders of lung: Secondary | ICD-10-CM | POA: Diagnosis not present

## 2022-01-28 DIAGNOSIS — K8020 Calculus of gallbladder without cholecystitis without obstruction: Secondary | ICD-10-CM | POA: Diagnosis not present

## 2022-01-28 DIAGNOSIS — K828 Other specified diseases of gallbladder: Secondary | ICD-10-CM | POA: Diagnosis not present

## 2022-01-28 DIAGNOSIS — C642 Malignant neoplasm of left kidney, except renal pelvis: Secondary | ICD-10-CM | POA: Diagnosis not present

## 2022-01-28 DIAGNOSIS — C649 Malignant neoplasm of unspecified kidney, except renal pelvis: Secondary | ICD-10-CM | POA: Diagnosis not present

## 2022-02-04 ENCOUNTER — Ambulatory Visit: Payer: Medicare PPO | Admitting: Podiatry

## 2022-02-04 ENCOUNTER — Telehealth: Payer: Self-pay | Admitting: Podiatry

## 2022-02-04 DIAGNOSIS — C642 Malignant neoplasm of left kidney, except renal pelvis: Secondary | ICD-10-CM | POA: Diagnosis not present

## 2022-02-04 DIAGNOSIS — R3915 Urgency of urination: Secondary | ICD-10-CM | POA: Diagnosis not present

## 2022-02-04 NOTE — Telephone Encounter (Signed)
Pt left message at 806am today stating he needed a return call about his appt.   I returned call and left message for pt to call to discuss further.

## 2022-02-15 DIAGNOSIS — F0390 Unspecified dementia without behavioral disturbance: Secondary | ICD-10-CM | POA: Diagnosis not present

## 2022-02-15 DIAGNOSIS — E1165 Type 2 diabetes mellitus with hyperglycemia: Secondary | ICD-10-CM | POA: Diagnosis not present

## 2022-02-15 DIAGNOSIS — Z0000 Encounter for general adult medical examination without abnormal findings: Secondary | ICD-10-CM | POA: Diagnosis not present

## 2022-02-15 DIAGNOSIS — M7021 Olecranon bursitis, right elbow: Secondary | ICD-10-CM | POA: Diagnosis not present

## 2022-02-15 DIAGNOSIS — G912 (Idiopathic) normal pressure hydrocephalus: Secondary | ICD-10-CM | POA: Diagnosis not present

## 2022-02-15 DIAGNOSIS — R5383 Other fatigue: Secondary | ICD-10-CM | POA: Diagnosis not present

## 2022-02-15 DIAGNOSIS — I5189 Other ill-defined heart diseases: Secondary | ICD-10-CM | POA: Diagnosis not present

## 2022-02-15 DIAGNOSIS — N184 Chronic kidney disease, stage 4 (severe): Secondary | ICD-10-CM | POA: Diagnosis not present

## 2022-02-22 DIAGNOSIS — G912 (Idiopathic) normal pressure hydrocephalus: Secondary | ICD-10-CM | POA: Diagnosis not present

## 2022-02-22 DIAGNOSIS — E440 Moderate protein-calorie malnutrition: Secondary | ICD-10-CM | POA: Diagnosis not present

## 2022-02-22 DIAGNOSIS — F0390 Unspecified dementia without behavioral disturbance: Secondary | ICD-10-CM | POA: Diagnosis not present

## 2022-02-22 DIAGNOSIS — Z0000 Encounter for general adult medical examination without abnormal findings: Secondary | ICD-10-CM | POA: Diagnosis not present

## 2022-03-04 ENCOUNTER — Ambulatory Visit: Payer: Medicare PPO | Admitting: Podiatry

## 2022-03-04 DIAGNOSIS — E1149 Type 2 diabetes mellitus with other diabetic neurological complication: Secondary | ICD-10-CM | POA: Diagnosis not present

## 2022-03-04 DIAGNOSIS — M79675 Pain in left toe(s): Secondary | ICD-10-CM

## 2022-03-04 DIAGNOSIS — M2041 Other hammer toe(s) (acquired), right foot: Secondary | ICD-10-CM

## 2022-03-04 DIAGNOSIS — M79674 Pain in right toe(s): Secondary | ICD-10-CM

## 2022-03-04 DIAGNOSIS — Z872 Personal history of diseases of the skin and subcutaneous tissue: Secondary | ICD-10-CM

## 2022-03-04 DIAGNOSIS — M2042 Other hammer toe(s) (acquired), left foot: Secondary | ICD-10-CM

## 2022-03-04 DIAGNOSIS — B351 Tinea unguium: Secondary | ICD-10-CM | POA: Diagnosis not present

## 2022-03-04 MED ORDER — GABAPENTIN 100 MG PO CAPS: 100.0000 mg | ORAL_CAPSULE | Freq: Three times a day (TID) | ORAL | 0 refills | Status: DC

## 2022-03-04 NOTE — Patient Instructions (Signed)
Gabapentin Capsules or Tablets- you can start with '100mg'$  once a day at night for a few days then increase to 2 times a day before going to 3 times a day. If you have any issues or side affects with this medication, stop taking it and let me know.  ---  What is this medication? GABAPENTIN (GA ba pen tin) treats nerve pain. It may also be used to prevent and control seizures in people with epilepsy. It works by calming overactive nerves in your body. This medicine may be used for other purposes; ask your health care provider or pharmacist if you have questions. COMMON BRAND NAME(S): Active-PAC with Gabapentin, Orpha Bur, Gralise, Neurontin What should I tell my care team before I take this medication? They need to know if you have any of these conditions: Alcohol or substance use disorder Kidney disease Lung or breathing disease Suicidal thoughts, plans, or attempt; a previous suicide attempt by you or a family member An unusual or allergic reaction to gabapentin, other medications, foods, dyes, or preservatives Pregnant or trying to get pregnant Breast-feeding How should I use this medication? Take this medication by mouth with a glass of water. Follow the directions on the prescription label. You can take it with or without food. If it upsets your stomach, take it with food. Take your medication at regular intervals. Do not take it more often than directed. Do not stop taking except on your care team's advice. If you are directed to break the 600 or 800 mg tablets in half as part of your dose, the extra half tablet should be used for the next dose. If you have not used the extra half tablet within 28 days, it should be thrown away. A special MedGuide will be given to you by the pharmacist with each prescription and refill. Be sure to read this information carefully each time. Talk to your care team about the use of this medication in children. While this medication may be prescribed for children  as young as 3 years for selected conditions, precautions do apply. Overdosage: If you think you have taken too much of this medicine contact a poison control center or emergency room at once. NOTE: This medicine is only for you. Do not share this medicine with others. What if I miss a dose? If you miss a dose, take it as soon as you can. If it is almost time for your next dose, take only that dose. Do not take double or extra doses. What may interact with this medication? Alcohol Antihistamines for allergy, cough, and cold Certain medications for anxiety or sleep Certain medications for depression like amitriptyline, fluoxetine, sertraline Certain medications for seizures like phenobarbital, primidone Certain medications for stomach problems General anesthetics like halothane, isoflurane, methoxyflurane, propofol Local anesthetics like lidocaine, pramoxine, tetracaine Medications that relax muscles for surgery Opioid medications for pain Phenothiazines like chlorpromazine, mesoridazine, prochlorperazine, thioridazine This list may not describe all possible interactions. Give your health care provider a list of all the medicines, herbs, non-prescription drugs, or dietary supplements you use. Also tell them if you smoke, drink alcohol, or use illegal drugs. Some items may interact with your medicine. What should I watch for while using this medication? Visit your care team for regular checks on your progress. You may want to keep a record at home of how you feel your condition is responding to treatment. You may want to share this information with your care team at each visit. You should contact your care team  if your seizures get worse or if you have any new types of seizures. Do not stop taking this medication or any of your seizure medications unless instructed by your care team. Stopping your medication suddenly can increase your seizures or their severity. This medication may cause serious  skin reactions. They can happen weeks to months after starting the medication. Contact your care team right away if you notice fevers or flu-like symptoms with a rash. The rash may be red or purple and then turn into blisters or peeling of the skin. Or, you might notice a red rash with swelling of the face, lips or lymph nodes in your neck or under your arms. Wear a medical identification bracelet or chain if you are taking this medication for seizures. Carry a card that lists all your medications. This medication may affect your coordination, reaction time, or judgment. Do not drive or operate machinery until you know how this medication affects you. Sit up or stand slowly to reduce the risk of dizzy or fainting spells. Drinking alcohol with this medication can increase the risk of these side effects. Your mouth may get dry. Chewing sugarless gum or sucking hard candy, and drinking plenty of water may help. Watch for new or worsening thoughts of suicide or depression. This includes sudden changes in mood, behaviors, or thoughts. These changes can happen at any time but are more common in the beginning of treatment or after a change in dose. Call your care team right away if you experience these thoughts or worsening depression. If you become pregnant while using this medication, you may enroll in the [place] Pregnancy Registry by calling [phone]. This registry collects information about the safety of antiepileptic medication use during pregnancy. What side effects may I notice from receiving this medication? Side effects that you should report to your care team as soon as possible: Allergic reactions or angioedema--skin rash, itching, hives, swelling of the face, eyes, lips, tongue, arms, or legs, trouble swallowing or breathing Rash, fever, and swollen lymph nodes Thoughts of suicide or self harm, worsening mood, feelings of depression Trouble breathing Unusual changes  in mood or behavior in children after use such as difficulty concentrating, hostility, or restlessness Side effects that usually do not require medical attention (report to your care team if they continue or are bothersome): Dizziness Drowsiness Nausea Swelling of ankles, feet, or hands Vomiting This list may not describe all possible side effects. Call your doctor for medical advice about side effects. You may report side effects to FDA at 1-800-FDA-1088. Where should I keep my medication? Keep out of reach of children and pets. Store at room temperature between 15 and 30 degrees C (59 and 86 degrees F). Get rid of any unused medication after the expiration date. This medication may cause accidental overdose and death if taken by other adults, children, or pets. To get rid of medications that are no longer needed or have expired: Take the medication to a medication take-back program. Check with your pharmacy or law enforcement to find a location. If you cannot return the medication, check the label or package insert to see if the medication should be thrown out in the garbage or flushed down the toilet. If you are not sure, ask your care team. If it is safe to put it in the trash, empty the medication out of the container. Mix the medication with cat litter, dirt, coffee grounds, or other unwanted substance. Seal the mixture in a bag  or container. Put it in the trash. NOTE: This sheet is a summary. It may not cover all possible information. If you have questions about this medicine, talk to your doctor, pharmacist, or health care provider.  2023 Elsevier/Gold Standard (2020-05-09 00:00:00)

## 2022-03-04 NOTE — Progress Notes (Unsigned)
Subjective: Chief Complaint  Patient presents with   Diabetes    Diabetic foot care A1c- 7.1 BG- 123 Nail fungus, nail trim, Neuropathy,    71 year old male presents the above concerns.  States his nails are thick and discolored is not able to trim himself.  Denies any ulcerations.  He is questing inserts.  He thinks this will helpwith the neuropathy.  Last A1c 7.1 on 03/09/221  Objective: AAO x3, NAD DP/PT pulses palpable bilaterally, CRT less than 3 seconds Dried blood left hallux toenail and there is no extension any hyperpigmentation in the surrounding skin. Nails are hypertrophic, dystrophic, brittle, discolored, elongated 10. No surrounding redness or drainage. Tenderness nails 1-5 bilaterally. No open lesions or pre-ulcerative lesions are identified today. Hammertoes  No pain with calf compression, swelling, warmth, erythema  Assessment: Symptomatic onychomycosis, type 2 diabetes with neuropathy  Plan: -All treatment options discussed with the patient including all alternatives, risks, complications.  -Sharply debrided the nails x10 without any complications or bleeding -We will follow-up for diabetic shoe, insert measurement -Daily foot inspection -Patient encouraged to call the office with any questions, concerns, change in symptoms.   Trula Slade DPM

## 2022-03-08 ENCOUNTER — Encounter (HOSPITAL_BASED_OUTPATIENT_CLINIC_OR_DEPARTMENT_OTHER): Payer: Self-pay

## 2022-03-08 ENCOUNTER — Other Ambulatory Visit: Payer: Self-pay

## 2022-03-08 ENCOUNTER — Emergency Department (HOSPITAL_BASED_OUTPATIENT_CLINIC_OR_DEPARTMENT_OTHER)
Admission: EM | Admit: 2022-03-08 | Discharge: 2022-03-08 | Disposition: A | Discharge status: Home / Self Care | Payer: Medicare PPO | Attending: Emergency Medicine | Admitting: Emergency Medicine

## 2022-03-08 ENCOUNTER — Emergency Department (HOSPITAL_BASED_OUTPATIENT_CLINIC_OR_DEPARTMENT_OTHER): Payer: Medicare PPO

## 2022-03-08 DIAGNOSIS — Z79899 Other long term (current) drug therapy: Secondary | ICD-10-CM | POA: Insufficient documentation

## 2022-03-08 DIAGNOSIS — W19XXXA Unspecified fall, initial encounter: Secondary | ICD-10-CM

## 2022-03-08 DIAGNOSIS — F0390 Unspecified dementia without behavioral disturbance: Secondary | ICD-10-CM | POA: Insufficient documentation

## 2022-03-08 DIAGNOSIS — W01198A Fall on same level from slipping, tripping and stumbling with subsequent striking against other object, initial encounter: Secondary | ICD-10-CM | POA: Diagnosis not present

## 2022-03-08 DIAGNOSIS — Y92129 Unspecified place in nursing home as the place of occurrence of the external cause: Secondary | ICD-10-CM | POA: Insufficient documentation

## 2022-03-08 DIAGNOSIS — S0101XA Laceration without foreign body of scalp, initial encounter: Secondary | ICD-10-CM

## 2022-03-08 DIAGNOSIS — R944 Abnormal results of kidney function studies: Secondary | ICD-10-CM | POA: Insufficient documentation

## 2022-03-08 DIAGNOSIS — S0990XA Unspecified injury of head, initial encounter: Secondary | ICD-10-CM | POA: Diagnosis not present

## 2022-03-08 DIAGNOSIS — I129 Hypertensive chronic kidney disease with stage 1 through stage 4 chronic kidney disease, or unspecified chronic kidney disease: Secondary | ICD-10-CM | POA: Diagnosis not present

## 2022-03-08 DIAGNOSIS — R21 Rash and other nonspecific skin eruption: Secondary | ICD-10-CM

## 2022-03-08 DIAGNOSIS — N183 Chronic kidney disease, stage 3 unspecified: Secondary | ICD-10-CM | POA: Diagnosis not present

## 2022-03-08 DIAGNOSIS — S098XXA Other specified injuries of head, initial encounter: Secondary | ICD-10-CM | POA: Diagnosis not present

## 2022-03-08 DIAGNOSIS — R58 Hemorrhage, not elsewhere classified: Secondary | ICD-10-CM | POA: Diagnosis not present

## 2022-03-08 DIAGNOSIS — E1122 Type 2 diabetes mellitus with diabetic chronic kidney disease: Secondary | ICD-10-CM | POA: Insufficient documentation

## 2022-03-08 DIAGNOSIS — R7989 Other specified abnormal findings of blood chemistry: Secondary | ICD-10-CM

## 2022-03-08 LAB — URINALYSIS, ROUTINE W REFLEX MICROSCOPIC
Bilirubin Urine: NEGATIVE
Glucose, UA: NEGATIVE mg/dL
Hgb urine dipstick: NEGATIVE
Ketones, ur: NEGATIVE mg/dL
Leukocytes,Ua: NEGATIVE
Nitrite: NEGATIVE
Specific Gravity, Urine: 1.012 (ref 1.005–1.030)
pH: 6 (ref 5.0–8.0)

## 2022-03-08 LAB — CBC WITH DIFFERENTIAL/PLATELET
Abs Immature Granulocytes: 0.06 10*3/uL (ref 0.00–0.07)
Basophils Absolute: 0 10*3/uL (ref 0.0–0.1)
Basophils Relative: 0 %
Eosinophils Absolute: 0.4 10*3/uL (ref 0.0–0.5)
Eosinophils Relative: 4 %
HCT: 30.7 % — ABNORMAL LOW (ref 39.0–52.0)
Hemoglobin: 9.9 g/dL — ABNORMAL LOW (ref 13.0–17.0)
Immature Granulocytes: 1 %
Lymphocytes Relative: 21 %
Lymphs Abs: 1.9 10*3/uL (ref 0.7–4.0)
MCH: 29 pg (ref 26.0–34.0)
MCHC: 32.2 g/dL (ref 30.0–36.0)
MCV: 90 fL (ref 80.0–100.0)
Monocytes Absolute: 0.9 10*3/uL (ref 0.1–1.0)
Monocytes Relative: 10 %
Neutro Abs: 5.7 10*3/uL (ref 1.7–7.7)
Neutrophils Relative %: 64 %
Platelets: 224 10*3/uL (ref 150–400)
RBC: 3.41 MIL/uL — ABNORMAL LOW (ref 4.22–5.81)
RDW: 13.3 % (ref 11.5–15.5)
WBC: 8.9 10*3/uL (ref 4.0–10.5)
nRBC: 0 % (ref 0.0–0.2)

## 2022-03-08 LAB — BASIC METABOLIC PANEL
Anion gap: 10 (ref 5–15)
BUN: 72 mg/dL — ABNORMAL HIGH (ref 8–23)
CO2: 22 mmol/L (ref 22–32)
Calcium: 9.3 mg/dL (ref 8.9–10.3)
Chloride: 103 mmol/L (ref 98–111)
Creatinine, Ser: 3.12 mg/dL — ABNORMAL HIGH (ref 0.61–1.24)
GFR, Estimated: 20 mL/min — ABNORMAL LOW (ref >60)
Glucose, Bld: 117 mg/dL — ABNORMAL HIGH (ref 70–99)
Potassium: 5.4 mmol/L — ABNORMAL HIGH (ref 3.5–5.1)
Sodium: 135 mmol/L (ref 135–145)

## 2022-03-08 NOTE — ED Triage Notes (Addendum)
Pt BIB [facility] from [facility]. Pt started taking gabapentin 2 days ago and reports increased balance issues since starting to take meds. Pt fell today and hit back of head. Small lac. Bleeding controlled at this time. No blood thinners.

## 2022-03-08 NOTE — ED Notes (Signed)
Patient waiting for taxi at this time. Patient states per his facility once taxi arrives he will be greeted with his walker.

## 2022-03-08 NOTE — ED Provider Notes (Signed)
[facility] EMERGENCY DEPT Provider Note   CSN: [id number] Arrival date & time: 03/08/22  1541     History  Chief Complaint  Patient presents with   [name] is a 71 y.o. male.  Patient presents to the emergency department complaining of a fall with laceration to the back of his head.  Patient denies losing conscious during the fall.  He states he starting gabapentin a few days ago and has noticed increasing dizziness since that time.  He was trying to use his rollator and lost his balance and fell.  He states he hit his head on the floor.  He denies blood thinner usage.  Past medical history significant for type II DM, hypertension, CKD stage III, dementia  HPI     Home Medications Prior to Admission medications   Medication Sig Start Date End Date Taking? Authorizing Provider  acetaminophen (TYLENOL) 500 MG tablet Take 500-1,000 mg by mouth every 6 (six) hours as needed for moderate pain or headache.    [provider]  amLODipine (NORVASC) 10 MG tablet Take 10 mg by mouth daily. 10/27/21   [provider]  gabapentin (NEURONTIN) 100 MG capsule Take 1 capsule (100 mg total) by mouth 3 (three) times daily. 03/04/22   Trula Slade, DPM  Multiple Vitamin (MULTIVITAMIN WITH MINERALS) TABS tablet Take 1 tablet by mouth daily. Centrum    [provider]  omeprazole (PRILOSEC) 40 MG capsule Take 40 mg by mouth 2 (two) times daily. 11/16/21   [provider]      Allergies    Duragen [estradiol valerate], Fentanyl, and Morphine and related    Review of Systems   Review of Systems  Skin:  Positive for rash and wound.    Physical Exam Updated Vital Signs BP (!) 143/79   Pulse 94   Temp 98 F (36.7 C)   Resp 18   Ht 6' 4.5" (1.943 m)   Wt 86.2 kg   SpO2 100%   BMI 22.83 kg/m  Physical Exam Vitals and nursing note reviewed.  Constitutional:      General: He is not in acute distress.    Appearance: He  is well-developed.  HENT:     Head: Normocephalic.  Eyes:     Conjunctiva/sclera: Conjunctivae normal.  Cardiovascular:     Rate and Rhythm: Normal rate and regular rhythm.     Heart sounds: No murmur heard. Pulmonary:     Effort: Pulmonary effort is normal. No respiratory distress.     Breath sounds: Normal breath sounds.  Abdominal:     Palpations: Abdomen is soft.     Tenderness: There is no abdominal tenderness.  Musculoskeletal:        General: No swelling.     Cervical back: Neck supple.  Skin:    General: Skin is warm and dry.     Capillary Refill: Capillary refill takes less than 2 seconds.     Findings: Rash present.     Comments: Rash seen in image below on left anterior forearm; 1cm wound on R parietal region  Neurological:     Mental Status: He is alert.  Psychiatric:        Mood and Affect: Mood normal.      ED Results / Procedures / Treatments   Labs (all labs ordered are listed, but only abnormal results are displayed) Labs Reviewed  BASIC METABOLIC PANEL - Abnormal; Notable for the following components:  Result Value   Potassium 5.4 (*)    Glucose, Bld 117 (*)    BUN 72 (*)    Creatinine, Ser 3.12 (*)    GFR, Estimated 20 (*)    All other components within normal limits  CBC WITH DIFFERENTIAL/PLATELET - Abnormal; Notable for the following components:   RBC 3.41 (*)    Hemoglobin 9.9 (*)    HCT 30.7 (*)    All other components within normal limits  URINALYSIS, ROUTINE W REFLEX MICROSCOPIC - Abnormal; Notable for the following components:   Protein, ur TRACE (*)    All other components within normal limits    EKG None  Radiology CT Head Wo Contrast  Result Date: 03/08/2022 CLINICAL DATA:  Trauma EXAM: CT HEAD WITHOUT CONTRAST TECHNIQUE: Contiguous axial images were obtained from the base of the skull through the vertex without intravenous contrast. RADIATION DOSE REDUCTION: This exam was performed according to the departmental  dose-optimization program which includes automated exposure control, adjustment of the mA and/or kV according to patient size and/or use of iterative reconstruction technique. COMPARISON:  11/23/2021 FINDINGS: Brain: No acute intracranial findings are seen. There is no evidence of recent bleeding within the cranium. There is interval clearing of small chronic left hemispheric subdural hematoma/hygroma in comparison with the study of 11/23/2021. Ventricles have not changed significantly. Cortical sulci are prominent. Vascular: Scattered arterial calcifications are seen. Skull: Unremarkable. Sinuses/Orbits: Unremarkable. Other: None. IMPRESSION: No acute intracranial findings are seen in noncontrast CT brain. Electronically Signed   By: Elmer Picker M.D.   On: 03/08/2022 20:19    Procedures .Marland KitchenLaceration Repair  Date/Time: 03/08/2022 10:01 PM  Performed by: Dorothyann Peng, PA-C Authorized by: Dorothyann Peng, PA-C   Consent:    Consent obtained:  Verbal   Consent given by:  Patient   Risks discussed:  Infection, need for additional repair, pain, poor cosmetic result and poor wound healing   Alternatives discussed:  No treatment and delayed treatment Universal protocol:    Procedure explained and questions answered to patient or proxy's satisfaction: yes     Relevant documents present and verified: yes     Test results available: yes     Imaging studies available: yes     Required blood products, implants, devices, and special equipment available: yes     Site/side marked: yes     Immediately prior to procedure, a time out was called: yes     Patient identity confirmed:  Verbally with patient Anesthesia:    Anesthesia method:  None Laceration details:    Location:  Scalp   Scalp location:  R parietal   Length (cm):  1   Depth (mm):  2 Pre-procedure details:    Preparation:  Patient was prepped and draped in usual sterile fashion Exploration:    Limited defect created (wound  extended): yes     Hemostasis achieved with:  Direct pressure Treatment:    Area cleansed with:  Shur-Clens   Amount of cleaning:  Standard Skin repair:    Repair method:  Staples   Number of staples:  1 Approximation:    Approximation:  Close Repair type:    Repair type:  Simple Post-procedure details:    Dressing:  Open (no dressing)   Procedure completion:  Tolerated well, no immediate complications     Medications Ordered in ED Medications - No data to display  ED Course/ Medical Decision Making/ A&P  Medical Decision Making Amount and/or Complexity of Data Reviewed Labs: ordered. Radiology: ordered.   Patient presents with a chief complaint of head injury.  Differential diagnosis includes but is not due to intracranial abnormality, soft tissue injury, fracture, and others  I reviewed the patient's past medical history.  Recent visit for type 2 diabetes mellitus with podiatry reviewed.  No note available showing recent prescription of gabapentin  I ordered and interpreted labs.  Pertinent results include potassium 5.4, unremarkable urinalysis, creatinine 3.12 (baseline 1 year ago around 2.0), hemoglobin 9.9 close to baseline  I ordered and interpreted imaging including a CT head.  No acute findings noted.  I agree with radiologist findings.  Laceration on the patient's head was closed with 1 staple as noted in procedure note above.  Discussed patient's increased creatinine with the patient.  He states he has a kidney specialist and has had 1 kidney removed.  He states he follows closely with them.  He does not wish to have admission for observation or fluid administration.  He wishes to follow-up with him as an outpatient with seems perfectly reasonable knowing his history.  Patient has no intracranial abnormality.  It appears he has just the laceration to the scalp which will require staple removal in approximately 1 week.   As to the patient's  rash is unclear as to the underlying etiology.  Does not appear to be DIC, TENS, or other life-threatening condition.  Patient with scheduled dermatology appointment approximately 1 month.  He may follow-up with dermatology at that time about the rash.  Patient may discharge home at this time.       Final Clinical Impression(s) / ED Diagnoses Final diagnoses:  Fall, initial encounter  Scalp laceration, initial encounter  Elevated serum creatinine  Rash and nonspecific skin eruption    Rx / DC Orders ED Discharge Orders     None         Ronny Bacon 03/08/22 2214    Elnora Morrison, MD 03/09/22 0008

## 2022-03-08 NOTE — Discharge Instructions (Addendum)
You were seen today after a fall.  Your CT was reassuring for no intracranial abnormality.  Your scalp was repaired with 1 staple which will need to be removed in approximately 1 week to 10 days.  He may follow-up with any healthcare provider to have the staple removed.  Your creatinine was elevated with a level of 3.12.  I am unable to locate records to show your most recent baseline from your kidney specialist.  As discussed, please follow-up with your nephrologist for further evaluation and management of your creatinine.  Keep your scheduled appointment with dermatology to evaluate the rash.

## 2022-03-08 NOTE — ED Notes (Signed)
1x staple placed by provider in posterior head at this time. Wound cleaned and irrigated by this RN prior and after.

## 2022-03-11 ENCOUNTER — Ambulatory Visit (INDEPENDENT_AMBULATORY_CARE_PROVIDER_SITE_OTHER): Payer: Medicare PPO | Admitting: *Deleted

## 2022-03-11 DIAGNOSIS — M2041 Other hammer toe(s) (acquired), right foot: Secondary | ICD-10-CM

## 2022-03-11 DIAGNOSIS — M2042 Other hammer toe(s) (acquired), left foot: Secondary | ICD-10-CM

## 2022-03-11 DIAGNOSIS — Z872 Personal history of diseases of the skin and subcutaneous tissue: Secondary | ICD-10-CM

## 2022-03-11 DIAGNOSIS — E1149 Type 2 diabetes mellitus with other diabetic neurological complication: Secondary | ICD-10-CM

## 2022-03-11 NOTE — Progress Notes (Signed)
Patient presents to the office today for diabetic shoe and insole measuring.  Patient was measured with brannock device to determine size and width for 1 pair of extra depth shoes and foam casted for 3 pair of insoles.   Documentation of medical necessity will be sent to patient's treating diabetic doctor to verify and sign.   Patient's diabetic provider: Dr. [name]  Shoes and insoles will be ordered at that time and patient will be notified for an appointment for fitting when they arrive.   Shoe size (per patient): 16   Brannock measurement: No measurements done since patient is getting the largest size available in stock  Patient shoe selection-   Shoe choice:   Apex 1260   Shoe size ordered: Men's 16 Wide

## 2022-03-13 DIAGNOSIS — H35373 Puckering of macula, bilateral: Secondary | ICD-10-CM | POA: Diagnosis not present

## 2022-03-13 DIAGNOSIS — Z961 Presence of intraocular lens: Secondary | ICD-10-CM | POA: Diagnosis not present

## 2022-03-13 DIAGNOSIS — H31093 Other chorioretinal scars, bilateral: Secondary | ICD-10-CM | POA: Diagnosis not present

## 2022-03-13 DIAGNOSIS — E113593 Type 2 diabetes mellitus with proliferative diabetic retinopathy without macular edema, bilateral: Secondary | ICD-10-CM | POA: Diagnosis not present

## 2022-03-13 DIAGNOSIS — H35363 Drusen (degenerative) of macula, bilateral: Secondary | ICD-10-CM | POA: Diagnosis not present

## 2022-03-21 ENCOUNTER — Ambulatory Visit
Admission: EM | Admit: 2022-03-21 | Discharge: 2022-03-21 | Disposition: A | Discharge status: Home / Self Care | Payer: Medicare PPO

## 2022-03-21 ENCOUNTER — Encounter: Payer: Self-pay | Admitting: Emergency Medicine

## 2022-03-21 DIAGNOSIS — Z4802 Encounter for removal of sutures: Secondary | ICD-10-CM | POA: Diagnosis not present

## 2022-03-21 NOTE — ED Provider Notes (Signed)
[facility] - URGENT [facility]ARE C[facility]  Note:  This document was prepared using Systems analyst and may include unintentional dictation errors.  MRN: [id number] DOB: 18-Apr-1951  Subjective:   [name] is a 71 y.o. male presenting for staple removal of the scalp.  Patient had 1 staple placed at [facility] emergency room.  No fever, drainage of pus or bleeding.  No current facility-administered medications for this encounter.  Current Outpatient Medications:    acetaminophen (TYLENOL) 500 MG tablet, Take 500-1,000 mg by mouth every 6 (six) hours as needed for moderate pain or headache., Disp: , Rfl:    amLODipine (NORVASC) 10 MG tablet, Take 10 mg by mouth daily., Disp: , Rfl:    gabapentin (NEURONTIN) 100 MG capsule, Take 1 capsule (100 mg total) by mouth 3 (three) times daily., Disp: 90 capsule, Rfl: 0   Multiple Vitamin (MULTIVITAMIN WITH MINERALS) TABS tablet, Take 1 tablet by mouth daily. Centrum, Disp: , Rfl:    omeprazole (PRILOSEC) 40 MG capsule, Take 40 mg by mouth 2 (two) times daily., Disp: , Rfl:    Allergies  Allergen Reactions   Duragen [Estradiol Valerate] Other (See Comments)    unknown   Fentanyl Other (See Comments)    hallucinations   Morphine And Related Other (See Comments)    Hallucinations.    Past Medical History:  Diagnosis Date   Anemia    Barrett esophagus    Chronic kidney disease (CKD), stage III (moderate) (HCC)    Dementia (HCC)    Depression    Diabetes mellitus ([place])    Esophageal cancer (HCC)    Gastroparesis    GERD (gastroesophageal reflux disease)    History of MRSA infection    Hydrocephalus ([place])    Hypertension    Parkinson's disease    Pneumonia    Sleep apnea    no longer uses a cpap     Past Surgical History:  Procedure Laterality Date   CYST ENTEROSTOMY Left 03/15/2021   Procedure: excision of sebaceous cyst of left lower abdomen;  Surgeon: Ardis Hughs, MD;  Location: [facility];  Service:  Urology;  Laterality: Left;   LAPAROSCOPIC NEPHRECTOMY Left 03/15/2021   Procedure: LEFT LAPAROSCOPIC RADICAL NEPHRECTOMY WITH LYSIS OF ADHESIONS;  Surgeon: Ardis Hughs, MD;  Location: [facility];  Service: Urology;  Laterality: Left;   Left VATS, Left thoracoabdominal esophagogastrectomy with jejunostomy and pyloroplasty     04/24/2004 Dr Arlyce Dice   LUMBAR PUNCTURE  02/15/2020   PLACEMENT OF LUMBAR DRAIN N/A 08/14/2020   Procedure: PLACEMENT OF LUMBAR DRAIN;  Surgeon: Judith Part, MD;  Location: [facility];  Service: Neurosurgery;  Laterality: N/A;   R cataract and retinal tears     SHUNT REPLACEMENT N/A 09/11/2020   Procedure: Placement of Lumboperitoneal Shunt;  Surgeon: Judith Part, MD;  Location: [facility];  Service: Neurosurgery;  Laterality: N/A;    Family History  Problem Relation Age of Onset   Cancer Mother        colon, breast   Heart disease Father    Diabetes Other        FX HX   Stroke Paternal Aunt    Dementia Neg Hx    Parkinson's disease Neg Hx     Social History   Tobacco Use   Smoking status: Never   Smokeless tobacco: Never  Vaping Use   Vaping Use: Never used  Substance Use Topics   Alcohol use: Not Currently    Comment:  occasionally drinks Bailey's   Drug use: No    ROS   Objective:   Vitals: BP 115/69 (BP Location: Left Arm)   Pulse 92   Temp 98.2 F (36.8 C) (Oral)   Resp 17   SpO2 97%   Physical Exam Constitutional:      General: He is not in acute distress.    Appearance: Normal appearance. He is well-developed and normal weight. He is not ill-appearing, toxic-appearing or diaphoretic.  HENT:     Head: Normocephalic and atraumatic.     Comments: Well-approximated wound, site is clean, dry and intact. There is no tenderness, drainage, erythema.    Right Ear: External ear normal.     Left Ear: External ear normal.     Nose: Nose normal.     Mouth/Throat:     Pharynx: Oropharynx is clear.  Eyes:     General: No scleral  icterus.       Right eye: No discharge.        Left eye: No discharge.     Extraocular Movements: Extraocular movements intact.  Cardiovascular:     Rate and Rhythm: Normal rate.  Pulmonary:     Effort: Pulmonary effort is normal.  Musculoskeletal:     Cervical back: Normal range of motion.  Neurological:     Mental Status: He is alert and oriented to person, place, and time.  Psychiatric:        Mood and Affect: Mood normal.        Behavior: Behavior normal.        Thought Content: Thought content normal.        Judgment: Judgment normal.    Staple removed without incident.  Assessment and Plan :   PDMP not reviewed this encounter.  1. Encounter for staple removal     Anticipatory guidance provided.  Follow-up as needed.   Jaynee Eagles, PA-C 03/21/22 1445

## 2022-03-21 NOTE — ED Triage Notes (Signed)
Pt here to get one staple removed from head. Pt denies any s/s of infection and complications with site.

## 2022-04-01 DIAGNOSIS — N184 Chronic kidney disease, stage 4 (severe): Secondary | ICD-10-CM | POA: Diagnosis not present

## 2022-04-08 DIAGNOSIS — I129 Hypertensive chronic kidney disease with stage 1 through stage 4 chronic kidney disease, or unspecified chronic kidney disease: Secondary | ICD-10-CM | POA: Diagnosis not present

## 2022-04-08 DIAGNOSIS — R609 Edema, unspecified: Secondary | ICD-10-CM | POA: Diagnosis not present

## 2022-04-08 DIAGNOSIS — Z905 Acquired absence of kidney: Secondary | ICD-10-CM | POA: Diagnosis not present

## 2022-04-08 DIAGNOSIS — N184 Chronic kidney disease, stage 4 (severe): Secondary | ICD-10-CM | POA: Diagnosis not present

## 2022-04-08 DIAGNOSIS — D631 Anemia in chronic kidney disease: Secondary | ICD-10-CM | POA: Diagnosis not present

## 2022-04-08 DIAGNOSIS — N2581 Secondary hyperparathyroidism of renal origin: Secondary | ICD-10-CM | POA: Diagnosis not present

## 2022-04-08 DIAGNOSIS — E875 Hyperkalemia: Secondary | ICD-10-CM | POA: Diagnosis not present

## 2022-04-08 DIAGNOSIS — R809 Proteinuria, unspecified: Secondary | ICD-10-CM | POA: Diagnosis not present

## 2022-04-08 DIAGNOSIS — N2889 Other specified disorders of kidney and ureter: Secondary | ICD-10-CM | POA: Diagnosis not present

## 2022-04-09 ENCOUNTER — Telehealth: Payer: Self-pay | Admitting: Podiatry

## 2022-04-09 NOTE — Telephone Encounter (Signed)
Lmom to call back to schedule appt to pick up diabetic shoes   Auth expires  06/19/22

## 2022-04-12 DIAGNOSIS — R488 Other symbolic dysfunctions: Secondary | ICD-10-CM | POA: Diagnosis not present

## 2022-04-15 ENCOUNTER — Other Ambulatory Visit: Payer: Self-pay | Admitting: Nephrology

## 2022-04-15 DIAGNOSIS — N184 Chronic kidney disease, stage 4 (severe): Secondary | ICD-10-CM

## 2022-04-16 DIAGNOSIS — R488 Other symbolic dysfunctions: Secondary | ICD-10-CM | POA: Diagnosis not present

## 2022-04-17 DIAGNOSIS — R488 Other symbolic dysfunctions: Secondary | ICD-10-CM | POA: Diagnosis not present

## 2022-04-18 DIAGNOSIS — R488 Other symbolic dysfunctions: Secondary | ICD-10-CM | POA: Diagnosis not present

## 2022-04-19 DIAGNOSIS — R488 Other symbolic dysfunctions: Secondary | ICD-10-CM | POA: Diagnosis not present

## 2022-04-22 ENCOUNTER — Other Ambulatory Visit: Payer: Medicare PPO

## 2022-04-29 ENCOUNTER — Ambulatory Visit
Admission: RE | Admit: 2022-04-29 | Discharge: 2022-04-29 | Disposition: A | Discharge status: Home / Self Care | Payer: Medicare PPO | Source: Ambulatory Visit | Attending: Nephrology | Admitting: Nephrology

## 2022-04-29 DIAGNOSIS — N281 Cyst of kidney, acquired: Secondary | ICD-10-CM | POA: Diagnosis not present

## 2022-04-29 DIAGNOSIS — R488 Other symbolic dysfunctions: Secondary | ICD-10-CM | POA: Diagnosis not present

## 2022-04-29 DIAGNOSIS — Z905 Acquired absence of kidney: Secondary | ICD-10-CM | POA: Diagnosis not present

## 2022-04-29 DIAGNOSIS — N189 Chronic kidney disease, unspecified: Secondary | ICD-10-CM | POA: Diagnosis not present

## 2022-04-29 DIAGNOSIS — N184 Chronic kidney disease, stage 4 (severe): Secondary | ICD-10-CM

## 2022-04-30 ENCOUNTER — Ambulatory Visit (INDEPENDENT_AMBULATORY_CARE_PROVIDER_SITE_OTHER): Payer: Medicare PPO | Admitting: Podiatry

## 2022-04-30 DIAGNOSIS — Z872 Personal history of diseases of the skin and subcutaneous tissue: Secondary | ICD-10-CM

## 2022-04-30 DIAGNOSIS — E1149 Type 2 diabetes mellitus with other diabetic neurological complication: Secondary | ICD-10-CM

## 2022-04-30 DIAGNOSIS — M2042 Other hammer toe(s) (acquired), left foot: Secondary | ICD-10-CM

## 2022-04-30 DIAGNOSIS — M2041 Other hammer toe(s) (acquired), right foot: Secondary | ICD-10-CM

## 2022-04-30 NOTE — Progress Notes (Signed)
Patient presents today to pick up custom molded foot orthotics recommended by Dr. Ardelle Anton.   Orthotics were dispensed and fit was satisfactory. Reviewed instructions for break-in and wear. Written instructions given to patient.  Patient will follow up as needed.  Patient came in to pick up orthotics and diabetic shoes. Patient was satisfied with the fitting of the shoes and inserts

## 2022-05-02 DIAGNOSIS — R488 Other symbolic dysfunctions: Secondary | ICD-10-CM | POA: Diagnosis not present

## 2022-05-03 DIAGNOSIS — R488 Other symbolic dysfunctions: Secondary | ICD-10-CM | POA: Diagnosis not present

## 2022-05-07 DIAGNOSIS — D631 Anemia in chronic kidney disease: Secondary | ICD-10-CM | POA: Diagnosis not present

## 2022-05-07 DIAGNOSIS — L821 Other seborrheic keratosis: Secondary | ICD-10-CM | POA: Diagnosis not present

## 2022-05-07 DIAGNOSIS — R809 Proteinuria, unspecified: Secondary | ICD-10-CM | POA: Diagnosis not present

## 2022-05-07 DIAGNOSIS — N2889 Other specified disorders of kidney and ureter: Secondary | ICD-10-CM | POA: Diagnosis not present

## 2022-05-07 DIAGNOSIS — I129 Hypertensive chronic kidney disease with stage 1 through stage 4 chronic kidney disease, or unspecified chronic kidney disease: Secondary | ICD-10-CM | POA: Diagnosis not present

## 2022-05-07 DIAGNOSIS — N189 Chronic kidney disease, unspecified: Secondary | ICD-10-CM | POA: Diagnosis not present

## 2022-05-07 DIAGNOSIS — L853 Xerosis cutis: Secondary | ICD-10-CM | POA: Diagnosis not present

## 2022-05-07 DIAGNOSIS — Z905 Acquired absence of kidney: Secondary | ICD-10-CM | POA: Diagnosis not present

## 2022-05-07 DIAGNOSIS — L988 Other specified disorders of the skin and subcutaneous tissue: Secondary | ICD-10-CM | POA: Diagnosis not present

## 2022-05-07 DIAGNOSIS — N2581 Secondary hyperparathyroidism of renal origin: Secondary | ICD-10-CM | POA: Diagnosis not present

## 2022-05-07 DIAGNOSIS — R609 Edema, unspecified: Secondary | ICD-10-CM | POA: Diagnosis not present

## 2022-05-07 DIAGNOSIS — N184 Chronic kidney disease, stage 4 (severe): Secondary | ICD-10-CM | POA: Diagnosis not present

## 2022-05-08 DIAGNOSIS — R488 Other symbolic dysfunctions: Secondary | ICD-10-CM | POA: Diagnosis not present

## 2022-05-09 DIAGNOSIS — R488 Other symbolic dysfunctions: Secondary | ICD-10-CM | POA: Diagnosis not present

## 2022-05-10 DIAGNOSIS — R488 Other symbolic dysfunctions: Secondary | ICD-10-CM | POA: Diagnosis not present

## 2022-05-14 DIAGNOSIS — R488 Other symbolic dysfunctions: Secondary | ICD-10-CM | POA: Diagnosis not present

## 2022-05-15 DIAGNOSIS — R488 Other symbolic dysfunctions: Secondary | ICD-10-CM | POA: Diagnosis not present

## 2022-05-16 DIAGNOSIS — N184 Chronic kidney disease, stage 4 (severe): Secondary | ICD-10-CM | POA: Diagnosis not present

## 2022-05-17 DIAGNOSIS — R488 Other symbolic dysfunctions: Secondary | ICD-10-CM | POA: Diagnosis not present

## 2022-05-20 DIAGNOSIS — R488 Other symbolic dysfunctions: Secondary | ICD-10-CM | POA: Diagnosis not present

## 2022-05-21 DIAGNOSIS — R488 Other symbolic dysfunctions: Secondary | ICD-10-CM | POA: Diagnosis not present

## 2022-05-24 DIAGNOSIS — R488 Other symbolic dysfunctions: Secondary | ICD-10-CM | POA: Diagnosis not present

## 2022-05-27 ENCOUNTER — Ambulatory Visit: Payer: Medicare PPO | Admitting: Podiatry

## 2022-05-27 DIAGNOSIS — E1149 Type 2 diabetes mellitus with other diabetic neurological complication: Secondary | ICD-10-CM

## 2022-05-27 DIAGNOSIS — M79674 Pain in right toe(s): Secondary | ICD-10-CM

## 2022-05-27 DIAGNOSIS — M79675 Pain in left toe(s): Secondary | ICD-10-CM | POA: Diagnosis not present

## 2022-05-27 DIAGNOSIS — B351 Tinea unguium: Secondary | ICD-10-CM

## 2022-05-27 MED ORDER — AMMONIUM LACTATE 12 % EX LOTN: 1.0000 | TOPICAL_LOTION | CUTANEOUS | 0 refills | Status: DC | PRN

## 2022-05-27 NOTE — Progress Notes (Unsigned)
Subjective: Chief Complaint  Patient presents with   routine foot care     72 year old male presents the above concerns.  States he has been doing well.  No open lesions.  States his nails are thick and discolored is not able to trim himself.    [name], MD -last seen February 22, 2022   Objective: AAO x3, NAD DP/PT pulses palpable bilaterally, CRT less than 3 seconds Sensation decreased with Semmes Weinstein monofilament Nails are hypertrophic, dystrophic, brittle, discolored, elongated 10. No surrounding redness or drainage. Tenderness nails 1-5 bilaterally. No open lesions or pre-ulcerative lesions are identified today. Dry skin present. Hammertoes  No pain with calf compression, swelling, warmth, erythema  Assessment: Symptomatic onychomycosis, type 2 diabetes with neuropathy  Plan: -All treatment options discussed with the patient including all alternatives, risks, complications.  -Sharply debrided the nails x10 without any complications or bleeding -Prescribed AmLactin from the skin. -Daily foot inspection -Patient encouraged to call the office with any questions, concerns, change in symptoms.   Trula Slade DPM

## 2022-05-28 DIAGNOSIS — R488 Other symbolic dysfunctions: Secondary | ICD-10-CM | POA: Diagnosis not present

## 2022-05-30 DIAGNOSIS — R488 Other symbolic dysfunctions: Secondary | ICD-10-CM | POA: Diagnosis not present

## 2022-06-04 DIAGNOSIS — R488 Other symbolic dysfunctions: Secondary | ICD-10-CM | POA: Diagnosis not present

## 2022-06-05 DIAGNOSIS — R488 Other symbolic dysfunctions: Secondary | ICD-10-CM | POA: Diagnosis not present

## 2022-06-06 DIAGNOSIS — R488 Other symbolic dysfunctions: Secondary | ICD-10-CM | POA: Diagnosis not present

## 2022-06-11 DIAGNOSIS — R488 Other symbolic dysfunctions: Secondary | ICD-10-CM | POA: Diagnosis not present

## 2022-06-12 DIAGNOSIS — R488 Other symbolic dysfunctions: Secondary | ICD-10-CM | POA: Diagnosis not present

## 2022-06-13 DIAGNOSIS — R488 Other symbolic dysfunctions: Secondary | ICD-10-CM | POA: Diagnosis not present

## 2022-06-14 ENCOUNTER — Encounter: Payer: Self-pay | Admitting: Family Medicine

## 2022-06-14 ENCOUNTER — Other Ambulatory Visit: Payer: Medicare PPO | Admitting: Family Medicine

## 2022-06-14 VITALS — BP 120/60 | HR 56 | Resp 16

## 2022-06-14 DIAGNOSIS — C642 Malignant neoplasm of left kidney, except renal pelvis: Secondary | ICD-10-CM

## 2022-06-14 DIAGNOSIS — G912 (Idiopathic) normal pressure hydrocephalus: Secondary | ICD-10-CM

## 2022-06-14 DIAGNOSIS — R4189 Other symptoms and signs involving cognitive functions and awareness: Secondary | ICD-10-CM

## 2022-06-14 NOTE — Progress Notes (Signed)
[facility] Consult Note Telephone: [phone]  Fax: [phone]    Date of encounter: 06/14/22 7:12 PM PATIENT NAME: [name] [address] [place] [address]   [phone] (home)  DOB: Apr 28, 1951 MRN: [id number] PRIMARY CARE PROVIDER:    Merrilee Mccarthy, [place],  [place] [address] [phone]  REFERRING PROVIDER:   Merrilee Mccarthy, [place] [address] [place],  [place] [address] [phone]  RESPONSIBLE PARTY:    Contact Information     Name Relation Home Work Mobile   Mccarthy,Ronald Spouse [phone]  [phone]   Mccarthy,Ronald Relative   [phone]        I met face to face with patient in his ILF. Palliative Care was asked to follow this patient by consultation request of  Ronald Seashore, MD to address advance care planning and complex medical decision making. This is a follow up visit   ASSESSMENT , SYMPTOM MANAGEMENT AND PLAN / RECOMMENDATIONS:   Renal cell carcinoma s/p left nephrectomy 03/15/21 Notes from Neurologist indicated no chemo or radiation but cannot view Urology notes. Continues to follow up with Nephrologist now with CKD stage 4.   Pt continues on Metformin with goal HGB A1c 8% per PCP, next HGB A1c due in Feb 2024. At next follow up will try to schedule with wife present and have goals of care discussion.   Normal pressure hydrocephalus/cognitive impairment MMSE improved from 20/30 to 26/30 in July 2023 Possibly multifactorial related to possible Parkinson's, dementia and NPH. Keep follow up with neurology in 1 year (July 2024) Encourage use of rollator to help minimize fall risk.  Advance Care Planning/Goals of Care: Goals include to maximize quality of life and symptom management.  CODE STATUS: Full Code      Follow up Palliative Care Visit: Palliative care will continue to follow for complex  medical decision making, advance care planning, and clarification of goals. Return 4 weeks or prn.    This visit was coded based on medical decision making (MDM).  PPS: 60%  HOSPICE ELIGIBILITY/DIAGNOSIS: TBD  Chief Complaint:  Palliative Care is continuing to follow patient for chronic medical management in setting of cognitive impairment.  HISTORY OF PRESENT ILLNESS:  HENCE Mccarthy is a 72 y.o. year old male with cognitive impairment, Parkinsons, NPH, Barrett's esophagus, DM with retinopathy, hx of left renal cell carcinoma s/p left nephrectomy 03/15/21, now with worsening renal function and anemia.  He has had multiple falls.  He is occasionally incontinent of urine but mostly has standby assist for ADLs.  Pt with occasional urinary incontinence, has had 3-4 falls since July 2023 with ER visits.   In the interim he has been seen by Neurology with improvement over time in MMSE following lumbar puncture. His weight and Albumin per PCP notes have stabilized.    History obtained from review of EMR, discussion with [name].      Latest Ref Rng & Units 03/08/2022    7:43 PM 03/18/2021    5:14 AM 03/17/2021    4:55 AM  CBC  WBC 4.0 - 10.5 K/uL 8.9  6.9  6.7   Hemoglobin 13.0 - 17.0 g/dL 9.9  9.6  8.7   Hematocrit 39.0 - 52.0 % 30.7  29.7  27.7   Platelets 150 - 400 K/uL 224  193  176        Latest Ref Rng & Units 03/08/2022    7:43 PM 03/18/2021  5:14 AM 03/17/2021    4:55 AM  CMP  Glucose 70 - 99 mg/dL 117  122  164   BUN 8 - 23 mg/dL 72  48  45   Creatinine 0.61 - 1.24 mg/dL 3.12  1.83  2.08   Sodium 135 - 145 mmol/L 135  136  134   Potassium 3.5 - 5.1 mmol/L 5.4  4.1  4.1   Chloride 98 - 111 mmol/L 103  101  100   CO2 22 - 32 mmol/L '22  29  29   '$ Calcium 8.9 - 10.3 mg/dL 9.3  8.7  8.4    03/08/22 CT head without contrast: FINDINGS: Brain: No acute intracranial findings are seen. There is no evidence of recent bleeding within the cranium. There is interval  clearing of small chronic left hemispheric subdural hematoma/hygroma in comparison with the study of 11/23/2021. Ventricles have not changed significantly. Cortical sulci are prominent.   Vascular: Scattered arterial calcifications are seen.   Skull: Unremarkable.   Sinuses/Orbits: Unremarkable.   Other: None.   IMPRESSION: No acute intracranial findings are seen in noncontrast CT brain.  04/29/22 Renal US: FINDINGS: Right Kidney:   Renal measurements: 9.6 x 4.8 x 5.6 cm = volume: 132 mL. Echogenicity within normal limits. No mass or hydronephrosis visualized. Two adjacent 2.2 cm cyst over the upper pole.   Left Kidney:   Previous nephrectomy.   Bladder:   Appears normal for degree of bladder distention.   Other:   None.   IMPRESSION: 1. Normal size right kidney without hydronephrosis. Previous left nephrectomy. 2. Two adjacent 2.2 cm right upper pole cysts.  I reviewed EMR for available labs, medications, imaging, studies and related documents.  There are no new records since last visit/Records reviewed and summarized above.   ROS General: NAD Cardiovascular: denies chest pain, denies DOE Pulmonary: denies cough/ SOB Abdomen: endorses good appetite, denies constipation, endorses continence of bowel GU: denies dysuria, endorses continence of urine MSK:  denies increased weakness,  no recent falls Skin: denies rashes or wounds Neurological: denies pain, denies insomnia Psych: Endorses positive mood Heme/lymph/immuno: denies bruises, abnormal bleeding  Physical Exam: Current and past weights: 190 lbs on 03/08/22, weight 11/28/21 184 lbs 9.6 oz Constitutional: NAD General: WNWD   CV: S1S2, RRR, no LE edema Pulmonary: CTAB, no increased work of breathing, no cough, room air Abdomen: normo-active BS + 4 quadrants, soft and non tender, no ascites GU: deferred MSK: no sarcopenia, moves all extremities, ambulatory Skin: warm and dry, no rashes or wounds on visible  skin Neuro:  no generalized weakness, no noted cognitive impairment Psych: non-anxious affect, A and O x 3 Hem/lymph/immuno: no widespread bruising   Thank you for the opportunity to participate in the care of [name].  The palliative care team will continue to follow. Please call our office at [phone] if we can be of additional assistance.   Marijo Conception, FNP -C  COVID-19 PATIENT SCREENING TOOL Asked and negative response unless otherwise noted:   Have you had symptoms of covid, tested positive or been in contact with someone with symptoms/positive test in the past 5-10 days?  unknown

## 2022-06-17 DIAGNOSIS — N184 Chronic kidney disease, stage 4 (severe): Secondary | ICD-10-CM | POA: Diagnosis not present

## 2022-06-18 DIAGNOSIS — R488 Other symbolic dysfunctions: Secondary | ICD-10-CM | POA: Diagnosis not present

## 2022-06-21 ENCOUNTER — Other Ambulatory Visit: Payer: Self-pay | Admitting: Urology

## 2022-06-21 DIAGNOSIS — C642 Malignant neoplasm of left kidney, except renal pelvis: Secondary | ICD-10-CM

## 2022-06-21 DIAGNOSIS — R488 Other symbolic dysfunctions: Secondary | ICD-10-CM | POA: Diagnosis not present

## 2022-06-24 DIAGNOSIS — N2889 Other specified disorders of kidney and ureter: Secondary | ICD-10-CM | POA: Diagnosis not present

## 2022-06-24 DIAGNOSIS — N2581 Secondary hyperparathyroidism of renal origin: Secondary | ICD-10-CM | POA: Diagnosis not present

## 2022-06-24 DIAGNOSIS — Z905 Acquired absence of kidney: Secondary | ICD-10-CM | POA: Diagnosis not present

## 2022-06-24 DIAGNOSIS — I129 Hypertensive chronic kidney disease with stage 1 through stage 4 chronic kidney disease, or unspecified chronic kidney disease: Secondary | ICD-10-CM | POA: Diagnosis not present

## 2022-06-24 DIAGNOSIS — E872 Acidosis, unspecified: Secondary | ICD-10-CM | POA: Diagnosis not present

## 2022-06-24 DIAGNOSIS — D631 Anemia in chronic kidney disease: Secondary | ICD-10-CM | POA: Diagnosis not present

## 2022-06-24 DIAGNOSIS — N184 Chronic kidney disease, stage 4 (severe): Secondary | ICD-10-CM | POA: Diagnosis not present

## 2022-06-24 DIAGNOSIS — R609 Edema, unspecified: Secondary | ICD-10-CM | POA: Diagnosis not present

## 2022-06-24 DIAGNOSIS — R809 Proteinuria, unspecified: Secondary | ICD-10-CM | POA: Diagnosis not present

## 2022-06-25 DIAGNOSIS — R488 Other symbolic dysfunctions: Secondary | ICD-10-CM | POA: Diagnosis not present

## 2022-06-26 DIAGNOSIS — R488 Other symbolic dysfunctions: Secondary | ICD-10-CM | POA: Diagnosis not present

## 2022-06-28 DIAGNOSIS — R488 Other symbolic dysfunctions: Secondary | ICD-10-CM | POA: Diagnosis not present

## 2022-07-05 DIAGNOSIS — R488 Other symbolic dysfunctions: Secondary | ICD-10-CM | POA: Diagnosis not present

## 2022-07-11 DIAGNOSIS — E782 Mixed hyperlipidemia: Secondary | ICD-10-CM | POA: Diagnosis not present

## 2022-07-11 DIAGNOSIS — E1165 Type 2 diabetes mellitus with hyperglycemia: Secondary | ICD-10-CM | POA: Diagnosis not present

## 2022-07-11 DIAGNOSIS — I10 Essential (primary) hypertension: Secondary | ICD-10-CM | POA: Diagnosis not present

## 2022-07-11 DIAGNOSIS — R269 Unspecified abnormalities of gait and mobility: Secondary | ICD-10-CM | POA: Diagnosis not present

## 2022-07-12 DIAGNOSIS — R488 Other symbolic dysfunctions: Secondary | ICD-10-CM | POA: Diagnosis not present

## 2022-07-17 DIAGNOSIS — R488 Other symbolic dysfunctions: Secondary | ICD-10-CM | POA: Diagnosis not present

## 2022-07-19 DIAGNOSIS — G912 (Idiopathic) normal pressure hydrocephalus: Secondary | ICD-10-CM | POA: Diagnosis not present

## 2022-07-19 DIAGNOSIS — Z794 Long term (current) use of insulin: Secondary | ICD-10-CM | POA: Diagnosis not present

## 2022-07-19 DIAGNOSIS — E1165 Type 2 diabetes mellitus with hyperglycemia: Secondary | ICD-10-CM | POA: Diagnosis not present

## 2022-07-19 DIAGNOSIS — E11649 Type 2 diabetes mellitus with hypoglycemia without coma: Secondary | ICD-10-CM | POA: Diagnosis not present

## 2022-07-31 ENCOUNTER — Other Ambulatory Visit: Payer: Medicare PPO

## 2022-08-01 DIAGNOSIS — R488 Other symbolic dysfunctions: Secondary | ICD-10-CM | POA: Diagnosis not present

## 2022-08-08 DIAGNOSIS — R488 Other symbolic dysfunctions: Secondary | ICD-10-CM | POA: Diagnosis not present

## 2022-08-09 DIAGNOSIS — E782 Mixed hyperlipidemia: Secondary | ICD-10-CM | POA: Diagnosis not present

## 2022-08-09 DIAGNOSIS — F0390 Unspecified dementia without behavioral disturbance: Secondary | ICD-10-CM | POA: Diagnosis not present

## 2022-08-09 DIAGNOSIS — G912 (Idiopathic) normal pressure hydrocephalus: Secondary | ICD-10-CM | POA: Diagnosis not present

## 2022-08-09 DIAGNOSIS — I10 Essential (primary) hypertension: Secondary | ICD-10-CM | POA: Diagnosis not present

## 2022-08-09 DIAGNOSIS — E1165 Type 2 diabetes mellitus with hyperglycemia: Secondary | ICD-10-CM | POA: Diagnosis not present

## 2022-08-09 DIAGNOSIS — N184 Chronic kidney disease, stage 4 (severe): Secondary | ICD-10-CM | POA: Diagnosis not present

## 2022-08-20 ENCOUNTER — Ambulatory Visit: Payer: Medicare PPO | Admitting: Podiatry

## 2022-08-21 ENCOUNTER — Ambulatory Visit
Admission: RE | Admit: 2022-08-21 | Discharge: 2022-08-21 | Disposition: A | Discharge status: Home / Self Care | Payer: Medicare PPO | Source: Ambulatory Visit | Attending: Urology | Admitting: Urology

## 2022-08-21 DIAGNOSIS — C642 Malignant neoplasm of left kidney, except renal pelvis: Secondary | ICD-10-CM | POA: Diagnosis not present

## 2022-08-21 DIAGNOSIS — K8020 Calculus of gallbladder without cholecystitis without obstruction: Secondary | ICD-10-CM | POA: Diagnosis not present

## 2022-08-21 DIAGNOSIS — R488 Other symbolic dysfunctions: Secondary | ICD-10-CM | POA: Diagnosis not present

## 2022-08-21 DIAGNOSIS — N2889 Other specified disorders of kidney and ureter: Secondary | ICD-10-CM | POA: Diagnosis not present

## 2022-08-21 DIAGNOSIS — Z9889 Other specified postprocedural states: Secondary | ICD-10-CM | POA: Diagnosis not present

## 2022-08-21 MED ORDER — GADOPICLENOL 0.5 MMOL/ML IV SOLN
9.0000 mL | Freq: Once | INTRAVENOUS | Status: AC | PRN
  Administered 2022-08-21: 9 mL via INTRAVENOUS

## 2022-08-22 ENCOUNTER — Ambulatory Visit: Payer: Medicare PPO | Admitting: Podiatry

## 2022-08-22 DIAGNOSIS — M79674 Pain in right toe(s): Secondary | ICD-10-CM

## 2022-08-22 DIAGNOSIS — B351 Tinea unguium: Secondary | ICD-10-CM

## 2022-08-22 DIAGNOSIS — I10 Essential (primary) hypertension: Secondary | ICD-10-CM | POA: Diagnosis not present

## 2022-08-22 DIAGNOSIS — M79675 Pain in left toe(s): Secondary | ICD-10-CM | POA: Diagnosis not present

## 2022-08-22 DIAGNOSIS — N184 Chronic kidney disease, stage 4 (severe): Secondary | ICD-10-CM | POA: Diagnosis not present

## 2022-08-22 DIAGNOSIS — E1122 Type 2 diabetes mellitus with diabetic chronic kidney disease: Secondary | ICD-10-CM | POA: Diagnosis not present

## 2022-08-22 DIAGNOSIS — E1165 Type 2 diabetes mellitus with hyperglycemia: Secondary | ICD-10-CM | POA: Diagnosis not present

## 2022-08-22 DIAGNOSIS — E1149 Type 2 diabetes mellitus with other diabetic neurological complication: Secondary | ICD-10-CM

## 2022-08-25 NOTE — Progress Notes (Signed)
Subjective: Chief Complaint  Patient presents with   Nail Problem    Nail trim     72 year old male presents the above concerns.  70 nails he is doing well no other concerns to his feet.  The nails are causing discomfort as they are elongated he cannot trim them himself.  Georgianne Fick, MD -last seen 08/09/2022   Objective: AAO x3, NAD DP/PT pulses palpable bilaterally, CRT less than 3 seconds Sensation decreased with Semmes Weinstein monofilament Nails are hypertrophic, dystrophic, brittle, discolored, elongated 10. No surrounding redness or drainage. Tenderness nails 1-5 bilaterally. No open lesions or pre-ulcerative lesions are identified today. Dry skin present. Hammertoes  No pain with calf compression, swelling, warmth, erythema  Assessment: Symptomatic onychomycosis, type 2 diabetes with neuropathy  Plan: -All treatment options discussed with the patient including all alternatives, risks, complications.  -Sharply debrided the nails x10 without any complications or bleeding -Continue moisturizer for skin. -Daily foot inspection -Patient encouraged to call the office with any questions, concerns, change in symptoms.   Vivi Barrack DPM

## 2022-08-27 ENCOUNTER — Ambulatory Visit: Payer: Medicare PPO | Admitting: Podiatry

## 2022-09-02 DIAGNOSIS — D4101 Neoplasm of uncertain behavior of right kidney: Secondary | ICD-10-CM | POA: Diagnosis not present

## 2022-09-04 DIAGNOSIS — R488 Other symbolic dysfunctions: Secondary | ICD-10-CM | POA: Diagnosis not present

## 2022-09-05 DIAGNOSIS — D4101 Neoplasm of uncertain behavior of right kidney: Secondary | ICD-10-CM | POA: Diagnosis not present

## 2022-09-05 DIAGNOSIS — C642 Malignant neoplasm of left kidney, except renal pelvis: Secondary | ICD-10-CM | POA: Diagnosis not present

## 2022-09-09 ENCOUNTER — Other Ambulatory Visit: Payer: Self-pay | Admitting: Podiatry

## 2022-09-09 MED ORDER — AMMONIUM LACTATE 12 % EX LOTN: 1.0000 | TOPICAL_LOTION | CUTANEOUS | 2 refills | Status: AC | PRN

## 2022-09-09 NOTE — Progress Notes (Signed)
Refilled requested through fax for Amlactin- sent

## 2022-09-11 DIAGNOSIS — R488 Other symbolic dysfunctions: Secondary | ICD-10-CM | POA: Diagnosis not present

## 2022-09-16 DIAGNOSIS — N184 Chronic kidney disease, stage 4 (severe): Secondary | ICD-10-CM | POA: Diagnosis not present

## 2022-09-19 DIAGNOSIS — R488 Other symbolic dysfunctions: Secondary | ICD-10-CM | POA: Diagnosis not present

## 2022-09-20 DIAGNOSIS — R488 Other symbolic dysfunctions: Secondary | ICD-10-CM | POA: Diagnosis not present

## 2022-09-27 DIAGNOSIS — E872 Acidosis, unspecified: Secondary | ICD-10-CM | POA: Diagnosis not present

## 2022-09-27 DIAGNOSIS — Z905 Acquired absence of kidney: Secondary | ICD-10-CM | POA: Diagnosis not present

## 2022-09-27 DIAGNOSIS — I129 Hypertensive chronic kidney disease with stage 1 through stage 4 chronic kidney disease, or unspecified chronic kidney disease: Secondary | ICD-10-CM | POA: Diagnosis not present

## 2022-09-27 DIAGNOSIS — R609 Edema, unspecified: Secondary | ICD-10-CM | POA: Diagnosis not present

## 2022-09-27 DIAGNOSIS — R809 Proteinuria, unspecified: Secondary | ICD-10-CM | POA: Diagnosis not present

## 2022-09-27 DIAGNOSIS — D631 Anemia in chronic kidney disease: Secondary | ICD-10-CM | POA: Diagnosis not present

## 2022-09-27 DIAGNOSIS — N184 Chronic kidney disease, stage 4 (severe): Secondary | ICD-10-CM | POA: Diagnosis not present

## 2022-09-27 DIAGNOSIS — E875 Hyperkalemia: Secondary | ICD-10-CM | POA: Diagnosis not present

## 2022-09-27 DIAGNOSIS — N2581 Secondary hyperparathyroidism of renal origin: Secondary | ICD-10-CM | POA: Diagnosis not present

## 2022-10-02 DIAGNOSIS — E1122 Type 2 diabetes mellitus with diabetic chronic kidney disease: Secondary | ICD-10-CM | POA: Diagnosis not present

## 2022-10-02 DIAGNOSIS — E782 Mixed hyperlipidemia: Secondary | ICD-10-CM | POA: Diagnosis not present

## 2022-10-02 DIAGNOSIS — N184 Chronic kidney disease, stage 4 (severe): Secondary | ICD-10-CM | POA: Diagnosis not present

## 2022-10-02 DIAGNOSIS — E1165 Type 2 diabetes mellitus with hyperglycemia: Secondary | ICD-10-CM | POA: Diagnosis not present

## 2022-11-04 DIAGNOSIS — E1165 Type 2 diabetes mellitus with hyperglycemia: Secondary | ICD-10-CM | POA: Diagnosis not present

## 2022-11-04 DIAGNOSIS — E1122 Type 2 diabetes mellitus with diabetic chronic kidney disease: Secondary | ICD-10-CM | POA: Diagnosis not present

## 2022-11-04 DIAGNOSIS — N184 Chronic kidney disease, stage 4 (severe): Secondary | ICD-10-CM | POA: Diagnosis not present

## 2022-11-04 DIAGNOSIS — I10 Essential (primary) hypertension: Secondary | ICD-10-CM | POA: Diagnosis not present

## 2022-11-08 DIAGNOSIS — E875 Hyperkalemia: Secondary | ICD-10-CM | POA: Diagnosis not present

## 2022-11-08 DIAGNOSIS — D631 Anemia in chronic kidney disease: Secondary | ICD-10-CM | POA: Diagnosis not present

## 2022-11-08 DIAGNOSIS — R809 Proteinuria, unspecified: Secondary | ICD-10-CM | POA: Diagnosis not present

## 2022-11-08 DIAGNOSIS — I129 Hypertensive chronic kidney disease with stage 1 through stage 4 chronic kidney disease, or unspecified chronic kidney disease: Secondary | ICD-10-CM | POA: Diagnosis not present

## 2022-11-08 DIAGNOSIS — N184 Chronic kidney disease, stage 4 (severe): Secondary | ICD-10-CM | POA: Diagnosis not present

## 2022-11-08 DIAGNOSIS — Z905 Acquired absence of kidney: Secondary | ICD-10-CM | POA: Diagnosis not present

## 2022-11-08 DIAGNOSIS — N2581 Secondary hyperparathyroidism of renal origin: Secondary | ICD-10-CM | POA: Diagnosis not present

## 2022-11-15 DIAGNOSIS — N184 Chronic kidney disease, stage 4 (severe): Secondary | ICD-10-CM | POA: Diagnosis not present

## 2022-11-16 DIAGNOSIS — E1165 Type 2 diabetes mellitus with hyperglycemia: Secondary | ICD-10-CM | POA: Diagnosis not present

## 2022-11-18 ENCOUNTER — Encounter (INDEPENDENT_AMBULATORY_CARE_PROVIDER_SITE_OTHER): Payer: Self-pay

## 2022-11-18 ENCOUNTER — Encounter (INDEPENDENT_AMBULATORY_CARE_PROVIDER_SITE_OTHER): Payer: Medicare PPO | Admitting: Ophthalmology

## 2022-11-18 DIAGNOSIS — E113553 Type 2 diabetes mellitus with stable proliferative diabetic retinopathy, bilateral: Secondary | ICD-10-CM | POA: Diagnosis not present

## 2022-11-28 ENCOUNTER — Ambulatory Visit: Payer: Medicare PPO | Admitting: Podiatry

## 2022-11-28 ENCOUNTER — Encounter: Payer: Self-pay | Admitting: Podiatry

## 2022-11-28 DIAGNOSIS — M79674 Pain in right toe(s): Secondary | ICD-10-CM

## 2022-11-28 DIAGNOSIS — M2042 Other hammer toe(s) (acquired), left foot: Secondary | ICD-10-CM

## 2022-11-28 DIAGNOSIS — M2041 Other hammer toe(s) (acquired), right foot: Secondary | ICD-10-CM | POA: Diagnosis not present

## 2022-11-28 DIAGNOSIS — L84 Corns and callosities: Secondary | ICD-10-CM | POA: Diagnosis not present

## 2022-11-28 DIAGNOSIS — M79675 Pain in left toe(s): Secondary | ICD-10-CM

## 2022-11-28 DIAGNOSIS — B351 Tinea unguium: Secondary | ICD-10-CM | POA: Diagnosis not present

## 2022-11-28 DIAGNOSIS — E1149 Type 2 diabetes mellitus with other diabetic neurological complication: Secondary | ICD-10-CM

## 2022-11-28 NOTE — Progress Notes (Signed)
Subjective: Chief Complaint  Patient presents with   Debridement    Trim toenails/calluses     72 year old male presents the above concerns.  Nails are thickened elongated is not able to trim them himself not causing discomfort.  No open lesions that he reports.  No drainage.  Georgianne Fick, MD -last seen 08/09/2022   Objective: AAO x3, NAD DP/PT pulses palpable bilaterally, CRT less than 3 seconds Sensation decreased with Semmes Weinstein monofilament Nails are hypertrophic, dystrophic, brittle, discolored, elongated 10. No surrounding redness or drainage. Tenderness nails 1-5 bilaterally.  At the distal aspect the right third toe the nail is somewhat loose and there is hyperkeratotic tissue.  There is a preulcerative callus at the distal aspect of the toe without any drainage or pus.  Hammertoes present. Dry skin present. No pain with calf compression, swelling, warmth, erythema  Assessment: Symptomatic onychomycosis, type 2 diabetes with neuropathy  Plan: -All treatment options discussed with the patient including all alternatives, risks, complications.  -Sharply debrided the nails x10 without any complications or bleeding.  Debrided the callus at the distal aspect of the right third toe.  He is to monitor this area closely.  Still keep a small amount of antibiotic ointment dressing changes daily.  Monitor for any signs or symptoms of infection.  Dispensed offloading pad. -Continue moisturizer for skin. -Daily foot inspection -Patient encouraged to call the office with any questions, concerns, change in symptoms.   Vivi Barrack DPM

## 2022-12-02 ENCOUNTER — Encounter: Payer: Self-pay | Admitting: Adult Health

## 2022-12-02 ENCOUNTER — Ambulatory Visit: Payer: Medicare PPO | Admitting: Adult Health

## 2022-12-02 VITALS — BP 119/70 | HR 83 | Ht 76.0 in | Wt 228.0 lb

## 2022-12-02 DIAGNOSIS — G912 (Idiopathic) normal pressure hydrocephalus: Secondary | ICD-10-CM

## 2022-12-02 NOTE — Progress Notes (Signed)
PATIENT: [name] DOB: 1951-02-13  REASON FOR VISIT: follow up HISTORY FROM: patient PRIMARY NEUROLOGIST: Dr. Lucia Gaskins   Chief Complaint  Patient presents with   Follow-up    Pt in 19 Pt here for Memory f/u Pt states short term memory is a better      HISTORY OF PRESENT ILLNESS: Today 12/02/22:  [name] is a 72 y.o. male with a history of possible NPH. Returns today for follow-up.  Overall he feels that his memory is better but still has problems with short-term. Does not feel this hinders him. He is back preaching and visiting.  Denies any changes with his gait or balance.  No falls. Reports that he has a cane that he using for uneven surfaces. Does report urinary incontinence and wears depends. Reports that he is working with his urologist. Only has 1 kidney after removal for renal carcinoma. Continues to see Dr. Marcia Brash for lumboperitoneal shunt   11/28/21: [name] is a 72 year old male with a history of possible normal pressure hydrocephalus.  He returns today for follow-up. Diagnosed with renal Carcinoma had surgery for removal. No chemo or radiation. Feels that he is doing much better.   Uses a cane or rollator. Getting iron infusions and feels that has helped. Did have a fall 2 weeks ago and no inuries. No headaches. No visual changes. Feels that his memory is improving. Wife helps with medications. Able to complete all ADLS independently.   11/23/2020 [name] is a 72 year old male with a history of possible normal pressure hydrocephalus.  He returns today for follow-up.  He was sent to Dr. Johnsie Cancel for lumbar drain.  Patient's wife reports that after the placement of the drain she did notice some improvement but over time she feels that he has gradually worsened again.  She notes that he has lost approximately 20 pounds with weight.  She states that he does not want to eat.  She states that he has no desire to go and do things.  Stays at home most of  the time.  The patient feels that his memory is "crap."  He denies any significant changes with his gait or balance.  He does use a walker.  Returns today for an evaluation.  HISTORY (copied from Dr. Trevor Mace note) Interval history 05/01/2020:MRI brain showed enlarged ventricles very suspicious for NPH but also moderately advanced atrophy. Formal memory testing showed possibly a component of NPH. Large volume tap did not show any patient improvement but given findings on formal memory testing may consider temporary lumbar drain. To complicate more, DAT scan showed possible parkinsonian syndrome which may be affecting his gait. Cannot rule out multiple etiologies. Will send to Neurosurgery for eval of a temporary lumbar drain.    Interval history February 15, 2020: Patient is here today for evaluation after high-volume lumbar puncture for possible normal pressure hydrocephalus. Per wife Mobility is not better. Unclear if cognition is improved but she does not think, patient's wife thinks he "plays crazy" so not sure if he is ignoring her or not. It was "[facility]" getting him here, she told him to wash up and he was acting like he didn;t know how, or how to get dressed it was a battle and she thinks it is on purpose. (Wife playing video games throughout the appointment on her phone).He says he ignores it sometimes but he does not feel he had any improvement in cognition. Wife says he is "up and down" all night. Wife  has "had it". He says he can be "obstinant" and not follow her instructions.    I did discuss with wife even if we treat the NPH he still may need additional care, she has been calling our office recently saying she cannot care for him anymore although nothing acute or subacutely new, patient is going to have to talk to family and primary care and possibly discuss with social work any help that she can get for patient.       HPI:  [name] is a 72 y.o. male here as requested urgently by  Ronald Grippe, MD for confusion and unsteady gait. PMHx hypertension, GERD, gastroparesis, esophageal cancer, diabetes, chronic kidney disease stage III, Barrett esophagus I reviewed Dr. Elmyra Ricks notes, confusion getting worse in the last few months, and steady gait for more than a year but getting worse, impaired hand eye coordination, voice getting weaker especially in the last 3 weeks, difficulty remembering how to find his way to places he wants to go, difficulty walking since February 2020, he has been to physical therapy, he has been falling at least several times over the prior few weeks, dropping things, knocking things over when he reaches for things, high-end eye coordination not as good as it was, trouble remembering names and trouble remembering words, forgetful and confused, he can member where he is going so he is not driving and he cannot remember how to use his cell phone does not always understand what his wife is telling him, he has to be told everything to do to one step at a time.  Also had hypertension and his amlodipine was held at last appointment.  I reviewed labs which included CBC which was unremarkable mild anemia, BMP showed a BUN of 29, creatinine 1.3, decreased GFR, otherwise unremarkable including normal LFTs, ferritin was 31, A1c 7.4, LDL 63, urinalysis did not show infection, all these labs were collected November 02, 2019.     Patient says he is frustrated, "I can't remember crap", wife provides most information, they recently were evicted and things worsened since then so a lot of stress in life. Balance issues ongoing 1-2 years at least and progressively worsening, he says he just all of a sudden falls, he may trip over things, sometimes he just loses his balance and falls he misinterprets where certain places are in the room, he has difficulty with depth perception, He denies significant numbness in the legs or weakness sin the legs. He is walking slow, wife says he is shuffling. No  shaking or tremors. Voice is becoming softer it is hard for him to project. No problem with smell. He is barely eating part may be depression lately, they are in a hotel right now. No active dreams, no significant snoring, he has a FHx of stroke but not dementia in the family. Whole body affected, he can;t sit up straight, when he walks he bends over. He has scoliosis but no new or significant low back or neck pain or radicular symptoms. They deny memory changes except in the last few weeks in the setting of stress he has forgotten how to use his cell phone for example like he is a child. Vision and hearing is fine. Most of the time he can make it to the bathroom.No other focal neurologic deficits, associated symptoms, inciting events or modifiable factors.   Reviewed notes, labs and imaging from outside physicians, which showed:   MRI brain 2009: reviewed images and agree with these findings:  Normal craniovertebral junction.  Diffuse ventricular  dilatation.  Dilatation of the lateral, third, and fourth  ventricles.  The temporal horns are mildly dilated. Mild cerebral  atrophy. There are some atrophic changes involving the cerebellum,  mainly the vermis with enlargement of the folia however I feel that  the fourth ventricular dilatation is disproportionate.     Negative for mass, hemorrhage, or acute/subacute infarction.  Patency of the vertebral, basilar, and internal carotid arteries.     IMPRESSION:  Diffuse ventricular dilatation.  I cannot exclude normal pressure  hydrocephalus. See above comments.    REVIEW OF SYSTEMS: Out of a complete 14 system review of symptoms, the patient complains only of the following symptoms, and all other reviewed systems are negative.  ALLERGIES: Allergies  Allergen Reactions   Duragen [Estradiol Valerate] Other (See Comments)    unknown   Fentanyl Other (See Comments)    hallucinations   Gabapentin Other (See Comments)   Morphine And Codeine  Other (See Comments)    Hallucinations.    HOME MEDICATIONS: Outpatient Medications Prior to Visit  Medication Sig Dispense Refill   acetaminophen (TYLENOL) 500 MG tablet Take 500-1,000 mg by mouth every 6 (six) hours as needed for moderate pain or headache.     amLODipine (NORVASC) 10 MG tablet Take 10 mg by mouth daily.     ammonium lactate (AMLACTIN) 12 % lotion Apply 1 Application topically as needed for dry skin. 400 g 2   Continuous Glucose Receiver (DEXCOM G7 RECEIVER) DEVI by Does not apply route.     Insulin Aspart (NOVOLOG IJ) Inject 6 Units as directed daily.     Insulin Lispro (HUMALOG IJ) Inject as directed.     Multiple Vitamin (MULTIVITAMIN WITH MINERALS) TABS tablet Take 1 tablet by mouth daily. Centrum     omeprazole (PRILOSEC) 40 MG capsule Take 40 mg by mouth 2 (two) times daily.     No facility-administered medications prior to visit.    PAST MEDICAL HISTORY: Past Medical History:  Diagnosis Date   Anemia    Barrett esophagus    Chronic kidney disease (CKD), stage III (moderate) (HCC)    Dementia (HCC)    Depression    Diabetes mellitus (HCC)    Esophageal cancer (HCC)    Gastroparesis    GERD (gastroesophageal reflux disease)    History of MRSA infection    Hydrocephalus (HCC)    Hypertension    Left renal mass 03/15/2021   Lobar pneumonia (HCC) 06/25/2020   Parkinson's disease    Pneumonia    Sepsis secondary to UTI (HCC) 06/24/2020   Sleep apnea    no longer uses a cpap    PAST SURGICAL HISTORY: Past Surgical History:  Procedure Laterality Date   CYST ENTEROSTOMY Left 03/15/2021   Procedure: excision of sebaceous cyst of left lower abdomen;  Surgeon: Crist Fat, MD;  Location: [facility];  Service: Urology;  Laterality: Left;   LAPAROSCOPIC NEPHRECTOMY Left 03/15/2021   Procedure: LEFT LAPAROSCOPIC RADICAL NEPHRECTOMY WITH LYSIS OF ADHESIONS;  Surgeon: Crist Fat, MD;  Location: [facility];  Service: Urology;  Laterality: Left;   Left  VATS, Left thoracoabdominal esophagogastrectomy with jejunostomy and pyloroplasty     04/24/2004 Dr Edwyna Shell   LUMBAR PUNCTURE  02/15/2020   PLACEMENT OF LUMBAR DRAIN N/A 08/14/2020   Procedure: PLACEMENT OF LUMBAR DRAIN;  Surgeon: Jadene Pierini, MD;  Location: [facility] OR;  Service: Neurosurgery;  Laterality: N/A;   R cataract and retinal tears  SHUNT REPLACEMENT N/A 09/11/2020   Procedure: Placement of Lumboperitoneal Shunt;  Surgeon: Jadene Pierini, MD;  Location[facility];  Service: Neurosurgery;  Laterality: N/A;    FAMILY HISTORY: Family History  Problem Relation Age of Onset   Cancer Mother        colon, breast   Heart disease Father    Diabetes Other        FX HX   Stroke Paternal Aunt    Dementia Neg Hx    Parkinson's disease Neg Hx     SOCIAL HISTORY: Social History   Socioeconomic History   Marital status: Married    Spouse name: Not on file   Number of children: Not on file   Years of education: Not on file   Highest education level: Master's degree (e.g., MA, MS, MEng, MEd, MSW, MBA)  Occupational History   Occupation: minister  Tobacco Use   Smoking status: Never   Smokeless tobacco: Never  Vaping Use   Vaping status: Never Used  Substance and Sexual Activity   Alcohol use: Not Currently    Comment: occasionally drinks Bailey's   Drug use: No   Sexual activity: Yes  Other Topics Concern   Not on file  Social History Narrative   Lives at home with wife   Right handed   Social Determinants of Health   Financial Resource Strain: Not on file  Food Insecurity: Not on file  Transportation Needs: Not on file  Physical Activity: Not on file  Stress: Not on file  Social Connections: Not on file  Intimate Partner Violence: Not on file      PHYSICAL EXAM  Vitals:   12/02/22 1036  BP: 119/70  Pulse: 83  Weight: 228 lb (103.4 kg)  Height: 6\' 4"  (1.93 m)    Body mass index is 27.75 kg/m.    12/02/2022   10:38 AM 11/28/2021   12:10 PM  03/17/2020    9:00 AM  MMSE - Mini Mental State Exam  Orientation to time 5 5 1   Orientation to Place 4 5 4   Registration 3 3 3   Attention/ Calculation 1 3 0  Recall 2 2 0  Language- name 2 objects 2 2 2   Language- repeat 1 1 1   Language- follow 3 step command 3 3 3   Language- read & follow direction 1 1 1   Write a sentence 0 1 1  Copy design 0 0 0  Total score 22 26 16      Generalized: Well developed, in no acute distress   Neurological examination  Mentation: Alert oriented to time, place, history taking. Follows all commands speech and language fluent.  Cranial nerve II-XII: Pupils were equal round reactive to light. Extraocular movements were full, visual field were full on confrontational test. Facial sensation and strength were normal.  Head turning and shoulder shrug  were normal and symmetric. Motor: The motor testing reveals 5 over 5 strength of all 4 extremities. Good symmetric motor tone is noted throughout.  Sensory: Sensory testing is intact to soft touch on all 4 extremities. No evidence of extinction is noted.  Coordination: Cerebellar testing reveals good finger-nose-finger and heel-to-shin bilaterally.  Gait: good stride, tandem gait not attempted.    DIAGNOSTIC DATA (LABS, IMAGING, TESTING) - I reviewed patient records, labs, notes, testing and imaging myself where available.  Lab Results  Component Value Date   WBC 8.9 03/08/2022   HGB 9.9 (L) 03/08/2022   HCT 30.7 (L) 03/08/2022   MCV 90.0  03/08/2022   PLT 224 03/08/2022      Component Value Date/Time   NA 135 03/08/2022 1943   NA 137 02/16/2020 1114   K 5.4 (H) 03/08/2022 1943   CL 103 03/08/2022 1943   CO2 22 03/08/2022 1943   GLUCOSE 117 (H) 03/08/2022 1943   BUN 72 (H) 03/08/2022 1943   BUN 45 (H) 02/16/2020 1114   CREATININE 3.12 (H) 03/08/2022 1943   CALCIUM 9.3 03/08/2022 1943   PROT 6.8 03/09/2021 1000   PROT 6.7 02/16/2020 1114   ALBUMIN 3.9 03/09/2021 1000   ALBUMIN 4.4 02/16/2020  1114   AST 14 (L) 03/09/2021 1000   ALT 13 03/09/2021 1000   ALKPHOS 68 03/09/2021 1000   BILITOT 0.9 03/09/2021 1000   BILITOT 1.2 02/16/2020 1114   GFRNONAA 20 (L) 03/08/2022 1943   GFRAA 47 (L) 02/16/2020 1114   Lab Results  Component Value Date   CHOL  08/28/2007    83        ATP III CLASSIFICATION:  <200     mg/dL   Desirable  161-096  mg/dL   Borderline High  >=045    mg/dL   High   HDL 21 (L) 40/98/1191   LDLCALC  08/28/2007    48        Total Cholesterol/HDL:CHD Risk Coronary Heart Disease Risk Table                     Men   Women  1/2 Average Risk   3.4   3.3   TRIG 69 08/28/2007   CHOLHDL 4.0 08/28/2007   Lab Results  Component Value Date   HGBA1C 7.1 (H) 03/09/2021   Lab Results  Component Value Date   VITAMINB12 654 06/25/2020   Lab Results  Component Value Date   TSH 1.290 02/16/2020      ASSESSMENT AND PLAN 72 y.o. year old male  has a past medical history of Anemia, Barrett esophagus, Chronic kidney disease (CKD), stage III (moderate) (HCC), Dementia (HCC), Depression, Diabetes mellitus (HCC), Esophageal cancer (HCC), Gastroparesis, GERD (gastroesophageal reflux disease), History of MRSA infection, Hydrocephalus (HCC), Hypertension, Left renal mass (03/15/2021), Lobar pneumonia (HCC) (06/25/2020), Parkinson's disease, Pneumonia, Sepsis secondary to UTI (HCC) (06/24/2020), and Sleep apnea. here with :   1.  Normal pressure hydrocephalus  -- MMSE score  22/30 previously 26/30.  -- Gait is stable  -- Advised to continue to follow with Dr. Marcia Brash.  -- Patient will follow-up in 1 year or sooner if needed.  Ronald Penny, MSN, NP-C 12/02/2022, 10:37 AM [facility] [address] [place], [place] [address] [phone]

## 2022-12-02 NOTE — Patient Instructions (Signed)
Your Plan:  Continue to monitor memory and walking Call if symptoms worsen or you develop new symptoms. If your symptoms worsen or you develop new symptoms please let us know.    Thank you for coming to see [place] at [facility]. I hope we have been able to provide you high quality care today.  You may receive a patient satisfaction survey over the next few weeks. We would appreciate your feedback and comments so that we may continue to improve ourselves and the health of our patients.

## 2022-12-04 DIAGNOSIS — N184 Chronic kidney disease, stage 4 (severe): Secondary | ICD-10-CM | POA: Diagnosis not present

## 2022-12-19 DIAGNOSIS — I10 Essential (primary) hypertension: Secondary | ICD-10-CM | POA: Diagnosis not present

## 2022-12-19 DIAGNOSIS — E1165 Type 2 diabetes mellitus with hyperglycemia: Secondary | ICD-10-CM | POA: Diagnosis not present

## 2022-12-19 DIAGNOSIS — N184 Chronic kidney disease, stage 4 (severe): Secondary | ICD-10-CM | POA: Diagnosis not present

## 2022-12-20 ENCOUNTER — Other Ambulatory Visit: Payer: Self-pay | Admitting: Urology

## 2022-12-20 DIAGNOSIS — D4101 Neoplasm of uncertain behavior of right kidney: Secondary | ICD-10-CM

## 2022-12-23 DIAGNOSIS — K2100 Gastro-esophageal reflux disease with esophagitis, without bleeding: Secondary | ICD-10-CM | POA: Diagnosis not present

## 2023-02-03 DIAGNOSIS — E1165 Type 2 diabetes mellitus with hyperglycemia: Secondary | ICD-10-CM | POA: Diagnosis not present

## 2023-02-06 ENCOUNTER — Ambulatory Visit: Payer: Medicare HMO | Admitting: Podiatry

## 2023-02-06 DIAGNOSIS — E1165 Type 2 diabetes mellitus with hyperglycemia: Secondary | ICD-10-CM | POA: Diagnosis not present

## 2023-02-06 DIAGNOSIS — I10 Essential (primary) hypertension: Secondary | ICD-10-CM | POA: Diagnosis not present

## 2023-02-07 DIAGNOSIS — Z23 Encounter for immunization: Secondary | ICD-10-CM | POA: Diagnosis not present

## 2023-02-13 ENCOUNTER — Encounter: Payer: Self-pay | Admitting: Podiatry

## 2023-02-13 ENCOUNTER — Ambulatory Visit: Payer: Medicare HMO | Admitting: Podiatry

## 2023-02-13 DIAGNOSIS — B351 Tinea unguium: Secondary | ICD-10-CM | POA: Diagnosis not present

## 2023-02-13 DIAGNOSIS — E1149 Type 2 diabetes mellitus with other diabetic neurological complication: Secondary | ICD-10-CM

## 2023-02-13 DIAGNOSIS — M2042 Other hammer toe(s) (acquired), left foot: Secondary | ICD-10-CM | POA: Diagnosis not present

## 2023-02-13 DIAGNOSIS — L84 Corns and callosities: Secondary | ICD-10-CM | POA: Diagnosis not present

## 2023-02-13 DIAGNOSIS — M2041 Other hammer toe(s) (acquired), right foot: Secondary | ICD-10-CM | POA: Diagnosis not present

## 2023-02-14 ENCOUNTER — Encounter: Payer: Self-pay | Admitting: Podiatry

## 2023-02-14 DIAGNOSIS — E1165 Type 2 diabetes mellitus with hyperglycemia: Secondary | ICD-10-CM | POA: Diagnosis not present

## 2023-02-14 NOTE — Progress Notes (Signed)
Subjective: No chief complaint on file.    72 year old male presents the above concerns.  Nails are thickened elongated is not able to trim them himself not causing discomfort.  No open lesions that he reports.  He does have several preulcerative calluses associated with contracted digits mostly on the right foot.  No drainage.  He states that his A1c has been improved and is somewhere in the 7's.  Georgianne Fick, MD   Objective: AAO x3, NAD DP/PT pulses palpable bilaterally, CRT less than 3 seconds Sensation decreased with Semmes Weinstein monofilament Nails are hypertrophic, dystrophic, brittle, discolored, elongated 10. No surrounding redness or drainage. Tenderness nails 1-5 bilaterally.  At the distal aspect the right third toe the nail is somewhat loose and there is hyperkeratotic tissue.  There is a preulcerative callus at the distal aspect of the toe without any drainage or pus. There is also preulcerative callus to the right 3rd digit. Hyperkeratotic tissue to the plantar tuft of the right hallux. Hammertoes present. The right 2nd and 3rd toes are reducible. Dry skin present. No pain with calf compression, swelling, warmth, erythema  Assessment: Symptomatic onychomycosis, type 2 diabetes with neuropathy  Plan: -All treatment options discussed with the patient including all alternatives, risks, complications.  -Sharply debrided the nails x10 without any complications or bleeding.  Debrided the callus at the distal aspect of the right third toe and 2nd toe.  He is to monitor this area closely.  Still keep a small amount of antibiotic ointment dressing changes daily.  Monitor for any signs or symptoms of infection.  Dispensed offloading crest pad. -Discussed with patient overall concern of developing ulcerations at the site of the affected toes.  We did discuss the possibility of flexor tenotomy at length.  Patient would like to proceed with this at 2 to 4 weeks.  Risk and  benefits were discussed at length.  I do think that he would benefit from this as these toes seem to be reducible and clinically he has good circulation.  His A1c is in acceptable range at this point. -Continue moisturizer for skin. -Daily foot inspection -Patient encouraged to call the office with any questions, concerns, change in symptoms.  -Follow-up in 2 to 4 weeks for flexor tenotomy procedure and office with myself or with Dr. Ouida Sills  Bronwen Betters, DPM

## 2023-02-24 DIAGNOSIS — C642 Malignant neoplasm of left kidney, except renal pelvis: Secondary | ICD-10-CM | POA: Diagnosis not present

## 2023-02-25 ENCOUNTER — Other Ambulatory Visit: Payer: Self-pay | Admitting: Urology

## 2023-02-25 DIAGNOSIS — D4101 Neoplasm of uncertain behavior of right kidney: Secondary | ICD-10-CM

## 2023-02-26 ENCOUNTER — Ambulatory Visit
Admission: RE | Admit: 2023-02-26 | Discharge: 2023-02-26 | Disposition: A | Discharge status: Home / Self Care | Payer: Medicare HMO | Source: Ambulatory Visit | Attending: Urology | Admitting: Urology

## 2023-02-26 DIAGNOSIS — I10 Essential (primary) hypertension: Secondary | ICD-10-CM | POA: Diagnosis not present

## 2023-02-26 DIAGNOSIS — G912 (Idiopathic) normal pressure hydrocephalus: Secondary | ICD-10-CM | POA: Diagnosis not present

## 2023-02-26 DIAGNOSIS — R5383 Other fatigue: Secondary | ICD-10-CM | POA: Diagnosis not present

## 2023-02-26 DIAGNOSIS — N184 Chronic kidney disease, stage 4 (severe): Secondary | ICD-10-CM | POA: Diagnosis not present

## 2023-02-26 DIAGNOSIS — D4101 Neoplasm of uncertain behavior of right kidney: Secondary | ICD-10-CM

## 2023-02-26 DIAGNOSIS — E782 Mixed hyperlipidemia: Secondary | ICD-10-CM | POA: Diagnosis not present

## 2023-02-26 DIAGNOSIS — Z905 Acquired absence of kidney: Secondary | ICD-10-CM | POA: Diagnosis not present

## 2023-02-26 DIAGNOSIS — R269 Unspecified abnormalities of gait and mobility: Secondary | ICD-10-CM | POA: Diagnosis not present

## 2023-02-26 DIAGNOSIS — K449 Diaphragmatic hernia without obstruction or gangrene: Secondary | ICD-10-CM | POA: Diagnosis not present

## 2023-02-26 DIAGNOSIS — E1165 Type 2 diabetes mellitus with hyperglycemia: Secondary | ICD-10-CM | POA: Diagnosis not present

## 2023-02-26 DIAGNOSIS — F0390 Unspecified dementia without behavioral disturbance: Secondary | ICD-10-CM | POA: Diagnosis not present

## 2023-02-26 DIAGNOSIS — N281 Cyst of kidney, acquired: Secondary | ICD-10-CM | POA: Diagnosis not present

## 2023-02-26 MED ORDER — GADOPICLENOL 0.5 MMOL/ML IV SOLN
10.0000 mL | Freq: Once | INTRAVENOUS | Status: AC | PRN
  Administered 2023-02-26: 10 mL via INTRAVENOUS

## 2023-03-05 DIAGNOSIS — N2581 Secondary hyperparathyroidism of renal origin: Secondary | ICD-10-CM | POA: Diagnosis not present

## 2023-03-05 DIAGNOSIS — Z905 Acquired absence of kidney: Secondary | ICD-10-CM | POA: Diagnosis not present

## 2023-03-05 DIAGNOSIS — E875 Hyperkalemia: Secondary | ICD-10-CM | POA: Diagnosis not present

## 2023-03-05 DIAGNOSIS — N184 Chronic kidney disease, stage 4 (severe): Secondary | ICD-10-CM | POA: Diagnosis not present

## 2023-03-05 DIAGNOSIS — D631 Anemia in chronic kidney disease: Secondary | ICD-10-CM | POA: Diagnosis not present

## 2023-03-05 DIAGNOSIS — I129 Hypertensive chronic kidney disease with stage 1 through stage 4 chronic kidney disease, or unspecified chronic kidney disease: Secondary | ICD-10-CM | POA: Diagnosis not present

## 2023-03-06 ENCOUNTER — Encounter: Payer: Self-pay | Admitting: Podiatry

## 2023-03-06 ENCOUNTER — Ambulatory Visit: Payer: Medicare HMO | Admitting: Podiatry

## 2023-03-06 DIAGNOSIS — R12 Heartburn: Secondary | ICD-10-CM | POA: Diagnosis not present

## 2023-03-06 DIAGNOSIS — L84 Corns and callosities: Secondary | ICD-10-CM | POA: Diagnosis not present

## 2023-03-06 DIAGNOSIS — M2041 Other hammer toe(s) (acquired), right foot: Secondary | ICD-10-CM

## 2023-03-06 DIAGNOSIS — M2042 Other hammer toe(s) (acquired), left foot: Secondary | ICD-10-CM | POA: Diagnosis not present

## 2023-03-06 DIAGNOSIS — K2100 Gastro-esophageal reflux disease with esophagitis, without bleeding: Secondary | ICD-10-CM | POA: Diagnosis not present

## 2023-03-06 DIAGNOSIS — K317 Polyp of stomach and duodenum: Secondary | ICD-10-CM | POA: Diagnosis not present

## 2023-03-06 DIAGNOSIS — Z980 Intestinal bypass and anastomosis status: Secondary | ICD-10-CM | POA: Diagnosis not present

## 2023-03-06 DIAGNOSIS — D131 Benign neoplasm of stomach: Secondary | ICD-10-CM | POA: Diagnosis not present

## 2023-03-09 NOTE — Progress Notes (Signed)
Subjective: Chief Complaint  Patient presents with   Nail Problem    PATIENT STATES THAT EVERYTHING IS FINE    72 year old male presents the office today for concerns of a callus at the tip of his toe.  Seen in the office couple weeks ago and was recommended for further evaluation to make sure nothing to be done to the toe he states.  He has no pain of the toe he denies any swelling redness or drainage.  No open lesions.  No other concerns.  Objective: AAO x3, NAD DP/PT pulses palpable bilaterally, CRT less than 3 seconds Digital contractures noted.  There is mild hyperkeratotic lesion the distal aspect of the right third toe.  There is no underlying ulceration, drainage or signs of infection. No pain with calf compression, swelling, warmth, erythema  Assessment: Hammertoe deformity  Plan: -All treatment options discussed with the patient including all alternatives, risks, complications.  -We The conservative as well as surgical options.  Conservative discussed continue shoe modifications, offloading to avoid excess pressure.  Can consider surgical invention the future but we both mutually agree to hold off on this today we will continue with conservative treatment.  Should symptoms worsen or progress then we will reconsider surgical intervention. -Daily foot inspection.  -Patient encouraged to call the office with any questions, concerns, change in symptoms.   Vivi Barrack DPM

## 2023-03-24 DIAGNOSIS — E1165 Type 2 diabetes mellitus with hyperglycemia: Secondary | ICD-10-CM | POA: Diagnosis not present

## 2023-03-24 DIAGNOSIS — Z794 Long term (current) use of insulin: Secondary | ICD-10-CM | POA: Diagnosis not present

## 2023-03-24 DIAGNOSIS — N2581 Secondary hyperparathyroidism of renal origin: Secondary | ICD-10-CM | POA: Diagnosis not present

## 2023-03-24 DIAGNOSIS — Z0000 Encounter for general adult medical examination without abnormal findings: Secondary | ICD-10-CM | POA: Diagnosis not present

## 2023-03-24 DIAGNOSIS — E0590 Thyrotoxicosis, unspecified without thyrotoxic crisis or storm: Secondary | ICD-10-CM | POA: Diagnosis not present

## 2023-03-27 DIAGNOSIS — C642 Malignant neoplasm of left kidney, except renal pelvis: Secondary | ICD-10-CM | POA: Diagnosis not present

## 2023-03-31 DIAGNOSIS — N184 Chronic kidney disease, stage 4 (severe): Secondary | ICD-10-CM | POA: Diagnosis not present

## 2023-04-07 DIAGNOSIS — K2100 Gastro-esophageal reflux disease with esophagitis, without bleeding: Secondary | ICD-10-CM | POA: Diagnosis not present

## 2023-04-22 DIAGNOSIS — E0590 Thyrotoxicosis, unspecified without thyrotoxic crisis or storm: Secondary | ICD-10-CM | POA: Diagnosis not present

## 2023-04-28 DIAGNOSIS — N184 Chronic kidney disease, stage 4 (severe): Secondary | ICD-10-CM | POA: Diagnosis not present

## 2023-05-06 DIAGNOSIS — I10 Essential (primary) hypertension: Secondary | ICD-10-CM | POA: Diagnosis not present

## 2023-05-06 DIAGNOSIS — N184 Chronic kidney disease, stage 4 (severe): Secondary | ICD-10-CM | POA: Diagnosis not present

## 2023-05-06 DIAGNOSIS — E1122 Type 2 diabetes mellitus with diabetic chronic kidney disease: Secondary | ICD-10-CM | POA: Diagnosis not present

## 2023-05-06 DIAGNOSIS — E1165 Type 2 diabetes mellitus with hyperglycemia: Secondary | ICD-10-CM | POA: Diagnosis not present

## 2023-05-07 DIAGNOSIS — K2100 Gastro-esophageal reflux disease with esophagitis, without bleeding: Secondary | ICD-10-CM | POA: Diagnosis not present

## 2023-05-08 DIAGNOSIS — N184 Chronic kidney disease, stage 4 (severe): Secondary | ICD-10-CM | POA: Diagnosis not present

## 2023-05-09 DIAGNOSIS — Z905 Acquired absence of kidney: Secondary | ICD-10-CM | POA: Diagnosis not present

## 2023-05-09 DIAGNOSIS — E875 Hyperkalemia: Secondary | ICD-10-CM | POA: Diagnosis not present

## 2023-05-09 DIAGNOSIS — N2581 Secondary hyperparathyroidism of renal origin: Secondary | ICD-10-CM | POA: Diagnosis not present

## 2023-05-09 DIAGNOSIS — D631 Anemia in chronic kidney disease: Secondary | ICD-10-CM | POA: Diagnosis not present

## 2023-05-09 DIAGNOSIS — N189 Chronic kidney disease, unspecified: Secondary | ICD-10-CM | POA: Diagnosis not present

## 2023-05-09 DIAGNOSIS — N184 Chronic kidney disease, stage 4 (severe): Secondary | ICD-10-CM | POA: Diagnosis not present

## 2023-05-09 DIAGNOSIS — I129 Hypertensive chronic kidney disease with stage 1 through stage 4 chronic kidney disease, or unspecified chronic kidney disease: Secondary | ICD-10-CM | POA: Diagnosis not present

## 2023-05-15 DIAGNOSIS — E1165 Type 2 diabetes mellitus with hyperglycemia: Secondary | ICD-10-CM | POA: Diagnosis not present

## 2023-05-20 DIAGNOSIS — K2100 Gastro-esophageal reflux disease with esophagitis, without bleeding: Secondary | ICD-10-CM | POA: Diagnosis not present

## 2023-05-20 DIAGNOSIS — R12 Heartburn: Secondary | ICD-10-CM | POA: Diagnosis not present

## 2023-05-28 DIAGNOSIS — E0590 Thyrotoxicosis, unspecified without thyrotoxic crisis or storm: Secondary | ICD-10-CM | POA: Diagnosis not present

## 2023-05-28 DIAGNOSIS — E1165 Type 2 diabetes mellitus with hyperglycemia: Secondary | ICD-10-CM | POA: Diagnosis not present

## 2023-05-28 DIAGNOSIS — Z794 Long term (current) use of insulin: Secondary | ICD-10-CM | POA: Diagnosis not present

## 2023-05-28 DIAGNOSIS — N2581 Secondary hyperparathyroidism of renal origin: Secondary | ICD-10-CM | POA: Diagnosis not present

## 2023-06-12 ENCOUNTER — Ambulatory Visit: Payer: Medicare HMO | Admitting: Podiatry

## 2023-06-12 ENCOUNTER — Encounter: Payer: Self-pay | Admitting: Podiatry

## 2023-06-12 DIAGNOSIS — B351 Tinea unguium: Secondary | ICD-10-CM

## 2023-06-12 DIAGNOSIS — M79675 Pain in left toe(s): Secondary | ICD-10-CM

## 2023-06-12 DIAGNOSIS — M79674 Pain in right toe(s): Secondary | ICD-10-CM

## 2023-06-12 DIAGNOSIS — E1149 Type 2 diabetes mellitus with other diabetic neurological complication: Secondary | ICD-10-CM

## 2023-06-12 DIAGNOSIS — L84 Corns and callosities: Secondary | ICD-10-CM

## 2023-06-12 NOTE — Progress Notes (Signed)
Subjective: Chief Complaint  Patient presents with   [facility]   Callouses    RM#11 DFC/ Calluses right big toe is the worse one bothering patient causing pain.    73 year old male presents the office today for concerns of a callus at the tip of his toe as well as for thick, elongated nails is not able to trim himself.  No swelling redness or drainage.  No open lesions.  He has no other concerns today.  Last A1c he reports was 6.4  Objective: AAO x3, NAD DP/PT pulses palpable bilaterally, CRT less than 3 seconds Digital contractures noted.  There is thick hyperkeratotic lesion the distal aspect of the hallux as well as hyperkeratotic tissue of the right third toe.  There is no underlying ulceration, drainage or signs of infection. Nails are hypertrophic, dystrophic, brittle, discolored, elongated 10. No surrounding redness or drainage. Tenderness nails 1-5 bilaterally.  Digital contractures noted. No pain with calf compression, swelling, warmth, erythema  Assessment: Hammertoe deformity  Plan: -All treatment options discussed with the patient including all alternatives, risks, complications.  -Sharply debrided nails x 10 without any complications or bleeding. -Sharply debrided hyperkeratotic lesions x 2 without any complications or bleeding. -Daily foot inspection.  -Patient encouraged to call the office with any questions, concerns, change in symptoms.   Vivi Barrack DPM

## 2023-06-18 DIAGNOSIS — K2100 Gastro-esophageal reflux disease with esophagitis, without bleeding: Secondary | ICD-10-CM | POA: Diagnosis not present

## 2023-07-03 DIAGNOSIS — Z905 Acquired absence of kidney: Secondary | ICD-10-CM | POA: Diagnosis not present

## 2023-07-03 DIAGNOSIS — E872 Acidosis, unspecified: Secondary | ICD-10-CM | POA: Diagnosis not present

## 2023-07-03 DIAGNOSIS — E875 Hyperkalemia: Secondary | ICD-10-CM | POA: Diagnosis not present

## 2023-07-03 DIAGNOSIS — R609 Edema, unspecified: Secondary | ICD-10-CM | POA: Diagnosis not present

## 2023-07-03 DIAGNOSIS — N184 Chronic kidney disease, stage 4 (severe): Secondary | ICD-10-CM | POA: Diagnosis not present

## 2023-07-03 DIAGNOSIS — N2581 Secondary hyperparathyroidism of renal origin: Secondary | ICD-10-CM | POA: Diagnosis not present

## 2023-07-03 DIAGNOSIS — I129 Hypertensive chronic kidney disease with stage 1 through stage 4 chronic kidney disease, or unspecified chronic kidney disease: Secondary | ICD-10-CM | POA: Diagnosis not present

## 2023-07-03 DIAGNOSIS — D631 Anemia in chronic kidney disease: Secondary | ICD-10-CM | POA: Diagnosis not present

## 2023-07-10 ENCOUNTER — Other Ambulatory Visit: Payer: Medicare HMO

## 2023-08-04 DIAGNOSIS — E1165 Type 2 diabetes mellitus with hyperglycemia: Secondary | ICD-10-CM | POA: Diagnosis not present

## 2023-08-04 DIAGNOSIS — E1122 Type 2 diabetes mellitus with diabetic chronic kidney disease: Secondary | ICD-10-CM | POA: Diagnosis not present

## 2023-08-11 ENCOUNTER — Ambulatory Visit: Payer: Medicare HMO

## 2023-08-11 DIAGNOSIS — M2141 Flat foot [pes planus] (acquired), right foot: Secondary | ICD-10-CM

## 2023-08-11 DIAGNOSIS — E1149 Type 2 diabetes mellitus with other diabetic neurological complication: Secondary | ICD-10-CM

## 2023-08-11 DIAGNOSIS — Z872 Personal history of diseases of the skin and subcutaneous tissue: Secondary | ICD-10-CM

## 2023-08-11 DIAGNOSIS — M2041 Other hammer toe(s) (acquired), right foot: Secondary | ICD-10-CM

## 2023-08-11 DIAGNOSIS — L84 Corns and callosities: Secondary | ICD-10-CM

## 2023-08-11 NOTE — Progress Notes (Signed)
 Patient presents to the office today for diabetic shoe and insole measuring.  Patient was measured with brannock device to determine size and width for 1 pair of extra depth shoes and foam casted for 3 pair of insoles.   Documentation of medical necessity will be sent to patient's treating diabetic doctor to verify and sign.   Patient's diabetic provider:Ramachandran, Ajith MD  Shoes and insoles will be ordered at that time and patient will be notified for an appointment for fitting when they arrive.   Shoe size (per patient): 16 Shoe choice:   1270M / [id number] Shoe size ordered: 16W  Ppw abn signed

## 2023-08-15 DIAGNOSIS — N184 Chronic kidney disease, stage 4 (severe): Secondary | ICD-10-CM | POA: Diagnosis not present

## 2023-08-21 DIAGNOSIS — E113553 Type 2 diabetes mellitus with stable proliferative diabetic retinopathy, bilateral: Secondary | ICD-10-CM | POA: Diagnosis not present

## 2023-08-21 DIAGNOSIS — H35371 Puckering of macula, right eye: Secondary | ICD-10-CM | POA: Diagnosis not present

## 2023-08-28 DIAGNOSIS — I129 Hypertensive chronic kidney disease with stage 1 through stage 4 chronic kidney disease, or unspecified chronic kidney disease: Secondary | ICD-10-CM | POA: Diagnosis not present

## 2023-08-28 DIAGNOSIS — R609 Edema, unspecified: Secondary | ICD-10-CM | POA: Diagnosis not present

## 2023-08-28 DIAGNOSIS — D631 Anemia in chronic kidney disease: Secondary | ICD-10-CM | POA: Diagnosis not present

## 2023-08-28 DIAGNOSIS — N2581 Secondary hyperparathyroidism of renal origin: Secondary | ICD-10-CM | POA: Diagnosis not present

## 2023-08-28 DIAGNOSIS — E875 Hyperkalemia: Secondary | ICD-10-CM | POA: Diagnosis not present

## 2023-08-28 DIAGNOSIS — N184 Chronic kidney disease, stage 4 (severe): Secondary | ICD-10-CM | POA: Diagnosis not present

## 2023-08-28 DIAGNOSIS — N189 Chronic kidney disease, unspecified: Secondary | ICD-10-CM | POA: Diagnosis not present

## 2023-08-28 DIAGNOSIS — Z905 Acquired absence of kidney: Secondary | ICD-10-CM | POA: Diagnosis not present

## 2023-09-11 ENCOUNTER — Ambulatory Visit (INDEPENDENT_AMBULATORY_CARE_PROVIDER_SITE_OTHER): Payer: Medicare HMO | Admitting: Podiatry

## 2023-09-11 ENCOUNTER — Encounter: Payer: Self-pay | Admitting: Podiatry

## 2023-09-11 DIAGNOSIS — L84 Corns and callosities: Secondary | ICD-10-CM | POA: Diagnosis not present

## 2023-09-11 DIAGNOSIS — M79674 Pain in right toe(s): Secondary | ICD-10-CM

## 2023-09-11 DIAGNOSIS — E1149 Type 2 diabetes mellitus with other diabetic neurological complication: Secondary | ICD-10-CM

## 2023-09-11 DIAGNOSIS — L97522 Non-pressure chronic ulcer of other part of left foot with fat layer exposed: Secondary | ICD-10-CM

## 2023-09-11 DIAGNOSIS — M2141 Flat foot [pes planus] (acquired), right foot: Secondary | ICD-10-CM | POA: Diagnosis not present

## 2023-09-11 DIAGNOSIS — M2042 Other hammer toe(s) (acquired), left foot: Secondary | ICD-10-CM | POA: Diagnosis not present

## 2023-09-11 DIAGNOSIS — M2041 Other hammer toe(s) (acquired), right foot: Secondary | ICD-10-CM | POA: Diagnosis not present

## 2023-09-11 DIAGNOSIS — M2142 Flat foot [pes planus] (acquired), left foot: Secondary | ICD-10-CM

## 2023-09-11 DIAGNOSIS — M79675 Pain in left toe(s): Secondary | ICD-10-CM

## 2023-09-11 DIAGNOSIS — B351 Tinea unguium: Secondary | ICD-10-CM

## 2023-09-11 DIAGNOSIS — Z872 Personal history of diseases of the skin and subcutaneous tissue: Secondary | ICD-10-CM

## 2023-09-11 NOTE — Progress Notes (Signed)

## 2023-09-11 NOTE — Progress Notes (Signed)
 Subjective: Chief Complaint  Patient presents with   [facility]    RM#13 [facility] patient states he has no concerns at this time.     73 year old male presents the office today for concerns of a callus and for elongated nails is not able to trim himself.  No swelling redness or drainage.  No open lesions.  He has no other concerns today.  Last A1c he reports was 6.4  Objective: AAO x3, NAD DP/PT pulses palpable bilaterally, CRT less than 3 seconds Digital contractures noted.  There is thick hyperkeratotic lesion the distal aspect of the hallux as well as hyperkeratotic tissue of the right third toe.  Today there is also thick hyperkeratotic tissue lateral aspect left fifth toe which appears to be an old blister.  Under this there is a granular wound present measuring 0.5 x 0.2 x 0.1 cm.  There is no probing, undermining or tunneling.  There is no surrounding erythema, ascending cellulitis.  No fluctuation or crepitation.  No malodor.  Nails are hypertrophic, dystrophic, brittle, discolored, elongated 10. No surrounding redness or drainage. Tenderness nails 1-5 bilaterally.  Digital contractures noted. No pain with calf compression, swelling, warmth, erythema  Assessment: Hammertoe deformity resulting hyperkeratotic lesions, ulcerations left fifth toe, symptomatic onychosis  Plan: -All treatment options discussed with the patient including all alternatives, risks, complications.  -Sharply debrided nails x 10 without any complications or bleeding. -Sharply debrided hyperkeratotic lesions x 2 without any complications or bleeding. -Separate debridement of callus on the left fifth toe any complications or bleeding revealed underlying ulceration.  Medically necessary wound debridement performed today.  Sharply debrided the wound down to healthy, granular tissue border promote wound healing.  Cleaned the area with saline.  Not able to measure the wound prior to debridement but the ulcer measures noted  above.  Antibiotic ointment was applied followed by dressing.  Continue daily dressing changes, offloading.  No blood loss ENT tolerated well.  Monitor for any clinical signs or symptoms of infection and directed to call the office immediately should any occur or go to the ER.  Charity Conch DPM

## 2023-09-11 NOTE — Patient Instructions (Signed)
 Wash the wound with soap and water , dry well. Apply a small amount of mupirocin  ointment followed by dressing daily.  If not healing next 1 to 2 weeks please let me know or sooner if any issues are to arise.  Monitor for any signs/symptoms of infection. Call the office immediately if any occur or go directly to the emergency room. Call with any questions/concerns.

## 2023-09-29 DIAGNOSIS — Z794 Long term (current) use of insulin: Secondary | ICD-10-CM | POA: Diagnosis not present

## 2023-09-29 DIAGNOSIS — E11649 Type 2 diabetes mellitus with hypoglycemia without coma: Secondary | ICD-10-CM | POA: Diagnosis not present

## 2023-09-29 DIAGNOSIS — G912 (Idiopathic) normal pressure hydrocephalus: Secondary | ICD-10-CM | POA: Diagnosis not present

## 2023-09-29 DIAGNOSIS — E1165 Type 2 diabetes mellitus with hyperglycemia: Secondary | ICD-10-CM | POA: Diagnosis not present

## 2023-09-29 DIAGNOSIS — N2581 Secondary hyperparathyroidism of renal origin: Secondary | ICD-10-CM | POA: Diagnosis not present

## 2023-10-06 DIAGNOSIS — E1165 Type 2 diabetes mellitus with hyperglycemia: Secondary | ICD-10-CM | POA: Diagnosis not present

## 2023-10-06 DIAGNOSIS — N184 Chronic kidney disease, stage 4 (severe): Secondary | ICD-10-CM | POA: Diagnosis not present

## 2023-10-06 DIAGNOSIS — E782 Mixed hyperlipidemia: Secondary | ICD-10-CM | POA: Diagnosis not present

## 2023-10-06 DIAGNOSIS — E0590 Thyrotoxicosis, unspecified without thyrotoxic crisis or storm: Secondary | ICD-10-CM | POA: Diagnosis not present

## 2023-10-06 DIAGNOSIS — Z794 Long term (current) use of insulin: Secondary | ICD-10-CM | POA: Diagnosis not present

## 2023-10-06 DIAGNOSIS — N2581 Secondary hyperparathyroidism of renal origin: Secondary | ICD-10-CM | POA: Diagnosis not present

## 2023-10-06 DIAGNOSIS — M542 Cervicalgia: Secondary | ICD-10-CM | POA: Diagnosis not present

## 2023-10-06 DIAGNOSIS — G912 (Idiopathic) normal pressure hydrocephalus: Secondary | ICD-10-CM | POA: Diagnosis not present

## 2023-10-06 DIAGNOSIS — I10 Essential (primary) hypertension: Secondary | ICD-10-CM | POA: Diagnosis not present

## 2023-10-06 DIAGNOSIS — R269 Unspecified abnormalities of gait and mobility: Secondary | ICD-10-CM | POA: Diagnosis not present

## 2023-10-31 DIAGNOSIS — N2581 Secondary hyperparathyroidism of renal origin: Secondary | ICD-10-CM | POA: Diagnosis not present

## 2023-10-31 DIAGNOSIS — E1165 Type 2 diabetes mellitus with hyperglycemia: Secondary | ICD-10-CM | POA: Diagnosis not present

## 2023-10-31 DIAGNOSIS — N184 Chronic kidney disease, stage 4 (severe): Secondary | ICD-10-CM | POA: Diagnosis not present

## 2023-10-31 DIAGNOSIS — Z794 Long term (current) use of insulin: Secondary | ICD-10-CM | POA: Diagnosis not present

## 2023-10-31 DIAGNOSIS — G912 (Idiopathic) normal pressure hydrocephalus: Secondary | ICD-10-CM | POA: Diagnosis not present

## 2023-10-31 DIAGNOSIS — M542 Cervicalgia: Secondary | ICD-10-CM | POA: Diagnosis not present

## 2023-11-04 DIAGNOSIS — E1165 Type 2 diabetes mellitus with hyperglycemia: Secondary | ICD-10-CM | POA: Diagnosis not present

## 2023-11-05 DIAGNOSIS — M542 Cervicalgia: Secondary | ICD-10-CM | POA: Diagnosis not present

## 2023-11-20 ENCOUNTER — Ambulatory Visit: Payer: Medicare PPO | Admitting: Adult Health

## 2023-11-26 ENCOUNTER — Ambulatory Visit: Admitting: Adult Health

## 2023-11-26 VITALS — BP 120/75 | HR 91 | Ht 73.0 in | Wt 225.4 lb

## 2023-11-26 DIAGNOSIS — G912 (Idiopathic) normal pressure hydrocephalus: Secondary | ICD-10-CM
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
## 2023-12-01 ENCOUNTER — Encounter: Payer: Self-pay | Admitting: Podiatrist

## 2023-12-01 ENCOUNTER — Ambulatory Visit (INDEPENDENT_AMBULATORY_CARE_PROVIDER_SITE_OTHER): Admitting: Podiatrist

## 2023-12-01 VITALS — Ht 73.0 in | Wt 225.4 lb

## 2023-12-01 DIAGNOSIS — E1165 Type 2 diabetes mellitus with hyperglycemia: Secondary | ICD-10-CM | POA: Diagnosis not present

## 2023-12-01 DIAGNOSIS — S91119A Laceration without foreign body of unspecified toe without damage to nail, initial encounter: Secondary | ICD-10-CM | POA: Diagnosis not present

## 2023-12-01 NOTE — Patient Instructions (Signed)
 Purchase polysporin or neosporin and apply to pinky toe of the left foot twice a day for 7 days. If the toe gets red, swollen, painful or starts to drain, please let us  know.

## 2023-12-01 NOTE — Progress Notes (Unsigned)
 Left fifth toe had a piece of dead skin.  Ok Subjective: Chief Complaint  Patient presents with   Toe Injury    Pt is here due to cut on his left pinky toe, he states yesterday he was cutting some skin off the toe cut himself and was bleeding, he is a diabetic so he wanted to get treated.     73 year old male presents the office today for concerns as listed above. He relates he cut his skin accidentally and wanted to make sure it was OK>    No swelling redness or drainage.  No open lesions.  He has no other concerns today.    Objective: AAO x3, NAD DP/PT pulses palpable bilaterally, CRT less than 3 seconds Digital contractures noted.    Today there is also thick hyperkeratotic tissue lateral aspect left fifth toe where it does appear he pulled a piece of skin away and it caused a small wound.  The wound that was present at his last visit however, appears resolved.  There is no probing, undermining or tunneling.  There is no surrounding erythema, ascending cellulitis.  No fluctuation or crepitation.  No malodor.   Digital contractures noted. No pain with calf compression, swelling, warmth, erythema  Assessment:   ICD-10-CM   1. Laceration of skin of toe, initial encounter  S91.119A        Plan: -All treatment options discussed with the patient including all alternatives, risks, complications.  - debridement of callus on the left fifth toe any complications or bleeding.  Skin lacreation of toe appears stable.  No debridement required.   Antibiotic ointment was applied followed by dressing.  Continue daily dressing changes, offloading.   Monitor for any clinical signs or symptoms of infection and directed to call the office immediately should any occur or go to the ER.  Lamarr SHAUNNA Salen DPM

## 2023-12-11 ENCOUNTER — Ambulatory Visit: Admitting: Podiatry

## 2023-12-15 ENCOUNTER — Ambulatory Visit: Admitting: Podiatry

## 2023-12-15 DIAGNOSIS — M79675 Pain in left toe(s): Secondary | ICD-10-CM | POA: Diagnosis not present

## 2023-12-15 DIAGNOSIS — E1149 Type 2 diabetes mellitus with other diabetic neurological complication: Secondary | ICD-10-CM | POA: Diagnosis not present

## 2023-12-15 DIAGNOSIS — B351 Tinea unguium: Secondary | ICD-10-CM

## 2023-12-15 DIAGNOSIS — M79674 Pain in right toe(s): Secondary | ICD-10-CM | POA: Diagnosis not present

## 2023-12-15 DIAGNOSIS — L84 Corns and callosities: Secondary | ICD-10-CM

## 2023-12-17 NOTE — Progress Notes (Signed)
 Subjective: Chief Complaint  Patient presents with   Nail Problem    Nail trim      73 year old male presents the office today for concerns of a callus and for elongated nails is not able to trim himself.  States he did have a cut to his skin that he tried doing some skin off and was seen in the office.  This is resolved.  No swelling redness or drainage.  No open lesions.  He has no other concerns today.  Last A1c he reports was 6.4  Objective: AAO x3, NAD DP/PT pulses palpable bilaterally, CRT less than 3 seconds Sensation decreased. Digital contractures noted.   Nails are hypertrophic, dystrophic, brittle, discolored, elongated 10. No surrounding redness or drainage. Tenderness nails 1-5 bilaterally.  Digital contractures noted.  This resulted in hyperkeratotic lesions to the distal aspect of the fifth toe as well as the hallux. No pain with calf compression, swelling, warmth, erythema  Assessment: Mycosis  Plan: -All treatment options discussed with the patient including all alternatives, risks, complications.  -Sharply debrided nails x 10 without any complications or bleeding. -Debrided hyperkeratotic lesions without any complications or bleeding.  Moisturizer, offloading. - Daily foot inspection.  Return in about 3 months (around 03/16/2024).  Donnice JONELLE Fees DPM

## 2024-01-12 DIAGNOSIS — E1165 Type 2 diabetes mellitus with hyperglycemia: Secondary | ICD-10-CM | POA: Diagnosis not present

## 2024-01-13 DIAGNOSIS — R609 Edema, unspecified: Secondary | ICD-10-CM | POA: Diagnosis not present

## 2024-01-13 DIAGNOSIS — E875 Hyperkalemia: Secondary | ICD-10-CM | POA: Diagnosis not present

## 2024-01-13 DIAGNOSIS — N184 Chronic kidney disease, stage 4 (severe): Secondary | ICD-10-CM | POA: Diagnosis not present

## 2024-01-13 DIAGNOSIS — D631 Anemia in chronic kidney disease: Secondary | ICD-10-CM | POA: Diagnosis not present

## 2024-01-13 DIAGNOSIS — E559 Vitamin D deficiency, unspecified: Secondary | ICD-10-CM | POA: Diagnosis not present

## 2024-01-13 DIAGNOSIS — N2581 Secondary hyperparathyroidism of renal origin: Secondary | ICD-10-CM | POA: Diagnosis not present

## 2024-01-13 DIAGNOSIS — I129 Hypertensive chronic kidney disease with stage 1 through stage 4 chronic kidney disease, or unspecified chronic kidney disease: Secondary | ICD-10-CM | POA: Diagnosis not present

## 2024-01-13 DIAGNOSIS — E872 Acidosis, unspecified: Secondary | ICD-10-CM | POA: Diagnosis not present

## 2024-01-13 DIAGNOSIS — Z905 Acquired absence of kidney: Secondary | ICD-10-CM | POA: Diagnosis not present

## 2024-01-22 ENCOUNTER — Other Ambulatory Visit: Payer: Self-pay | Admitting: Urology

## 2024-01-22 DIAGNOSIS — D4101 Neoplasm of uncertain behavior of right kidney: Secondary | ICD-10-CM

## 2024-03-15 DIAGNOSIS — C642 Malignant neoplasm of left kidney, except renal pelvis: Secondary | ICD-10-CM | POA: Diagnosis not present

## 2024-03-16 ENCOUNTER — Ambulatory Visit: Admitting: Podiatry

## 2024-03-16 ENCOUNTER — Encounter: Payer: Self-pay | Admitting: Podiatry

## 2024-03-16 DIAGNOSIS — M79674 Pain in right toe(s): Secondary | ICD-10-CM | POA: Diagnosis not present

## 2024-03-16 DIAGNOSIS — B351 Tinea unguium: Secondary | ICD-10-CM

## 2024-03-16 DIAGNOSIS — M79675 Pain in left toe(s): Secondary | ICD-10-CM | POA: Diagnosis not present

## 2024-03-16 DIAGNOSIS — E1149 Type 2 diabetes mellitus with other diabetic neurological complication: Secondary | ICD-10-CM

## 2024-03-16 DIAGNOSIS — L84 Corns and callosities: Secondary | ICD-10-CM | POA: Diagnosis not present

## 2024-03-16 NOTE — Progress Notes (Signed)
 Subjective: Chief Complaint  Patient presents with   [facility]    Rm13 Diabetic foot care/ A42c 75    73 year old male presents the office today for concerns of a callus and for elongated nails is not able to trim himself. No open lesions.  He has no other concerns today. No new concerns.   Objective: AAO x3, NAD DP/PT pulses palpable bilaterally, CRT less than 3 seconds Sensation decreased. Digital contractures noted.   Nails are hypertrophic, dystrophic, brittle, discolored, elongated 10. No surrounding redness or drainage. Tenderness nails 1-5 bilaterally.  Digital contractures noted.  This resulted in hyperkeratotic lesions to the distal aspect of the fifth toe as well as the right hallux. There are no open lesions present. No signs of infection.  No pain with calf compression, swelling, warmth, erythema  Assessment: Symptomatic onychomycosis; pre-ulcerative calluses  Plan: -All treatment options discussed with the patient including all alternatives, risks, complications.  -Sharply debrided nails x 10 without any complications or bleeding. -Debrided hyperkeratotic lesions without any complications or bleeding x 2 (lateral 5th toe and right hallux).  Moisturizer, offloading. - Daily foot inspection.  Return in about 3 months (around 06/16/2024) for nail/callus trim.  Donnice JONELLE Fees DPM

## 2024-03-24 ENCOUNTER — Ambulatory Visit
Admission: RE | Admit: 2024-03-24 | Discharge: 2024-03-24 | Disposition: A | Source: Ambulatory Visit | Attending: Urology

## 2024-03-24 DIAGNOSIS — D4101 Neoplasm of uncertain behavior of right kidney: Secondary | ICD-10-CM

## 2024-03-26 ENCOUNTER — Other Ambulatory Visit: Payer: Self-pay | Admitting: Urology

## 2024-03-26 DIAGNOSIS — Z85528 Personal history of other malignant neoplasm of kidney: Secondary | ICD-10-CM

## 2024-04-08 ENCOUNTER — Other Ambulatory Visit

## 2024-04-19 ENCOUNTER — Other Ambulatory Visit

## 2024-06-17 ENCOUNTER — Ambulatory Visit: Admitting: Podiatry

## 2024-06-17 ENCOUNTER — Encounter: Payer: Self-pay | Admitting: Podiatry

## 2024-06-17 DIAGNOSIS — M79674 Pain in right toe(s): Secondary | ICD-10-CM

## 2024-06-17 DIAGNOSIS — E1149 Type 2 diabetes mellitus with other diabetic neurological complication: Secondary | ICD-10-CM | POA: Diagnosis not present

## 2024-06-17 DIAGNOSIS — B351 Tinea unguium: Secondary | ICD-10-CM

## 2024-06-17 DIAGNOSIS — L84 Corns and callosities: Secondary | ICD-10-CM | POA: Diagnosis not present

## 2024-06-17 DIAGNOSIS — M79675 Pain in left toe(s): Secondary | ICD-10-CM

## 2024-06-19 NOTE — Progress Notes (Signed)
 Subjective: Chief Complaint  Patient presents with   Diabetes    I'm here for the usual maintenance, trim corns and calluses.  Saw Harlene Easly - 01/12/2024; A1c 31.58      74 year old male presents the office today for concerns of a callus and for elongated nails is not able to trim himself.  He does not report any ulcerations.  No other concerns.  Objective: AAO x3, NAD DP/PT pulses palpable bilaterally, CRT less than 3 seconds Sensation decreased. Digital contractures noted bilaterally. Nails are hypertrophic, dystrophic, brittle, discolored, elongated 10. No surrounding redness or drainage. Tenderness nails 1-5 bilaterally.  Hyperkeratotic lesions to the distal aspect of the fifth toe as well as the right hallux. There are no open lesions present. No signs of infection.  No pain with calf compression, swelling, warmth, erythema  Assessment: Symptomatic onychomycosis; pre-ulcerative calluses  Plan: -All treatment options discussed with the patient including all alternatives, risks, complications.  -Sharply debrided nails x 10 without any complications or bleeding. -Debrided hyperkeratotic lesions without any complications or bleeding x 2 (lateral 5th toe and right hallux).  Moisturizer, offloading. - Daily foot inspection.  Return in about 3 months (around 09/15/2024).  Donnice JONELLE Fees DPM

## 2024-07-02 ENCOUNTER — Other Ambulatory Visit

## 2024-09-16 ENCOUNTER — Ambulatory Visit: Admitting: Podiatry

## 2024-11-25 ENCOUNTER — Ambulatory Visit: Admitting: Adult Health
